# Patient Record
Sex: Female | Born: 1935 | Race: White | Hispanic: No | Smoking: Former smoker
Health system: Southern US, Community
[De-identification: ages and names within clinical notes are randomized; demographics above are authoritative.]

## PROBLEM LIST (undated history)

## (undated) DIAGNOSIS — C50919 Malignant neoplasm of unspecified site of unspecified female breast: Secondary | ICD-10-CM

## (undated) DIAGNOSIS — M1990 Unspecified osteoarthritis, unspecified site: Secondary | ICD-10-CM

## (undated) DIAGNOSIS — K219 Gastro-esophageal reflux disease without esophagitis: Secondary | ICD-10-CM

## (undated) DIAGNOSIS — K589 Irritable bowel syndrome without diarrhea: Secondary | ICD-10-CM

## (undated) DIAGNOSIS — Z9109 Other allergy status, other than to drugs and biological substances: Secondary | ICD-10-CM

## (undated) DIAGNOSIS — Z923 Personal history of irradiation: Secondary | ICD-10-CM

## (undated) DIAGNOSIS — C50912 Malignant neoplasm of unspecified site of left female breast: Secondary | ICD-10-CM

## (undated) DIAGNOSIS — M810 Age-related osteoporosis without current pathological fracture: Secondary | ICD-10-CM

## (undated) DIAGNOSIS — C50511 Malignant neoplasm of lower-outer quadrant of right female breast: Secondary | ICD-10-CM

## (undated) DIAGNOSIS — E039 Hypothyroidism, unspecified: Secondary | ICD-10-CM

## (undated) HISTORY — DX: Age-related osteoporosis without current pathological fracture: M81.0

## (undated) HISTORY — DX: Malignant neoplasm of unspecified site of left female breast: C50.912

## (undated) HISTORY — DX: Malignant neoplasm of lower-outer quadrant of right female breast: C50.511

## (undated) HISTORY — PX: CATARACT EXTRACTION: SUR2

## (undated) HISTORY — DX: Irritable bowel syndrome, unspecified: K58.9

## (undated) HISTORY — PX: JOINT REPLACEMENT: SHX530

## (undated) HISTORY — PX: ABDOMINAL HYSTERECTOMY: SHX81

## (undated) HISTORY — DX: Unspecified osteoarthritis, unspecified site: M19.90

---

## 1999-12-06 HISTORY — PX: OVARY SURGERY: SHX727

## 2010-10-21 ENCOUNTER — Ambulatory Visit: Payer: Self-pay | Admitting: Internal Medicine

## 2011-12-06 DIAGNOSIS — Z923 Personal history of irradiation: Secondary | ICD-10-CM

## 2011-12-06 DIAGNOSIS — C50919 Malignant neoplasm of unspecified site of unspecified female breast: Secondary | ICD-10-CM

## 2011-12-06 HISTORY — DX: Malignant neoplasm of unspecified site of unspecified female breast: C50.919

## 2011-12-06 HISTORY — PX: BREAST SURGERY: SHX581

## 2011-12-06 HISTORY — DX: Personal history of irradiation: Z92.3

## 2011-12-06 HISTORY — PX: BREAST LUMPECTOMY: SHX2

## 2012-01-05 ENCOUNTER — Ambulatory Visit: Payer: Self-pay | Admitting: Ophthalmology

## 2012-01-05 DIAGNOSIS — I499 Cardiac arrhythmia, unspecified: Secondary | ICD-10-CM

## 2012-01-16 ENCOUNTER — Ambulatory Visit: Payer: Self-pay | Admitting: Ophthalmology

## 2012-02-03 ENCOUNTER — Ambulatory Visit: Payer: Self-pay | Admitting: Internal Medicine

## 2012-02-13 ENCOUNTER — Ambulatory Visit: Payer: Self-pay | Admitting: Internal Medicine

## 2012-04-18 ENCOUNTER — Ambulatory Visit: Payer: Self-pay | Admitting: Radiation Oncology

## 2012-05-04 LAB — CBC CANCER CENTER
Basophil %: 0.7 %
HCT: 39.4 % (ref 35.0–47.0)
HGB: 12.8 g/dL (ref 12.0–16.0)
Lymphocyte #: 0.9 x10 3/mm — ABNORMAL LOW (ref 1.0–3.6)
Lymphocyte %: 19.9 %
MCH: 29.2 pg (ref 26.0–34.0)
MCHC: 32.5 g/dL (ref 32.0–36.0)
Monocyte %: 7.8 %
Neutrophil #: 3.2 x10 3/mm (ref 1.4–6.5)
Neutrophil %: 68.1 %
Platelet: 108 x10 3/mm — ABNORMAL LOW (ref 150–440)
RBC: 4.38 10*6/uL (ref 3.80–5.20)

## 2012-05-05 ENCOUNTER — Ambulatory Visit: Payer: Self-pay | Admitting: Radiation Oncology

## 2012-05-11 LAB — CBC CANCER CENTER
Basophil #: 0.1 x10 3/mm (ref 0.0–0.1)
Basophil %: 1.5 %
Eosinophil #: 0.2 x10 3/mm (ref 0.0–0.7)
Eosinophil %: 4.5 %
HCT: 40.8 % (ref 35.0–47.0)
HGB: 13.1 g/dL (ref 12.0–16.0)
Lymphocyte %: 20.9 %
MCHC: 32 g/dL (ref 32.0–36.0)
Monocyte %: 9.7 %
Neutrophil #: 3 x10 3/mm (ref 1.4–6.5)
Platelet: 110 x10 3/mm — ABNORMAL LOW (ref 150–440)

## 2012-05-18 LAB — CBC CANCER CENTER
Basophil #: 0.1 x10 3/mm (ref 0.0–0.1)
HCT: 39.3 % (ref 35.0–47.0)
HGB: 12.9 g/dL (ref 12.0–16.0)
Lymphocyte #: 0.6 x10 3/mm — ABNORMAL LOW (ref 1.0–3.6)
Lymphocyte %: 13.4 %
MCH: 29.5 pg (ref 26.0–34.0)
MCHC: 32.9 g/dL (ref 32.0–36.0)
Neutrophil #: 3.6 x10 3/mm (ref 1.4–6.5)
Neutrophil %: 74.7 %
Platelet: 109 x10 3/mm — ABNORMAL LOW (ref 150–440)
WBC: 4.8 x10 3/mm (ref 3.6–11.0)

## 2012-05-25 LAB — CBC CANCER CENTER
Basophil #: 0.1 x10 3/mm (ref 0.0–0.1)
Basophil %: 1.2 %
Eosinophil %: 2.3 %
Lymphocyte #: 0.9 x10 3/mm — ABNORMAL LOW (ref 1.0–3.6)
Lymphocyte %: 19.6 %
MCH: 28.9 pg (ref 26.0–34.0)
MCHC: 32 g/dL (ref 32.0–36.0)
MCV: 90 fL (ref 80–100)
Monocyte #: 0.4 x10 3/mm (ref 0.2–0.9)
Monocyte %: 9.4 %
Neutrophil #: 3 x10 3/mm (ref 1.4–6.5)
Neutrophil %: 67.5 %
RBC: 4.46 10*6/uL (ref 3.80–5.20)
RDW: 15 % — ABNORMAL HIGH (ref 11.5–14.5)

## 2012-06-01 LAB — CBC CANCER CENTER
Basophil #: 0 x10 3/mm (ref 0.0–0.1)
Eosinophil #: 0.1 x10 3/mm (ref 0.0–0.7)
Eosinophil %: 2.1 %
HCT: 39.7 % (ref 35.0–47.0)
HGB: 12.9 g/dL (ref 12.0–16.0)
Lymphocyte #: 0.6 x10 3/mm — ABNORMAL LOW (ref 1.0–3.6)
Lymphocyte %: 14.8 %
MCH: 29.1 pg (ref 26.0–34.0)
MCHC: 32.5 g/dL (ref 32.0–36.0)
MCV: 90 fL (ref 80–100)
Monocyte #: 0.4 x10 3/mm (ref 0.2–0.9)
Monocyte %: 9 %
Platelet: 101 x10 3/mm — ABNORMAL LOW (ref 150–440)
RBC: 4.43 10*6/uL (ref 3.80–5.20)
RDW: 14.8 % — ABNORMAL HIGH (ref 11.5–14.5)
WBC: 4.1 x10 3/mm (ref 3.6–11.0)

## 2012-06-04 ENCOUNTER — Ambulatory Visit: Payer: Self-pay | Admitting: Radiation Oncology

## 2012-06-08 LAB — CBC CANCER CENTER
Basophil #: 0 x10 3/mm (ref 0.0–0.1)
HCT: 40.2 % (ref 35.0–47.0)
HGB: 12.8 g/dL (ref 12.0–16.0)
Lymphocyte #: 0.6 x10 3/mm — ABNORMAL LOW (ref 1.0–3.6)
MCH: 28.9 pg (ref 26.0–34.0)
MCV: 91 fL (ref 80–100)
Monocyte #: 0.4 x10 3/mm (ref 0.2–0.9)
Neutrophil #: 3.4 x10 3/mm (ref 1.4–6.5)
Neutrophil %: 73.6 %
Platelet: 96 x10 3/mm — ABNORMAL LOW (ref 150–440)
RBC: 4.43 10*6/uL (ref 3.80–5.20)
RDW: 15.3 % — ABNORMAL HIGH (ref 11.5–14.5)
WBC: 4.6 x10 3/mm (ref 3.6–11.0)

## 2012-07-05 ENCOUNTER — Ambulatory Visit: Payer: Self-pay | Admitting: Radiation Oncology

## 2012-08-05 ENCOUNTER — Ambulatory Visit: Payer: Self-pay | Admitting: Oncology

## 2012-09-04 ENCOUNTER — Ambulatory Visit: Payer: Self-pay

## 2012-09-04 ENCOUNTER — Ambulatory Visit: Payer: Self-pay | Admitting: Radiation Oncology

## 2012-11-15 ENCOUNTER — Ambulatory Visit: Payer: Self-pay | Admitting: Oncology

## 2012-11-19 ENCOUNTER — Ambulatory Visit: Payer: Self-pay | Admitting: Oncology

## 2012-11-19 LAB — CBC CANCER CENTER
Basophil #: 0 x10 3/mm (ref 0.0–0.1)
HCT: 38 % (ref 35.0–47.0)
HGB: 12.7 g/dL (ref 12.0–16.0)
Lymphocyte #: 0.7 x10 3/mm — ABNORMAL LOW (ref 1.0–3.6)
Lymphocyte %: 17 %
MCH: 29.6 pg (ref 26.0–34.0)
MCHC: 33.4 g/dL (ref 32.0–36.0)
MCV: 89 fL (ref 80–100)
Monocyte #: 0.3 x10 3/mm (ref 0.2–0.9)
Monocyte %: 6.6 %
Neutrophil #: 3.2 x10 3/mm (ref 1.4–6.5)
Platelet: 150 x10 3/mm (ref 150–440)
RBC: 4.29 10*6/uL (ref 3.80–5.20)
RDW: 14.4 % (ref 11.5–14.5)

## 2012-11-19 LAB — COMPREHENSIVE METABOLIC PANEL
Albumin: 3.6 g/dL (ref 3.4–5.0)
Alkaline Phosphatase: 78 U/L (ref 50–136)
Anion Gap: 9 (ref 7–16)
BUN: 11 mg/dL (ref 7–18)
Bilirubin,Total: 0.3 mg/dL (ref 0.2–1.0)
Calcium, Total: 9.3 mg/dL (ref 8.5–10.1)
Co2: 30 mmol/L (ref 21–32)
Glucose: 113 mg/dL — ABNORMAL HIGH (ref 65–99)
Osmolality: 280 (ref 275–301)
Potassium: 4 mmol/L (ref 3.5–5.1)
SGOT(AST): 16 U/L (ref 15–37)
SGPT (ALT): 21 U/L (ref 12–78)
Total Protein: 7.5 g/dL (ref 6.4–8.2)

## 2012-12-05 ENCOUNTER — Ambulatory Visit: Payer: Self-pay | Admitting: Oncology

## 2013-02-07 ENCOUNTER — Ambulatory Visit: Payer: Self-pay | Admitting: Oncology

## 2013-05-05 ENCOUNTER — Ambulatory Visit: Payer: Self-pay | Admitting: Oncology

## 2013-05-20 LAB — CBC CANCER CENTER
Basophil #: 0 x10 3/mm (ref 0.0–0.1)
Basophil %: 1.2 %
Eosinophil #: 0.1 x10 3/mm (ref 0.0–0.7)
HGB: 13.8 g/dL (ref 12.0–16.0)
Lymphocyte #: 0.8 x10 3/mm — ABNORMAL LOW (ref 1.0–3.6)
MCHC: 33.7 g/dL (ref 32.0–36.0)
Monocyte %: 8.7 %
Neutrophil #: 2.4 x10 3/mm (ref 1.4–6.5)
WBC: 3.6 x10 3/mm (ref 3.6–11.0)

## 2013-05-20 LAB — BASIC METABOLIC PANEL
Anion Gap: 4 — ABNORMAL LOW (ref 7–16)
Calcium, Total: 9.4 mg/dL (ref 8.5–10.1)
Co2: 32 mmol/L (ref 21–32)
EGFR (African American): >60
EGFR (Non-African Amer.): 55 — ABNORMAL LOW
Osmolality: 273 (ref 275–301)
Potassium: 4.3 mmol/L (ref 3.5–5.1)

## 2013-06-04 ENCOUNTER — Ambulatory Visit: Payer: Self-pay | Admitting: Oncology

## 2013-11-12 ENCOUNTER — Ambulatory Visit: Payer: Self-pay | Admitting: Hematology and Oncology

## 2013-11-12 LAB — CBC CANCER CENTER
Basophil #: 0.1 x10 3/mm (ref 0.0–0.1)
Basophil %: 1.1 %
Eosinophil #: 0.2 x10 3/mm (ref 0.0–0.7)
Eosinophil %: 2.4 %
HGB: 13.7 g/dL (ref 12.0–16.0)
MCH: 30.3 pg (ref 26.0–34.0)
MCV: 92 fL (ref 80–100)
Monocyte #: 0.6 x10 3/mm (ref 0.2–0.9)
Monocyte %: 8.4 %
Neutrophil %: 75.6 %
Platelet: 161 x10 3/mm (ref 150–440)
RDW: 14.7 % — ABNORMAL HIGH (ref 11.5–14.5)

## 2013-11-12 LAB — COMPREHENSIVE METABOLIC PANEL
Anion Gap: 7 (ref 7–16)
Bilirubin,Total: 0.3 mg/dL (ref 0.2–1.0)
Calcium, Total: 9.7 mg/dL (ref 8.5–10.1)
EGFR (African American): >60
EGFR (Non-African Amer.): >60
Glucose: 87 mg/dL (ref 65–99)
Potassium: 4.2 mmol/L (ref 3.5–5.1)
SGOT(AST): 12 U/L — ABNORMAL LOW (ref 15–37)

## 2013-12-05 ENCOUNTER — Ambulatory Visit: Payer: Self-pay | Admitting: Hematology and Oncology

## 2014-02-13 ENCOUNTER — Ambulatory Visit: Payer: Self-pay | Admitting: Hematology and Oncology

## 2014-02-13 ENCOUNTER — Ambulatory Visit: Payer: Self-pay | Admitting: Oncology

## 2014-02-14 ENCOUNTER — Ambulatory Visit: Payer: Self-pay | Admitting: Hematology and Oncology

## 2014-02-15 LAB — CANCER ANTIGEN 27.29: CA 27.29: 17.9 U/mL (ref 0.0–38.6)

## 2014-03-05 ENCOUNTER — Ambulatory Visit: Payer: Self-pay | Admitting: Hematology and Oncology

## 2014-08-12 ENCOUNTER — Ambulatory Visit: Payer: Self-pay | Admitting: Ophthalmology

## 2014-08-18 ENCOUNTER — Ambulatory Visit: Payer: Self-pay | Admitting: Ophthalmology

## 2014-09-16 ENCOUNTER — Ambulatory Visit: Payer: Self-pay | Admitting: Oncology

## 2014-09-16 LAB — COMPREHENSIVE METABOLIC PANEL
ALBUMIN: 3.9 g/dL (ref 3.4–5.0)
Alkaline Phosphatase: 71 U/L
Anion Gap: 6 — ABNORMAL LOW (ref 7–16)
BILIRUBIN TOTAL: 0.5 mg/dL (ref 0.2–1.0)
BUN: 16 mg/dL (ref 7–18)
CO2: 31 mmol/L (ref 21–32)
CREATININE: 0.92 mg/dL (ref 0.60–1.30)
Calcium, Total: 10 mg/dL (ref 8.5–10.1)
Chloride: 103 mmol/L (ref 98–107)
EGFR (African American): >60
EGFR (Non-African Amer.): >60
GLUCOSE: 94 mg/dL (ref 65–99)
OSMOLALITY: 280 (ref 275–301)
Potassium: 4.7 mmol/L (ref 3.5–5.1)
SGOT(AST): 21 U/L (ref 15–37)
SGPT (ALT): 30 U/L
Sodium: 140 mmol/L (ref 136–145)
Total Protein: 7.3 g/dL (ref 6.4–8.2)

## 2014-09-16 LAB — CBC CANCER CENTER
BASOS ABS: 0.1 x10 3/mm (ref 0.0–0.1)
Basophil %: 2.4 %
EOS ABS: 0.1 x10 3/mm (ref 0.0–0.7)
Eosinophil %: 3 %
HCT: 41.7 % (ref 35.0–47.0)
HGB: 13.5 g/dL (ref 12.0–16.0)
LYMPHS ABS: 0.9 x10 3/mm — AB (ref 1.0–3.6)
LYMPHS PCT: 23.2 %
MCH: 29.8 pg (ref 26.0–34.0)
MCHC: 32.3 g/dL (ref 32.0–36.0)
MCV: 92 fL (ref 80–100)
MONO ABS: 0.3 x10 3/mm (ref 0.2–0.9)
MONOS PCT: 8.4 %
Neutrophil #: 2.5 x10 3/mm (ref 1.4–6.5)
Neutrophil %: 63 %
Platelet: 130 x10 3/mm — ABNORMAL LOW (ref 150–440)
RBC: 4.52 10*6/uL (ref 3.80–5.20)
RDW: 14.4 % (ref 11.5–14.5)
WBC: 3.9 x10 3/mm (ref 3.6–11.0)

## 2014-09-17 LAB — CANCER ANTIGEN 27.29: CA 27.29: 10.3 U/mL (ref 0.0–38.6)

## 2014-10-05 ENCOUNTER — Ambulatory Visit: Payer: Self-pay | Admitting: Oncology

## 2014-12-05 HISTORY — PX: BREAST LUMPECTOMY: SHX2

## 2015-02-16 ENCOUNTER — Ambulatory Visit: Payer: Self-pay | Admitting: Oncology

## 2015-02-17 ENCOUNTER — Ambulatory Visit: Payer: Self-pay | Admitting: Oncology

## 2015-02-17 HISTORY — PX: BREAST BIOPSY: SHX20

## 2015-02-23 ENCOUNTER — Ambulatory Visit
Admit: 2015-02-23 | Disposition: A | Discharge status: Home / Self Care | Payer: Self-pay | Attending: Oncology | Admitting: Oncology

## 2015-02-24 ENCOUNTER — Encounter: Payer: Self-pay | Admitting: *Deleted

## 2015-02-26 ENCOUNTER — Other Ambulatory Visit: Payer: Medicare Other

## 2015-02-26 ENCOUNTER — Encounter: Payer: Self-pay | Admitting: General Surgery

## 2015-02-26 ENCOUNTER — Ambulatory Visit (INDEPENDENT_AMBULATORY_CARE_PROVIDER_SITE_OTHER): Payer: Medicare Other | Admitting: General Surgery

## 2015-02-26 ENCOUNTER — Other Ambulatory Visit: Payer: Self-pay | Admitting: General Surgery

## 2015-02-26 VITALS — BP 124/70 | HR 70 | Resp 12 | Ht 66.0 in | Wt 142.0 lb

## 2015-02-26 DIAGNOSIS — D0511 Intraductal carcinoma in situ of right breast: Secondary | ICD-10-CM

## 2015-02-26 DIAGNOSIS — N6310 Unspecified lump in the right breast, unspecified quadrant: Secondary | ICD-10-CM

## 2015-02-26 DIAGNOSIS — D0510 Intraductal carcinoma in situ of unspecified breast: Secondary | ICD-10-CM | POA: Insufficient documentation

## 2015-02-26 DIAGNOSIS — C50912 Malignant neoplasm of unspecified site of left female breast: Secondary | ICD-10-CM | POA: Insufficient documentation

## 2015-02-26 NOTE — Progress Notes (Signed)
Patient ID: Aimee Gutierrez, female   DOB: 06-20-1933, 81 y.o.   MRN: [id number]  Chief Complaint  Patient presents with  . Other    breast cancer    HPI Aimee Gutierrez is a 81 y.o. female who presents for a breast evaluation for a newly diagnosis of breast cancer. The most recent mammogram was done on 02-16-15  and a right breast biopsy was done on 02-17-15.  Patient does perform regular self breast checks and gets regular mammograms done.  She has a history of left breast cancer in 2103 with lumpectomy and radiation. She did not feel anything different in the breast prior to her mammogram.  She is here today with her husband of 47 years.   HPI  Past Medical History  Diagnosis Date  . Arthritis   . IBS (irritable bowel syndrome)   . Cancer 2013    with radiation, stage 1    Past Surgical History  Procedure Laterality Date  . Breast surgery Left 2013    [place] extraction  2013, 2015  . Ovary surgery  2001    benign    No family history on file.  Social History History  Substance Use Topics  . Smoking status: Former Smoker    Quit date: 12/05/2009  . Smokeless tobacco: Not on file  . Alcohol Use: No    Allergies  Allergen Reactions  . Sulfa Antibiotics Rash    Current Outpatient Prescriptions  Medication Sig Dispense Refill  . aspirin 81 MG tablet Take 81 mg by mouth daily.    . calcium carbonate (TUMS - DOSED IN MG ELEMENTAL CALCIUM) 500 MG chewable tablet Chew 1 tablet by mouth daily.    . Cholecalciferol (VITAMIN D3) 1000 UNITS CAPS Take by mouth daily.     Marland Kitchen ibuprofen (ADVIL,MOTRIN) 200 MG tablet Take by mouth.    . Probiotic Product (HEALTHY COLON PO) Take by mouth daily.    Marland Kitchen SYNTHROID 75 MCG tablet      No current facility-administered medications for this visit.    Review of Systems Review of Systems  Constitutional: Negative.   Respiratory: Negative.   Cardiovascular: Negative.     Blood pressure 124/70, pulse 70, resp. rate  12, height $RemoveBe'5\' 6"'XuBjwhnRq$  (1.676 m), weight 142 lb (64.411 kg).  Physical Exam Physical Exam  Constitutional: She is oriented to person, place, and time. She appears well-developed and well-nourished.  Neck: Neck supple.  Cardiovascular: Normal rate, regular rhythm and normal heart sounds.   Pulmonary/Chest: Effort normal and breath sounds normal. Right breast exhibits no inverted nipple, no nipple discharge, no skin change and no tenderness. Left breast exhibits no inverted nipple, no mass, no nipple discharge, no skin change and no tenderness.    Lymphadenopathy:    She has no cervical adenopathy.    She has no axillary adenopathy.  Neurological: She is alert and oriented to person, place, and time.  Skin: Skin is warm and dry.    Data Reviewed [place] [facility] dated 02/23/2012 on a left breast needle biopsy showed invasive mammary carcinoma as well as DCIS. Subsequent wide excision completed 03/09/2012 showed a 1 mm invasive carcinoma, ER positive, PR positive. HER-2/neu deferred due to size. Sentinel nodes 2 negative for malignancy.  Recently completed a right breast ultrasound-guided biopsy showed low-grade DCIS, ER 90%, PR 90%. Diagnostic mammograms dated 02/16/2015 suggested a density in the lower quadrant of the right breast. Otherwise negative exam. Ultrasound of the area showed a 2  x 3 x 5 mm oval hypoechoic area that was slightly irregular.  Subsequent ultrasound-guided biopsy dated 02/17/2015 was completed showing DCIS.  Laboratory studies dated 02/23/2015 showed a hemoglobin of 13.5 with an MCV of 89, white blood cell count 4300. Modestly depressed platelet count of 119,000. CA 27-29 normal at 11.4. Comprehensive metabolic panel unremarkable. Normal electrolytes with the exception of a modestly depressed sodium at 134 and chloride at 99. Creatinine 0.8.  Ultrasound examination of the right breast to determine visibility of the biopsy site was completed today. There is a vaguely defined area at  the 4:00 position, 5 cm from the nipple measuring less than 0.5 cm in diameter with posterior acoustic enhancement corresponding to the prebiopsy ultrasound.  Assessment    Low-grade DCIS involving the right breast, ER/PR positive.    Plan    The difference between DCIS and invasive cancer was reviewed, in particular that there is no need to examine lymph glands as was done for her left breast.  She reported that the radiation therapy really drained her, and she would avoid radiation therapy if possible. There is new data that for women in their late 70s with very favorable DCIS, as is the case at this time, that the advantage of radiation may be in the low single digits if the patient does make use of antiestrogen therapy. The pros and cons will be discussed in more detail when the wide excision is completed and confirmation that there is no invasive component is confirmed.  The patient reports that she developed a hematoma after her 2013 exam. A compressive wrap was not utilized. This will be planned at the time of her wide excision. Patient's surgery has been scheduled for 03-09-15 at [facility]. It is okay for patient to continue 81 mg aspirin once daily.     PCP:  Adin Hector Ref: Dr. Luna Glasgow, Kelvin Cellar 02/26/2015, 12:53 PM

## 2015-02-26 NOTE — Patient Instructions (Signed)
Patient's surgery has been scheduled for 03-09-15 at [facility]. It is okay for patient to continue 81 mg aspirin once daily.

## 2015-03-02 ENCOUNTER — Ambulatory Visit: Payer: Self-pay | Admitting: General Surgery

## 2015-03-04 ENCOUNTER — Ambulatory Visit: Payer: Self-pay | Admitting: General Surgery

## 2015-03-06 ENCOUNTER — Ambulatory Visit
Admit: 2015-03-06 | Disposition: A | Discharge status: Home / Self Care | Payer: Self-pay | Attending: Oncology | Admitting: Oncology

## 2015-03-09 ENCOUNTER — Ambulatory Visit
Admit: 2015-03-09 | Disposition: A | Discharge status: Home / Self Care | Payer: Self-pay | Attending: General Surgery | Admitting: General Surgery

## 2015-03-09 ENCOUNTER — Ambulatory Visit: Payer: Medicare Other | Admitting: General Surgery

## 2015-03-09 DIAGNOSIS — C50511 Malignant neoplasm of lower-outer quadrant of right female breast: Secondary | ICD-10-CM

## 2015-03-09 DIAGNOSIS — C50912 Malignant neoplasm of unspecified site of left female breast: Secondary | ICD-10-CM

## 2015-03-09 HISTORY — PX: BREAST SURGERY: SHX581

## 2015-03-09 HISTORY — DX: Malignant neoplasm of lower-outer quadrant of right female breast: C50.511

## 2015-03-11 ENCOUNTER — Telehealth: Payer: Self-pay | Admitting: General Surgery

## 2015-03-11 ENCOUNTER — Encounter: Payer: Self-pay | Admitting: General Surgery

## 2015-03-11 NOTE — Telephone Encounter (Signed)
Notified of path: 3. 5 mm invasive mammary cancer noted. Will review at office visit.

## 2015-03-12 ENCOUNTER — Encounter: Payer: Self-pay | Admitting: General Surgery

## 2015-03-12 ENCOUNTER — Ambulatory Visit (INDEPENDENT_AMBULATORY_CARE_PROVIDER_SITE_OTHER): Payer: Medicare Other | Admitting: General Surgery

## 2015-03-12 VITALS — BP 116/68 | HR 68 | Resp 12 | Ht 66.0 in | Wt 130.0 lb

## 2015-03-12 DIAGNOSIS — C50912 Malignant neoplasm of unspecified site of left female breast: Secondary | ICD-10-CM

## 2015-03-12 DIAGNOSIS — C50911 Malignant neoplasm of unspecified site of right female breast: Secondary | ICD-10-CM

## 2015-03-12 DIAGNOSIS — D0511 Intraductal carcinoma in situ of right breast: Secondary | ICD-10-CM

## 2015-03-12 NOTE — Progress Notes (Signed)
Patient ID: Aimee Gutierrez, female   DOB: 02/20/1933, 81 y.o.   MRN: [id number]  Chief Complaint  Patient presents with  . Routine Post Op    right breast wide excision     HPI Aimee Gutierrez is a 81 y.o. female here today for her post op right breast wide excision done on 03/09/15. Patient states she is doing well at this time . The patient had been contacted by phone with results of her biopsy showing a 3.5 mm foci of invasive ductal carcinoma. She was accompanied today for the post exam interview by her husband, son and daughter. HPI  Past Medical History  Diagnosis Date  . Arthritis   . IBS (irritable bowel syndrome)   . Cancer 2013    with radiation, stage 1    Past Surgical History  Procedure Laterality Date  . Cataract extraction  2013, 2015  . Ovary surgery  2001    benign  . Breast surgery Left 2013    [facility]  . Breast surgery Right 03/09/15    wide excision    No family history on file.  Social History History  Substance Use Topics  . Smoking status: Former Smoker    Quit date: 12/05/2009  . Smokeless tobacco: Not on file  . Alcohol Use: No    Allergies  Allergen Reactions  . Sulfa Antibiotics Rash    Current Outpatient Prescriptions  Medication Sig Dispense Refill  . aspirin 81 MG tablet Take 81 mg by mouth daily.    . calcium carbonate (TUMS - DOSED IN MG ELEMENTAL CALCIUM) 500 MG chewable tablet Chew 1 tablet by mouth daily.    . Cholecalciferol (VITAMIN D3) 1000 UNITS CAPS Take by mouth daily.     Marland Kitchen ibuprofen (ADVIL,MOTRIN) 200 MG tablet Take by mouth.    . Probiotic Product (HEALTHY COLON PO) Take by mouth daily.    Marland Kitchen SYNTHROID 75 MCG tablet      No current facility-administered medications for this visit.    Review of Systems Review of Systems  Constitutional: Negative.   Respiratory: Negative.   Cardiovascular: Negative.     Blood pressure 116/68, pulse 68, resp. rate 12, height 5\' 6"  (1.676 m), weight 130 lb (58.968 kg).  Physical  Exam Physical Exam  Constitutional: She is oriented to person, place, and time. She appears well-developed and well-nourished.  Eyes: Conjunctivae are normal. No scleral icterus.  Neck: Neck supple.  Cardiovascular: Normal rate, regular rhythm and normal heart sounds.   Pulmonary/Chest:    Right breast incision is clean and healing well.   Lymphadenopathy:    She has no cervical adenopathy.  Neurological: She is alert and oriented to person, place, and time.  Skin: Skin is warm and dry.    Data Reviewed Pathology showed all margins clear. 3.5 mm invasive mammary carcinoma as incidental finding.  Assessment    Stage I, T1a carcinoma of the right breast    Plan    This patient is scheduled to see Dr. Mike Gip and Dr. Baruch Gouty at the [facility] on Wednesday, 03-18-15 at 9 am.  The patient had been reluctant to consider radiation therapy, attributing multiple arthralgias and myalgias to this 3 years ago when she was treated for right breast. We clarified that she did very well with the actual breast radiation and its highly unlikely that she was experiencing lower extremity arthralgias from left chest wall radiation.  She is ambivalent about radiation therapy, and considering her age I  can understand this. She does seem to have a fairly good 8-10 year life expectancy. She declined antiestrogen therapy 3 years ago, and I strongly encouraged her to reassess this decision now. She is afraid about myalgias, and this is certainly a common side effect with aromatase inhibitors. If she failed this she could be considered for tamoxifen therapy recognizing that there is a small risk of DVT not present with aromatase inhibitors.  The patient has been encouraged to make use of a bra day and night or support. We'll plan for reassessment and review of the radiation and medical oncology physicians consultation at her follow-up visit.     PCP:  Arther Dames, Trecia Rogers 03/13/2015, 9:21 PM

## 2015-03-12 NOTE — Patient Instructions (Addendum)
Patient to return in one week.   This patient is scheduled to see Dr. Mike Gip and Dr. Baruch Gouty at the [facility] on Wednesday, 03-18-15 at 9 am.

## 2015-03-13 DIAGNOSIS — C50911 Malignant neoplasm of unspecified site of right female breast: Secondary | ICD-10-CM | POA: Insufficient documentation

## 2015-03-18 ENCOUNTER — Encounter: Payer: Self-pay | Admitting: General Surgery

## 2015-03-18 ENCOUNTER — Ambulatory Visit (INDEPENDENT_AMBULATORY_CARE_PROVIDER_SITE_OTHER): Payer: Medicare Other | Admitting: General Surgery

## 2015-03-18 VITALS — BP 120/66 | HR 90 | Resp 16 | Ht 66.0 in | Wt 139.0 lb

## 2015-03-18 DIAGNOSIS — C50911 Malignant neoplasm of unspecified site of right female breast: Secondary | ICD-10-CM

## 2015-03-18 DIAGNOSIS — D0511 Intraductal carcinoma in situ of right breast: Secondary | ICD-10-CM

## 2015-03-18 NOTE — Patient Instructions (Addendum)
The patient is aware to call back for any questions or concerns. Continue self breast exams. Call office for any new breast issues or concerns. Follow up 2 months. Gradually increase physical activity.

## 2015-03-18 NOTE — Progress Notes (Signed)
Patient ID: Aimee Gutierrez, female   DOB: 07/19/1933, 81 y.o.   MRN: [id number]  Chief Complaint  Patient presents with  . Routine Post Op    right breast wide excision    HPI Aimee Gutierrez is a 81 y.o. female.  here today for her post op right breast wide excision done on 03/09/15. Patient states she is doing well at this time. Since her last visit she did have a chance to meet with radiation oncology, but has yet to touch base with the medical oncologist. An appointment with Melissa Corcoran, M.D. is scheduled for next week.  She reports that her last bone density showed a value of -3.  She has an appointment for radiation simulation on 03-30-15.   HPI  Past Medical History  Diagnosis Date  . Arthritis   . IBS (irritable bowel syndrome)   . Cancer 2013    with radiation, stage 1  . Breast cancer of lower-inner quadrant of right female breast 03/09/2015    3.5 mm invasive carcinoma, low-grade DCIS. ER 90%, PR greater than 50%, HER-2/neu not amplified.    Past Surgical History  Procedure Laterality Date  . Cataract extraction  2013, 2015  . Ovary surgery  2001    benign  . Breast surgery Left 2013    [facility]  . Breast surgery Right 03/09/15    wide excision    No family history on file.  Social History History  Substance Use Topics  . Smoking status: Former Smoker    Quit date: 12/05/2009  . Smokeless tobacco: Not on file  . Alcohol Use: No    Allergies  Allergen Reactions  . Sulfa Antibiotics Rash    Current Outpatient Prescriptions  Medication Sig Dispense Refill  . aspirin 81 MG tablet Take 81 mg by mouth daily.    . calcium carbonate (TUMS - DOSED IN MG ELEMENTAL CALCIUM) 500 MG chewable tablet Chew 1 tablet by mouth daily.    . Cholecalciferol (VITAMIN D3) 1000 UNITS CAPS Take by mouth daily.     . ibuprofen (ADVIL,MOTRIN) 200 MG tablet Take by mouth.    . Probiotic Product (HEALTHY COLON PO) Take by mouth daily.    . SYNTHROID 75 MCG tablet      No  current facility-administered medications for this visit.    Review of Systems Review of Systems  Blood pressure 120/66, pulse 90, resp. rate 16, height 5' 6" (1.676 m), weight 139 lb (63.05 kg).  Physical Exam Physical Exam  Constitutional: She is oriented to person, place, and time. She appears well-developed and well-nourished.  Pulmonary/Chest:    Well healed incision right breast with minimal bruising.  Neurological: She is alert and oriented to person, place, and time.  Skin: Skin is warm and dry.    Data Reviewed T1a, Nx carcinoma of the right breast.  Assessment    Doing well status post wide excision.     Plan    The only question as well as the patient would benefit from a sentinel node biopsy. The likelihood of axillary metastatic disease with a 3.5 mm primary is approximately 5%. The patient is going to be receiving whole breast radiation, and I find it unlikely that adjuvant chemotherapy would be recommended if a positive sentinel node was identified.  Will await formal consultation from Melissa Corcoran, M.D. in this regard.  The patient has artery been identified with osteoporosis, but has been resistant to making use of pharmacologic measures to improve her   density. She is using calcium supplements. Ideally, she would be treated with an aromatase inhibitor after radiation therapy. This may accelerate her bone loss. She had been offered the use of Fosamax in the past by her primary physician, but had declined. She has been asked to reconsider this decision. Certainly, she could be treated with tamoxifen which does support her bone density without the need for an additional medication. The slightly lower efficacy of tamoxifen as opposed to Femara or one of the other AI agents was discussed.  If node biopsy is not required, we'll arrange for follow-up examination in 2 months which should be just about the time she completes her radiation therapy.    Follow up 2  months. Gradually increase physical activity.      PCP:  Klein III, Bert  Byrnett, Jeffrey W 03/21/2015, 11:11 AM    

## 2015-03-21 ENCOUNTER — Encounter: Payer: Self-pay | Admitting: General Surgery

## 2015-03-24 ENCOUNTER — Ambulatory Visit: Payer: Medicare Other | Admitting: General Surgery

## 2015-03-28 NOTE — Op Note (Signed)
PATIENT NAME:  Aimee Gutierrez, Aimee Gutierrez MR#:  [id number] DATE OF BIRTH:  May 13, 1933  DATE OF PROCEDURE:  08/18/2014  PREOPERATIVE DIAGNOSIS: Cataract right eye.   POSTOPERATIVE DIAGNOSIS: Cataract right eye.   PROCEDURE PERFORMED: Extracapsular cataract extraction using phacoemulsification with placement Alcon SN6CWS 23.5 diopter posterior chamber lens, serial number [id number].   ANESTHESIA: 4% lidocaine and 0.75% Marcaine, a 50-50 mixture with 10 units/mL of Hylenex added given as a peribulbar.   ANESTHESIOLOGIST: Dr. Carolin Sicks.   COMPLICATIONS: None.   ESTIMATED BLOOD LOSS: Less than 1 mL.   DESCRIPTION OF PROCEDURE:  The patient was brought to the operating room and given a peribulbar block.  The patient was then prepped and draped in the usual fashion.  The vertical rectus muscles were imbricated using 5-0 silk sutures.  These sutures were then clamped to the sterile drapes as bridle sutures.  A limbal peritomy was performed extending two clock hours and hemostasis was obtained with cautery.  A partial thickness scleral groove was made at the surgical limbus and dissected anteriorly in a lamellar dissection using an Alcon crescent knife.  The anterior chamber was entered superonasally with a Superblade and through the lamellar dissection with a 2.6 mm keratome.  DisCoVisc was used to replace the aqueous and a continuous tear capsulorrhexis was carried out.  Hydrodissection and hydrodelineation were carried out with balanced salt and a 27 gauge canula.  The nucleus was rotated to confirm the effectiveness of the hydrodissection.  Phacoemulsification was carried out using a divide-and-conquer technique.  Total ultrasound time was 1 minute, 41 seconds with an average power of 26.7 percent. CD of 42.07.  Irrigation/aspiration was used to remove the residual cortex.  DisCoVisc was used to inflate the capsule and the internal incision was enlarged to 3 mm with the crescent knife.  The intraocular lens  was folded and inserted into the capsular bag using the AcrySert delivery system.  Irrigation/aspiration was used to remove the residual DisCoVisc.  Miostat was injected into the anterior chamber through the paracentesis track to inflate the anterior chamber and induce miosis.  0.1 mL cefuroxime was injected via the paracentesis tract containing 1 mg of drug. The wound was checked for leaks and none were found. The conjunctiva was closed with cautery and the bridle sutures were removed.  Two drops of 0.3% Vigamox were placed on the eye.   An eye shield was placed on the eye.   ____________________________ Loura Back. Kenidi Elenbaas, MD sad:bu D: 08/18/2014 14:15:16 ET T: 08/18/2014 14:38:51 ET JOB#: [id number]  cc: Remo Lipps A. Dwanna Goshert, MD, <Dictator> Martie Lee MD ELECTRONICALLY SIGNED 08/25/2014 13:59

## 2015-03-29 NOTE — Op Note (Signed)
PATIENT NAME:  Aimee Gutierrez, Aimee Gutierrez MR#:  [id number] DATE OF BIRTH:  [birth year]  DATE OF PROCEDURE:  01/16/2012  PREOPERATIVE DIAGNOSIS:  Cataract, left eye.    POSTOPERATIVE DIAGNOSIS:  Cataract, left eye.  PROCEDURE PERFORMED:  Extracapsular cataract extraction using phacoemulsification with placement of an Alcon SN6CWS 23.5-diopter posterior chamber lens, serial # [id number].  SURGEON:  Loura Back. Charnay Nazario, MD  ASSISTANT:  None.  ANESTHESIA:  4% lidocaine and 0.75% Marcaine in a 50/50 mixture with 10 units/mL of Hylenex added, given as a peribulbar.  ANESTHESIOLOGIST:  Dr. Benjamine Mola.   COMPLICATIONS:  None.  ESTIMATED BLOOD LOSS:  Less than 1 mL.  DESCRIPTION OF PROCEDURE:  The patient was brought to the operating room and given a peribulbar block.  The patient was then prepped and draped in the usual fashion.  The vertical rectus muscles were imbricated using 5-0 silk sutures.  These sutures were then clamped to the sterile drapes as bridle sutures.  A limbal peritomy was performed extending two clock hours and hemostasis was obtained with cautery.  A partial thickness scleral groove was made at the surgical limbus and dissected anteriorly in a lamellar dissection using an Alcon crescent knife.  The anterior chamber was entered supero-temporally with a Superblade and through the lamellar dissection with a 2.6 mm keratome.  DisCoVisc was used to replace the aqueous and a continuous tear capsulorrhexis was carried out.  Hydrodissection and hydrodelineation were carried out with balanced salt and a 27 gauge canula.  The nucleus was rotated to confirm the effectiveness of the hydrodissection.  Phacoemulsification was carried out using a divide-and-conquer technique.  Total ultrasound time was 1 minute and 39 seconds with an average power of 22.8 percent.  Irrigation/aspiration was used to remove the residual cortex.  DisCoVisc was used to inflate the capsule and the internal incision was enlarged  to 3 mm with the crescent knife.  The intraocular lens was folded and inserted into the capsular bag using the AcrySert delivery system.  Irrigation/aspiration was used to remove the residual DisCoVisc.  Miostat was injected into the anterior chamber through the paracentesis track to inflate the anterior chamber and induce miosis.  The wound was checked for leaks and none were found. The conjunctiva was closed with cautery and the bridle sutures were removed.  Two drops of 0.3% Vigamox were placed on the eye.   An eye shield was placed on the eye.  The patient was discharged to the recovery room in good condition.  ____________________________ Loura Back Add Dinapoli, MD sad:cms D: 01/16/2012 13:05:18 ET T: 01/16/2012 13:43:12 ET JOB#: [id number]  cc: Remo Lipps A. Carlyle Mcelrath, MD, <Dictator> Martie Lee MD ELECTRONICALLY SIGNED 01/23/2012 13:31

## 2015-03-29 NOTE — Consult Note (Signed)
Reason for Visit: This 81 year old Female patient presents to the clinic for initial evaluation of  Breast cancer .   Referred by Dr. Collier Bullock, [facility].  Diagnosis:   Chief Complaint/Diagnosis   81 year old female with wide local excision and sentinel node biopsy for a pathologic stage I (T1 A. N0 M0) invasive mammary carcinoma ER/PR positive HER-2/neu not performed   Pathology Report Pathology report reviewed    Imaging Report Mammograms reviewed    Planned Treatment Regimen Adjuvant radiation therapy plus aromatase inhibitor    HPI   patient is a 81 year old female who presents with an abnormal mammogram showing a 7 mm solid lesion in the left breast. Core biopsy was performed showing ER/PR positive invasive mammary carcinoma. She underwent wide local excision with less than 1 mm focus of residual invasive mammary carcinoma. There was ductal carcinoma in situ present. Tumor was overall grade 1. 2 sentinel lymph nodes were examined and negative for metastatic disease. Tumor again was ER/PR positive HER-2/neu not tested based on the small tissue sample. She been seen by medical oncology and recommendation for postop radiation therapy was made. There also considering aromatase inhibitor after completion of radiation. She is presently doing well. She is healed well from her incision. She is having no breast tenderness cough or bone pain at this time.d  Past Hx:    Hypothyroidism: Nov 2011   Cataracts: Feb 2013   Arthritis: 2011   Lumpectomy: 09-Mar-2012   Hysterectomy - Partial: 1969   Hysterectomy - Total: 2001   Cataract Extraction: Feb 2013  Past, Family and Social History:   Past Medical History positive    Gastrointestinal Diverticulosis    Endocrine hypothyroidism    Past Surgical History Hysterectomy, cataract excision    Family History positive    Family History Comments Father with cardiovascular heart disease , mother with history of  heart disease sister with a history of dementia, brother with a history of heart disease    Social History noncontributory    Additional Past Medical and Surgical History Seen accompanied by nurse navigator today   Allergies:   Sulfa drugs: Unknown  Home Meds:  Home Medications: Medication Instructions Status  Synthroid 50 mcg (0.05 mg) oral tablet 1 tab(s) orally once a day (in the morning) Active  Vitamin D3 1000 intl units oral capsule 1 cap(s) orally once a day (in the morning) Active  aspirin 81 mg oral tablet 1 tab(s) orally once a day 2-3 days a week Active  Pepcid Complete oral tablet, chewable 1 tab(s) orally every 12 hours - takes 1-2 times a week Active   Review of Systems:   General negative    Performance Status (ECOG) 0    Skin negative    Breast see HPI    Ophthalmologic negative    ENMT negative    Respiratory and Thorax negative    Cardiovascular negative    Gastrointestinal negative    Genitourinary negative    Musculoskeletal negative    Neurological negative    Psychiatric negative    Hematology/Lymphatics negative    Endocrine negative    Allergic/Immunologic negative   Nursing Notes:  Nursing Vital Signs and Chemo Nursing Nursing Notes: *CC Vital Signs Flowsheet:   15-May-13 11:14   Temp Temperature 96.2   Pulse Pulse 71   Respirations Respirations 18   DBP DBP 82   Pain Scale (0-10)  0   Current Weight (kg) (kg) 63.8   Physical Exam:  General/Skin/HEENT:  General normal    Skin normal    Eyes normal    ENMT normal    Head and Neck normal    Additional PE Well-developed well-nourished female in NAD. Lungs are clear to A&P cardiac examination shows regular rate and rhythm. Left breast as well and healed wide local excision site. No dominant mass or nodularity is noted in either breast into position examined. No axillary or supraclavicular adenopathy is identified.   Breasts/Resp/CV/GI/GU:   Respiratory and Thorax  normal    Cardiovascular normal    Gastrointestinal normal    Genitourinary normal   MS/Neuro/Psych/Lymph:   Musculoskeletal normal    Neurological normal    Lymphatics normal   Other Results:  Radiology Results: [place]:    11-Mar-13 11:43, US Breast Left   US Breast Left    REASON FOR EXAM:    AV LT NODULAR DENSITY  COMMENTS:       PROCEDURE: [place]  - US BREAST LEFT  - Feb 13 2012 11:43AM     RESULT:  7 mm irregular solid nodule is noted in the inferior aspect of   the left breast.  Vascular flow is noted. This may be a small malignancy   and surgical consultation is suggested.    IMPRESSION:     BIRADS:   Category 4-Suspicious Abnormality       A negative mammogram report does not preclude biopsy or other evaluation     of a clinically palpable or otherwise suspiciousmass or lesion.  Breast   cancer may not be detected by mammography in up to 10% of cases.     Thank you for the opportunity to contribute to the care of your patient.         Verified By: Osa Craver, M.D., MD  [facility]:    11-Mar-13 10:30, Digital Additional Views Lt Breast [facility])   Digital Additional Views Lt Breast (SCR)    REASON FOR EXAM:    AV LT NODULAR DENSITY  COMMENTS:       PROCEDURE: MAM - MAM DGTL ADD VW LT  SCR  - Feb 13 2012 10:30AM       RESULT: Additional views of the left breast reveal a nodular density in   the inferior aspect of the left breast measuring 7 mm. Ultrasound was   performed and reveals a 7 mm slightly irregular nodular density. This   appears solid. Malignancy should be considered and surgical evaluation is   suggested.     IMPRESSION:    7 mm solid lesion for which surgical consultation suggested.  BI-RADS: Category 4 - Suspicious Abnormality.      A NEGATIVE MAMMOGRAM REPORT DOES NOT PRECLUDE BIOPSY OR OTHER EVALUATION   OF A CLINICALLY PALPABLE OR OTHERWISE SUSPICIOUS MASS OR LESION. BREAST   CANCER MAY NOT BE DETECTED BY MAMMOGRAPHY IN UP TO  10% OF CASES.      Thank you for the opportunity to contribute to the care of your patient.           Verified By: Osa Craver, M.D., MD   Assessment and Plan:  Impression:   pathologic stage I invasive mammary carcinoma the left breast is post wide local excision and sentinel node biopsy ER/PR positive HER-2/neu not tested.  Plan:   this time I had a long discussion with the patient and her daughter via conference call. My recommendation would be standard of care to treat her whole breast up to 5000 cGy over 5  weeks. Would also boost or scar site another 1400 cGy based on close margin. Risks and benefits of treatment including redness of the skin, some fatigue, and slight involvement of superficial lung were all explained in detail to the patient. Patient's daughter has question recent studies showing knee for radiation therapy in elderly women and I assured her this is not standard of care and only discussed survival not recurrence of disease. Certainly a 81 year old female would not be discussing survival advantage to radiation and concentrate more on local regional recurrence. Would also recommend aromatase inhibitor after completion of radiation therapy and we will leave that to her medical oncologist Dr. Elizabeth Palau for that. I have set her up for CT simulation tomorrow.  I would like to take this opportunity to thank you for allowing me to continue to participate in this patient's care.  CC Referral:   cc: Dr. Collier Bullock [facility], Dr. Ramonita Lab   Electronic Signatures: Baruch Gouty, Roda Shutters (MD)  (Signed 15-May-13 12:49)  Authored: HPI, Diagnosis, Past Hx, PFSH, Allergies, Home Meds, ROS, Nursing Notes, Physical Exam, Other Results, Encounter Assessment and Plan, CC Referring Physician   Last Updated: 15-May-13 12:49 by Armstead Peaks (MD)

## 2015-03-30 LAB — SURGICAL PATHOLOGY

## 2015-04-03 ENCOUNTER — Other Ambulatory Visit: Payer: Self-pay | Admitting: *Deleted

## 2015-04-03 DIAGNOSIS — D0591 Unspecified type of carcinoma in situ of right breast: Secondary | ICD-10-CM

## 2015-04-05 NOTE — Consult Note (Signed)
Reason for Visit: This 81 year old Female patient presents to the clinic for initial evaluation of  breast cancer .   Referred by Dr. Bary Castilla.  Diagnosis:  Chief Complaint/Diagnosis   81 year old female status post wide local excision and radiation therapy to her left breast in 2013 now with a stage IA (T1 1 N0M0) ER/PR positive invasive mammary carcinoma status post wide local excision.  Pathology Report pathology report reviewed   Imaging Report mammograms reviewed   Referral Report clinical notes reviewed   Planned Treatment Regimen whole breast radiation therapyplus aromatase inhibitor therapy   HPI   patient is a 81 year old female well known to our department haven't completed radiation therapy to her left breast for 7 mm invasive mammary carcinoma ER/PR positive back in 2013. She recently presented with an abnormal mammogram of the right breast on 02/16/2015 showing a 5 mm hypoechoic mass at the 4:00 position of the right breast 5 cm from the nipple with core biopsy showing low-grade ductal carcinoma in situ. Tumor was ER/PR positive. She went on to have a wide local excision for 3.5 mm invasive mammary carcinoma overall grade 2 with margins close but clear at 4 mm. No sentinel lymph node was identified. Patient also had mammoplasty for correction of the lumpectomy defect. She's tolerated her surgery well. She is now referred to radiation oncology for consideration of treatment. She is or to been seen by medical oncology. She is doing well she remembers radiation therapy to her left breast causing significant fatigue no other major side effects.She is seen today for radiation oncology opinion.of note patient was offered aromatase inhibitor therapy after her last bout of left-sided breast cancer and she refused.  Past Hx:    Thrombocytopenia (mild):    Left Breast Cancer:    Hypothyroidism: Nov 2011   Cataracts: Feb 2013   Arthritis: 2011   Right breast biopsy: 17-Feb-2015    Lumpectomy: 09-Mar-2012   Hysterectomy - Partial: 1969   Hysterectomy - Total: 2001   Cataract Extraction: Feb 2013  Past, Family and Social History:  Past Medical History positive   Endocrine hypothyroidism   Past Surgical History cataract extraction, total hysterectomy   Past Medical History Comments mild thrombocytopenia, arthritis   Family History noncontributory   Social History positive   Social History Comments 30-pack-year smoking history stop smoking to half years prior   Additional Past Medical and Surgical History seen accompanied by her husband today   Allergies:   Sulfa drugs: Unknown  Home Meds:  Home Medications: Medication Instructions Status  Vitamin D3 2000 intl units oral capsule 2 cap(s) orally once a day Active  Tums E-X 750 mg oral tablet, chewable 2 tab(s) orally once a day Active  Aspir 81 81 mg oral tablet 81 milligram(s) orally once a day, As Needed - for Headache Active  Synthroid 75 mcg (0.075 mg) oral tablet 1 tab(s) orally once a day Active   Review of Systems:  General negative   Performance Status (ECOG) 0   Skin negative   Breast see HPI   Ophthalmologic negative   ENMT negative   Respiratory and Thorax negative   Cardiovascular negative   Gastrointestinal negative   Genitourinary negative   Musculoskeletal negative   Neurological negative   Psychiatric negative   Hematology/Lymphatics negative   Endocrine negative   Allergic/Immunologic negative   Review of Systems   denies any weight loss, fatigue, weakness, fever, chills or night sweats. Patient denies any loss of vision, blurred vision. Patient  denies any ringing  of the ears or hearing loss. No irregular heartbeat. Patient denies heart murmur or history of fainting. Patient denies any chest pain or pain radiating to her upper extremities. Patient denies any shortness of breath, difficulty breathing at night, cough or hemoptysis. Patient denies any swelling  in the lower legs. Patient denies any nausea vomiting, vomiting of blood, or coffee ground material in the vomitus. Patient denies any stomach pain. Patient states has had normal bowel movements no significant constipation or diarrhea. Patient denies any dysuria, hematuria or significant nocturia. Patient denies any problems walking, swelling in the joints or loss of balance. Patient denies any skin changes, loss of hair or loss of weight. Patient denies any excessive worrying or anxiety or significant depression. Patient denies any problems with insomnia. Patient denies excessive thirst, polyuria, polydipsia. Patient denies any swollen glands, patient denies easy bruising or easy bleeding. Patient denies any recent infections, allergies or URI. Patient "s visual fields have not changed significantly in recent time.   Nursing Notes:  Nursing Vital Signs and Chemo Nursing Nursing Notes: *CC Vital Signs Flowsheet:   13-Apr-16 09:10  Temp Temperature 96.5  Pulse Pulse 83  Respirations Respirations 18  SBP SBP 144  DBP DBP 90  Current Weight (kg) (kg) 62.6   Physical Exam:  General/Skin/HEENT:  General normal   Skin normal   Eyes normal   ENMT normal   Head and Neck normal   Additional PE patient is status post lumpectomy of left breast as well as recent wide local excision and mastoplasty of the right breast. Incision is well-healed. No dominant mass or nodularity is noted in either breast in 2 positions examined. No axillary or supraclavicular adenopathy is appreciated. Lungs are clear to A&P cardiac examination shows regular rate and rhythm.abdomen is benign   Breasts/Resp/CV/GI/GU:  Respiratory and Thorax normal   Cardiovascular normal   Gastrointestinal normal   Genitourinary normal   MS/Neuro/Psych/Lymph:  Musculoskeletal normal   Neurological normal   Lymphatics normal   Relevent Results:   Relevant Scans and Labs mammograms have been requested for my review    Assessment and Plan: Impression:   stage IA (T1 N0 M0) invasive mammary carcinoma of the right breast in patient with prior history of left breast cancer status post wide local excision and adjuvant radiation therapy. Tumor is ER/PR positive. Plan:   at this time have recommended adjuvant radiation therapy to her right breast. Based on the fact she had mammoplasty would not be a candidate for MammoSite balloon catheter placement. She does have rather small breasts and would use a hypofractionated course of radiation over 4 weeks boosting her scar another 1400 cGy based on 4 mm margin. Risks and benefits of treatment including skin reaction and fatigue alteration of blood counts and inclusion of some superficial lung all were explained in detail to the patient and her husband. Both seem to comprehend my treatment plan well. I have set up and ordered CT simulation in about a week's time to allow further healing and patient and husband have a trip planned. Patient will also see medical oncology for consideration of tamoxifen therapy after completion of radiation.  I would like to take this opportunity for allowing me to participate in the care of your patient..  Fax to Physician:  Physicians To Recieve Fax: Tama High, MD - [phone].  Electronic Signatures: Montavius Subramaniam, Roda Shutters (MD)  (Signed 13-Apr-16 10:44)  Authored: HPI, Diagnosis, Past Hx, PFSH, Allergies, Home Meds, ROS, Nursing  Notes, Physical Exam, Relevent Results, Encounter Assessment and Plan, Fax to Physician   Last Updated: 13-Apr-16 10:44 by Armstead Peaks (MD)

## 2015-04-05 NOTE — Op Note (Signed)
PATIENT NAME:  Aimee Gutierrez, Aimee Gutierrez MR#:  [id number] DATE OF BIRTH:  May 14, 1933  DATE OF PROCEDURE:  03/09/2015  PREOPERATIVE DIAGNOSIS: Ductal carcinoma in situ of the right breast.   POSTOPERATIVE DIAGNOSIS: Ductal carcinoma in situ of the right breast.   OPERATIVE PROCEDURE:  Ultrasound-guided wide excision of the right breast, mastoplasty.   OPERATING SURGEON: Hervey Ard, MD   ANESTHESIA: General by LMA, Marcaine 0.5% with 1:200,000 units of epinephrine, 30 mL local infiltration.   ESTIMATED BLOOD LOSS: Minimal.   CLINICAL NOTE: This 81 year old woman was recently noted with an abnormal mammogram and core biopsy showed evidence of DCIS. She was considered a candidate for wide excision.   OPERATIVE NOTE: The patient was brought to the Operating Room and underwent general anesthesia. The breast was prepped with ChloraPrep and draped. Ultrasound was used to confirm the location of the previous biopsy cavity. A radial incision was made at the 4 o'clock position, extending from the edge of the areola to the periphery of the breast. The skin flaps were then elevated circumferentially. A 4 x 5 x 2 cm block of tissue was excised, orientated, and specimen radiograph confirmed the previously placed clip was present. The breast was then elevated off the underlying pectoralis muscle and this was completed approximately for a 6 cm distance. The breast was then mobilized to fill the tissue void and approximated with multiple layers of 2-0 Vicryl figure-of-eight sutures. The skin flaps were approximated with interrupted 3-0 Vicryl, and then the skin approximated with a running 4-0 Vicryl subcuticular suture. Benzoin and Steri-Strips followed by Telfa, fluff gauze, Kerlix and an Ace wrap were applied.   The patient tolerated the procedure well and was taken to the recovery room in stable condition.    ____________________________ Robert Bellow, MD jwb:AT D: 03/10/2015 21:21:00 ET T: 03/11/2015  08:22:38 ET JOB#: [id number]  cc: Adin Hector, MD Robert Bellow, MD, <Dictator>    Anthea Udovich Amedeo Kinsman MD ELECTRONICALLY SIGNED 03/11/2015 22:15

## 2015-04-06 ENCOUNTER — Ambulatory Visit: Payer: Medicare Other | Admitting: Radiation Oncology

## 2015-04-08 ENCOUNTER — Ambulatory Visit
Admission: RE | Admit: 2015-04-08 | Discharge: 2015-04-08 | Disposition: A | Discharge status: Home / Self Care | Payer: Medicare Other | Source: Ambulatory Visit | Attending: Radiation Oncology | Admitting: Radiation Oncology

## 2015-04-08 DIAGNOSIS — D0591 Unspecified type of carcinoma in situ of right breast: Secondary | ICD-10-CM | POA: Insufficient documentation

## 2015-04-08 DIAGNOSIS — Z170 Estrogen receptor positive status [ER+]: Secondary | ICD-10-CM | POA: Diagnosis not present

## 2015-04-08 DIAGNOSIS — Z510 Encounter for antineoplastic radiation therapy: Secondary | ICD-10-CM | POA: Insufficient documentation

## 2015-04-08 DIAGNOSIS — Z86000 Personal history of in-situ neoplasm of breast: Secondary | ICD-10-CM | POA: Diagnosis not present

## 2015-04-09 ENCOUNTER — Ambulatory Visit
Admission: RE | Admit: 2015-04-09 | Discharge: 2015-04-09 | Disposition: A | Discharge status: Home / Self Care | Payer: Medicare Other | Source: Ambulatory Visit | Attending: Radiation Oncology | Admitting: Radiation Oncology

## 2015-04-09 ENCOUNTER — Other Ambulatory Visit: Payer: Self-pay

## 2015-04-09 DIAGNOSIS — C50912 Malignant neoplasm of unspecified site of left female breast: Secondary | ICD-10-CM

## 2015-04-09 DIAGNOSIS — Z510 Encounter for antineoplastic radiation therapy: Secondary | ICD-10-CM | POA: Diagnosis not present

## 2015-04-10 ENCOUNTER — Ambulatory Visit
Admission: RE | Admit: 2015-04-10 | Discharge: 2015-04-10 | Disposition: A | Discharge status: Home / Self Care | Payer: Medicare Other | Source: Ambulatory Visit | Attending: Radiation Oncology | Admitting: Radiation Oncology

## 2015-04-10 DIAGNOSIS — Z510 Encounter for antineoplastic radiation therapy: Secondary | ICD-10-CM | POA: Diagnosis not present

## 2015-04-13 ENCOUNTER — Ambulatory Visit
Admission: RE | Admit: 2015-04-13 | Discharge: 2015-04-13 | Disposition: A | Discharge status: Home / Self Care | Payer: Medicare Other | Source: Ambulatory Visit | Attending: Radiation Oncology | Admitting: Radiation Oncology

## 2015-04-13 DIAGNOSIS — Z510 Encounter for antineoplastic radiation therapy: Secondary | ICD-10-CM | POA: Diagnosis not present

## 2015-04-14 ENCOUNTER — Ambulatory Visit
Admission: RE | Admit: 2015-04-14 | Discharge: 2015-04-14 | Disposition: A | Discharge status: Home / Self Care | Payer: Medicare Other | Source: Ambulatory Visit | Attending: Radiation Oncology | Admitting: Radiation Oncology

## 2015-04-14 DIAGNOSIS — Z510 Encounter for antineoplastic radiation therapy: Secondary | ICD-10-CM | POA: Diagnosis not present

## 2015-04-15 ENCOUNTER — Ambulatory Visit
Admission: RE | Admit: 2015-04-15 | Discharge: 2015-04-15 | Disposition: A | Discharge status: Home / Self Care | Payer: Medicare Other | Source: Ambulatory Visit | Attending: Radiation Oncology | Admitting: Radiation Oncology

## 2015-04-15 DIAGNOSIS — Z510 Encounter for antineoplastic radiation therapy: Secondary | ICD-10-CM | POA: Diagnosis not present

## 2015-04-16 ENCOUNTER — Ambulatory Visit
Admission: RE | Admit: 2015-04-16 | Discharge: 2015-04-16 | Disposition: A | Discharge status: Home / Self Care | Payer: Medicare Other | Source: Ambulatory Visit | Attending: Radiation Oncology | Admitting: Radiation Oncology

## 2015-04-16 ENCOUNTER — Inpatient Hospital Stay: Payer: Medicare Other | Attending: Oncology

## 2015-04-16 ENCOUNTER — Other Ambulatory Visit: Payer: Self-pay

## 2015-04-16 DIAGNOSIS — C50912 Malignant neoplasm of unspecified site of left female breast: Secondary | ICD-10-CM

## 2015-04-16 DIAGNOSIS — C50311 Malignant neoplasm of lower-inner quadrant of right female breast: Secondary | ICD-10-CM | POA: Diagnosis present

## 2015-04-16 DIAGNOSIS — Z86000 Personal history of in-situ neoplasm of breast: Secondary | ICD-10-CM | POA: Diagnosis not present

## 2015-04-16 DIAGNOSIS — Z923 Personal history of irradiation: Secondary | ICD-10-CM | POA: Insufficient documentation

## 2015-04-16 DIAGNOSIS — Z170 Estrogen receptor positive status [ER+]: Secondary | ICD-10-CM | POA: Insufficient documentation

## 2015-04-16 DIAGNOSIS — Z510 Encounter for antineoplastic radiation therapy: Secondary | ICD-10-CM | POA: Diagnosis not present

## 2015-04-16 LAB — CBC WITH DIFFERENTIAL/PLATELET
Basophils Absolute: 0.1 10*3/uL (ref 0–0.1)
Basophils Relative: 1 %
Eosinophils Absolute: 0.1 10*3/uL (ref 0–0.7)
Eosinophils Relative: 2 %
HCT: 41.1 % (ref 35.0–47.0)
Hemoglobin: 13.3 g/dL (ref 12.0–16.0)
Lymphocytes Relative: 15 %
Lymphs Abs: 0.7 10*3/uL — ABNORMAL LOW (ref 1.0–3.6)
MCH: 28.9 pg (ref 26.0–34.0)
MCHC: 32.4 g/dL (ref 32.0–36.0)
MCV: 89.2 fL (ref 80.0–100.0)
Monocytes Absolute: 0.3 10*3/uL (ref 0.2–0.9)
Monocytes Relative: 6 %
Neutro Abs: 3.4 10*3/uL (ref 1.4–6.5)
Neutrophils Relative %: 76 %
Platelets: 115 10*3/uL — ABNORMAL LOW (ref 150–440)
RBC: 4.6 MIL/uL (ref 3.80–5.20)
RDW: 15.4 % — ABNORMAL HIGH (ref 11.5–14.5)
WBC: 4.5 10*3/uL (ref 3.6–11.0)

## 2015-04-16 LAB — COMPREHENSIVE METABOLIC PANEL
ALT: 15 U/L (ref 14–54)
AST: 19 U/L (ref 15–41)
Albumin: 4.3 g/dL (ref 3.5–5.0)
Alkaline Phosphatase: 69 U/L (ref 38–126)
Anion gap: 4 — ABNORMAL LOW (ref 5–15)
BUN: 16 mg/dL (ref 6–20)
CO2: 30 mmol/L (ref 22–32)
Calcium: 9.2 mg/dL (ref 8.9–10.3)
Chloride: 102 mmol/L (ref 101–111)
Creatinine, Ser: 0.72 mg/dL (ref 0.44–1.00)
GFR calc Af Amer: >60 mL/min (ref >60)
GFR calc non Af Amer: >60 mL/min (ref >60)
Glucose, Bld: 106 mg/dL — ABNORMAL HIGH (ref 65–99)
Potassium: 3.9 mmol/L (ref 3.5–5.1)
Sodium: 136 mmol/L (ref 135–145)
Total Bilirubin: 0.6 mg/dL (ref 0.3–1.2)
Total Protein: 7.2 g/dL (ref 6.5–8.1)

## 2015-04-17 ENCOUNTER — Ambulatory Visit
Admission: RE | Admit: 2015-04-17 | Discharge: 2015-04-17 | Disposition: A | Discharge status: Home / Self Care | Payer: Medicare Other | Source: Ambulatory Visit | Attending: Radiation Oncology | Admitting: Radiation Oncology

## 2015-04-17 DIAGNOSIS — Z510 Encounter for antineoplastic radiation therapy: Secondary | ICD-10-CM | POA: Diagnosis not present

## 2015-04-17 LAB — CANCER ANTIGEN 27.29: CA 27.29: 24 U/mL (ref 0.0–38.6)

## 2015-04-20 ENCOUNTER — Ambulatory Visit
Admission: RE | Admit: 2015-04-20 | Discharge: 2015-04-20 | Disposition: A | Discharge status: Home / Self Care | Payer: Medicare Other | Source: Ambulatory Visit | Attending: Radiation Oncology | Admitting: Radiation Oncology

## 2015-04-20 DIAGNOSIS — Z510 Encounter for antineoplastic radiation therapy: Secondary | ICD-10-CM | POA: Diagnosis not present

## 2015-04-21 ENCOUNTER — Ambulatory Visit
Admission: RE | Admit: 2015-04-21 | Discharge: 2015-04-21 | Disposition: A | Discharge status: Home / Self Care | Payer: Medicare Other | Source: Ambulatory Visit | Attending: Radiation Oncology | Admitting: Radiation Oncology

## 2015-04-21 DIAGNOSIS — Z510 Encounter for antineoplastic radiation therapy: Secondary | ICD-10-CM | POA: Diagnosis not present

## 2015-04-22 ENCOUNTER — Ambulatory Visit
Admission: RE | Admit: 2015-04-22 | Discharge: 2015-04-22 | Disposition: A | Discharge status: Home / Self Care | Payer: Medicare Other | Source: Ambulatory Visit | Attending: Radiation Oncology | Admitting: Radiation Oncology

## 2015-04-22 DIAGNOSIS — Z510 Encounter for antineoplastic radiation therapy: Secondary | ICD-10-CM | POA: Diagnosis not present

## 2015-04-23 ENCOUNTER — Ambulatory Visit
Admission: RE | Admit: 2015-04-23 | Discharge: 2015-04-23 | Disposition: A | Discharge status: Home / Self Care | Payer: Medicare Other | Source: Ambulatory Visit | Attending: Radiation Oncology | Admitting: Radiation Oncology

## 2015-04-23 DIAGNOSIS — Z510 Encounter for antineoplastic radiation therapy: Secondary | ICD-10-CM | POA: Diagnosis not present

## 2015-04-24 ENCOUNTER — Ambulatory Visit
Admission: RE | Admit: 2015-04-24 | Discharge: 2015-04-24 | Disposition: A | Discharge status: Home / Self Care | Payer: Medicare Other | Source: Ambulatory Visit | Attending: Radiation Oncology | Admitting: Radiation Oncology

## 2015-04-24 DIAGNOSIS — Z510 Encounter for antineoplastic radiation therapy: Secondary | ICD-10-CM | POA: Diagnosis not present

## 2015-04-27 ENCOUNTER — Ambulatory Visit
Admission: RE | Admit: 2015-04-27 | Discharge: 2015-04-27 | Disposition: A | Discharge status: Home / Self Care | Payer: Medicare Other | Source: Ambulatory Visit | Attending: Radiation Oncology | Admitting: Radiation Oncology

## 2015-04-27 DIAGNOSIS — Z510 Encounter for antineoplastic radiation therapy: Secondary | ICD-10-CM | POA: Diagnosis not present

## 2015-04-28 ENCOUNTER — Ambulatory Visit
Admission: RE | Admit: 2015-04-28 | Discharge: 2015-04-28 | Disposition: A | Discharge status: Home / Self Care | Payer: Medicare Other | Source: Ambulatory Visit | Attending: Radiation Oncology | Admitting: Radiation Oncology

## 2015-04-28 DIAGNOSIS — Z510 Encounter for antineoplastic radiation therapy: Secondary | ICD-10-CM | POA: Diagnosis not present

## 2015-04-29 ENCOUNTER — Ambulatory Visit
Admission: RE | Admit: 2015-04-29 | Discharge: 2015-04-29 | Disposition: A | Discharge status: Home / Self Care | Payer: Medicare Other | Source: Ambulatory Visit | Attending: Radiation Oncology | Admitting: Radiation Oncology

## 2015-04-29 DIAGNOSIS — Z510 Encounter for antineoplastic radiation therapy: Secondary | ICD-10-CM | POA: Diagnosis not present

## 2015-04-30 ENCOUNTER — Encounter (INDEPENDENT_AMBULATORY_CARE_PROVIDER_SITE_OTHER): Payer: Self-pay

## 2015-04-30 ENCOUNTER — Ambulatory Visit
Admission: RE | Admit: 2015-04-30 | Discharge: 2015-04-30 | Disposition: A | Discharge status: Home / Self Care | Payer: Medicare Other | Source: Ambulatory Visit | Attending: Radiation Oncology | Admitting: Radiation Oncology

## 2015-04-30 ENCOUNTER — Inpatient Hospital Stay: Payer: Medicare Other

## 2015-04-30 DIAGNOSIS — C50311 Malignant neoplasm of lower-inner quadrant of right female breast: Secondary | ICD-10-CM | POA: Diagnosis not present

## 2015-04-30 DIAGNOSIS — D0591 Unspecified type of carcinoma in situ of right breast: Secondary | ICD-10-CM

## 2015-04-30 DIAGNOSIS — Z510 Encounter for antineoplastic radiation therapy: Secondary | ICD-10-CM | POA: Diagnosis not present

## 2015-04-30 LAB — CBC
HEMATOCRIT: 41.4 % (ref 35.0–47.0)
Hemoglobin: 13.5 g/dL (ref 12.0–16.0)
MCH: 29.1 pg (ref 26.0–34.0)
MCHC: 32.6 g/dL (ref 32.0–36.0)
MCV: 89.2 fL (ref 80.0–100.0)
PLATELETS: 125 10*3/uL — AB (ref 150–440)
RBC: 4.64 MIL/uL (ref 3.80–5.20)
RDW: 15.5 % — ABNORMAL HIGH (ref 11.5–14.5)
WBC: 4.4 10*3/uL (ref 3.6–11.0)

## 2015-05-01 DIAGNOSIS — Z510 Encounter for antineoplastic radiation therapy: Secondary | ICD-10-CM | POA: Diagnosis not present

## 2015-05-05 DIAGNOSIS — Z510 Encounter for antineoplastic radiation therapy: Secondary | ICD-10-CM | POA: Diagnosis not present

## 2015-05-06 DIAGNOSIS — Z510 Encounter for antineoplastic radiation therapy: Secondary | ICD-10-CM | POA: Diagnosis not present

## 2015-05-07 DIAGNOSIS — Z510 Encounter for antineoplastic radiation therapy: Secondary | ICD-10-CM | POA: Diagnosis not present

## 2015-05-08 ENCOUNTER — Inpatient Hospital Stay
Admission: RE | Admit: 2015-05-08 | Discharge: 2015-05-08 | Disposition: A | Discharge status: Home / Self Care | Payer: Self-pay | Source: Ambulatory Visit | Attending: Radiation Oncology | Admitting: Radiation Oncology

## 2015-05-08 DIAGNOSIS — Z510 Encounter for antineoplastic radiation therapy: Secondary | ICD-10-CM | POA: Diagnosis not present

## 2015-05-11 ENCOUNTER — Inpatient Hospital Stay
Admission: RE | Admit: 2015-05-11 | Discharge: 2015-05-11 | Disposition: A | Discharge status: Home / Self Care | Payer: Self-pay | Source: Ambulatory Visit | Attending: Radiation Oncology | Admitting: Radiation Oncology

## 2015-05-11 DIAGNOSIS — Z510 Encounter for antineoplastic radiation therapy: Secondary | ICD-10-CM | POA: Diagnosis not present

## 2015-05-12 ENCOUNTER — Encounter (INDEPENDENT_AMBULATORY_CARE_PROVIDER_SITE_OTHER): Payer: Self-pay

## 2015-05-12 ENCOUNTER — Ambulatory Visit
Admission: RE | Admit: 2015-05-12 | Discharge: 2015-05-12 | Disposition: A | Discharge status: Home / Self Care | Payer: Medicare Other | Source: Ambulatory Visit | Attending: Radiation Oncology | Admitting: Radiation Oncology

## 2015-05-12 ENCOUNTER — Other Ambulatory Visit: Payer: Self-pay | Admitting: *Deleted

## 2015-05-12 DIAGNOSIS — Z510 Encounter for antineoplastic radiation therapy: Secondary | ICD-10-CM | POA: Diagnosis not present

## 2015-05-12 MED ORDER — CIPROFLOXACIN HCL 500 MG PO TABS: 500.0000 mg | ORAL_TABLET | Freq: Every day | ORAL | Status: DC

## 2015-05-13 ENCOUNTER — Ambulatory Visit: Payer: Medicare Other

## 2015-05-13 ENCOUNTER — Ambulatory Visit
Admission: RE | Admit: 2015-05-13 | Discharge: 2015-05-13 | Disposition: A | Discharge status: Home / Self Care | Payer: Medicare Other | Source: Ambulatory Visit | Attending: Radiation Oncology | Admitting: Radiation Oncology

## 2015-05-13 DIAGNOSIS — Z510 Encounter for antineoplastic radiation therapy: Secondary | ICD-10-CM | POA: Diagnosis not present

## 2015-05-19 ENCOUNTER — Ambulatory Visit (INDEPENDENT_AMBULATORY_CARE_PROVIDER_SITE_OTHER): Payer: Medicare Other | Admitting: General Surgery

## 2015-05-19 ENCOUNTER — Encounter: Payer: Self-pay | Admitting: General Surgery

## 2015-05-19 VITALS — BP 124/64 | HR 80 | Resp 12 | Ht 66.0 in | Wt 141.0 lb

## 2015-05-19 DIAGNOSIS — C50911 Malignant neoplasm of unspecified site of right female breast: Secondary | ICD-10-CM

## 2015-05-19 DIAGNOSIS — M810 Age-related osteoporosis without current pathological fracture: Secondary | ICD-10-CM

## 2015-05-19 NOTE — Patient Instructions (Addendum)
Continue self breast exams. Call office for any new breast issues or concerns.  Appointment with Medical Oncology Dr. Mike Gip   Patient is scheduled for a Bone Density test at [facility] on 05/25/15 at 10:20 am. She will see Dr Mike Gip on 06/04/15 at 11:30 am at the [facility]. Patient is aware of dates, times, and instructions.

## 2015-05-19 NOTE — Progress Notes (Signed)
Patient ID: [name], female   DOB: Jul 25, 1933, 81 y.o.   MRN: [id number]  Chief Complaint  Patient presents with  . Routine Post Op    HPI Aimee Gutierrez is a 81 y.o. female.  Here today for postoperative visit, right breast wide excision on 03-09-15. She states she is using Aquaphor for her right nipple, her last radiation was treatment 05/13/15. She has another appointment to see Dr. Baruch Gouty in July.  At the time of her April 2016 exam she had been resistant to medications for osteoporosis, and for that reason strong consideration would be given to the tamoxifen therapy for antiestrogen treatment. At time of today's visit she is more interested in the possibility of a twice E are shot as discussed with Lorenda Cahill, M.D. from medical oncology.  She reports feeling well. Minimal breast discomfort. She had a root canal yesterday. Bone density was at least 3 years ago.  HPI  Past Medical History  Diagnosis Date  . Arthritis   . IBS (irritable bowel syndrome)   . Cancer 2013    with radiation, stage 1, left breast  . Breast cancer of lower-outer quadrant of right female breast 03/09/2015    3.5 mm invasive carcinoma, low-grade DCIS. ER 90%, PR greater than 50%, HER-2/neu not amplified.    Past Surgical History  Procedure Laterality Date  . Cataract extraction  2013, 2015  . Ovary surgery  2001    benign  . Breast surgery Left 2013    [facility]  . Breast surgery Right 03/09/15    wide excision    No family history on file.  Social History History  Substance Use Topics  . Smoking status: Former Smoker    Quit date: 12/05/2009  . Smokeless tobacco: Not on file  . Alcohol Use: No    Allergies  Allergen Reactions  . Sulfa Antibiotics Rash    Current Outpatient Prescriptions  Medication Sig Dispense Refill  . aspirin 81 MG tablet Take 81 mg by mouth as needed.     . calcium carbonate (TUMS - DOSED IN MG ELEMENTAL CALCIUM) 500 MG chewable tablet Chew 2 tablets  by mouth daily.     . Cholecalciferol (VITAMIN D3) 1000 UNITS CAPS Take 4,000 Units by mouth daily.     Marland Kitchen ibuprofen (ADVIL,MOTRIN) 200 MG tablet Take by mouth.    . Misc Natural Products (CYSTEX) LIQD Take by mouth.    . Probiotic Product (HEALTHY COLON PO) Take by mouth daily.    Marland Kitchen SYNTHROID 75 MCG tablet      No current facility-administered medications for this visit.    Review of Systems Review of Systems  Constitutional: Negative.   Respiratory: Negative.   Cardiovascular: Negative.     Blood pressure 124/64, pulse 80, resp. rate 12, height $RemoveBe'5\' 6"'bxBUoyiAe$  (1.676 m), weight 141 lb (63.957 kg).  Physical Exam Physical Exam  Constitutional: She is oriented to person, place, and time. She appears well-developed and well-nourished.  Neck: Neck supple.  Pulmonary/Chest:  Redness right nipple and areolar. Well healed incision 4 o'clock right breast  Lymphadenopathy:    She has no cervical adenopathy.  Neurological: She is alert and oriented to person, place, and time.  Skin: Skin is warm and dry.    Data Reviewed Pathology showed a 3.5 mm area of invasive cancer. The risk of axillary metastatic disease is small and the possibility that adjuvant chemotherapy was recommended is almost 0 for that reason axillary node biopsy has not been  recommended.  Assessment    Doing well status post wide excision and whole breast radiation.    Plan    Arrangements will be made for a bone density test as well as reassessment with medical oncology. The patient is scheduled to meet with her PCP later this month, and hopefully by that time a total treatment plan we'll been outlined.     Discussed in detail risk and benefits of aromatase inhibitors. Appointment with Medical Oncology Dr. Mike Gip  Appointment for bone density  Patient is scheduled for a Bone Density test at [facility] on 05/25/15 at 10:20 am. She will see Dr Mike Gip on 06/04/15 at 11:30 am at the [facility].  Patient is aware of dates, times, and instructions.  Follow up in October 2016.   PCP:  Aimee Gutierrez 05/20/2015, 8:47 PM

## 2015-05-21 ENCOUNTER — Other Ambulatory Visit: Payer: Self-pay | Admitting: Hematology and Oncology

## 2015-05-21 DIAGNOSIS — C50912 Malignant neoplasm of unspecified site of left female breast: Secondary | ICD-10-CM

## 2015-05-25 ENCOUNTER — Ambulatory Visit
Admission: RE | Admit: 2015-05-25 | Discharge: 2015-05-25 | Disposition: A | Discharge status: Home / Self Care | Payer: Medicare Other | Source: Ambulatory Visit | Attending: General Surgery | Admitting: General Surgery

## 2015-05-25 DIAGNOSIS — M810 Age-related osteoporosis without current pathological fracture: Secondary | ICD-10-CM | POA: Diagnosis present

## 2015-05-26 ENCOUNTER — Telehealth: Payer: Self-pay

## 2015-05-26 NOTE — Telephone Encounter (Signed)
-----   Message from Robert Bellow, MD sent at 05/26/2015 10:07 AM EDT ----- Please notify the patient that the bone density test confirms osteoporosis. She can discuss options for management when she follows up with Dr. Mike Gip.   Copy to PCP ----- Message -----    From: Rad Results In Interface    Sent: 05/25/2015   4:01 PM      To: Robert Bellow, MD

## 2015-05-26 NOTE — Telephone Encounter (Signed)
Notified patient as instructed, patient pleased. Discussed follow-up appointments, patient agrees. She will discuss these findings with Dr Mike Gip on 06/04/15.

## 2015-06-04 ENCOUNTER — Inpatient Hospital Stay: Payer: Medicare Other | Attending: Hematology and Oncology | Admitting: Hematology and Oncology

## 2015-06-04 ENCOUNTER — Encounter: Payer: Self-pay | Admitting: Hematology and Oncology

## 2015-06-04 VITALS — BP 116/77 | HR 76 | Temp 98.2°F | Resp 20 | Wt 138.9 lb

## 2015-06-04 DIAGNOSIS — M810 Age-related osteoporosis without current pathological fracture: Secondary | ICD-10-CM | POA: Insufficient documentation

## 2015-06-04 DIAGNOSIS — Z87440 Personal history of urinary (tract) infections: Secondary | ICD-10-CM

## 2015-06-04 DIAGNOSIS — Z87891 Personal history of nicotine dependence: Secondary | ICD-10-CM | POA: Insufficient documentation

## 2015-06-04 DIAGNOSIS — Z7982 Long term (current) use of aspirin: Secondary | ICD-10-CM | POA: Diagnosis not present

## 2015-06-04 DIAGNOSIS — Z853 Personal history of malignant neoplasm of breast: Secondary | ICD-10-CM

## 2015-06-04 DIAGNOSIS — Z923 Personal history of irradiation: Secondary | ICD-10-CM | POA: Diagnosis not present

## 2015-06-04 DIAGNOSIS — C50511 Malignant neoplasm of lower-outer quadrant of right female breast: Secondary | ICD-10-CM | POA: Diagnosis not present

## 2015-06-04 DIAGNOSIS — K088 Other specified disorders of teeth and supporting structures: Secondary | ICD-10-CM | POA: Diagnosis not present

## 2015-06-04 DIAGNOSIS — M1990 Unspecified osteoarthritis, unspecified site: Secondary | ICD-10-CM | POA: Insufficient documentation

## 2015-06-04 DIAGNOSIS — R5383 Other fatigue: Secondary | ICD-10-CM | POA: Diagnosis not present

## 2015-06-04 DIAGNOSIS — K589 Irritable bowel syndrome without diarrhea: Secondary | ICD-10-CM | POA: Diagnosis not present

## 2015-06-04 DIAGNOSIS — Z79899 Other long term (current) drug therapy: Secondary | ICD-10-CM | POA: Insufficient documentation

## 2015-06-04 DIAGNOSIS — C50911 Malignant neoplasm of unspecified site of right female breast: Secondary | ICD-10-CM

## 2015-06-04 NOTE — Progress Notes (Signed)
[facility] day:  06/04/2015  Chief Complaint: Aimee Gutierrez is a 81 y.o. female with a history of stage I left breast cancer who is seen for assessment after completion of radiation.  HPI: The patient was last seen in the medical oncology clinic on 03/30/2015.  At that time, she was seen for reassessment after wide excision of the right breast.  Symptomatically, she was doing well.  She had healed from surgery.  She had been simulated for radiation.  During the interim, she completed radiation on 05/13/2015.  She states that "it knocked me for a loop" and that she is "still trying to recover".  She describes a tooth problem for which she got a root canal on 05/18/2015.  She needs to follow-up with her dentist next week for a permanent filling.  She got a UTI x 2 and a "double dose of ciprofloxacin".  Her UTI was not sensitive to cipro, and thus she received nitrofurantoin.  This caused nausea, vomiting, and feeling awful.  She was in bed all day Sunday.  She saw Dr. Caryl Comes yesterday and was told not to continue antibiotics.  She read the information on Prolia and doesn't like the side effects.  She is considering 'the pill" (Fosamax).  She has "no intention of initiating any medicine" (hormone therapy).  Past Medical History  Diagnosis Date  . Arthritis   . IBS (irritable bowel syndrome)   . Cancer 2013    with radiation, stage 1, left breast  . Breast cancer of lower-outer quadrant of right female breast 03/09/2015    3.5 mm invasive carcinoma, low-grade DCIS. ER 90%, PR greater than 50%, HER-2/neu not amplified.    Past Surgical History  Procedure Laterality Date  . Cataract extraction  2013, 2015  . Ovary surgery  2001    benign  . Breast surgery Left 2013    [facility]  . Breast surgery Right 03/09/15    wide excision    Family History  Problem Relation Age of Onset  . Arthritis Mother   . Heart disease Father   . Alzheimer's  disease Sister     Social History:  reports that she quit smoking about 5 years ago. She does not have any smokeless tobacco history on file. She reports that she does not drink alcohol or use illicit drugs.  The patient is accompanied by her husband today.  Allergies:  Allergies  Allergen Reactions  . Nitrofurantoin Nausea Only  . Sulfa Antibiotics Rash    Current Medications: Current Outpatient Prescriptions  Medication Sig Dispense Refill  . aspirin 81 MG tablet Take 81 mg by mouth as needed.     . calcium carbonate (TUMS - DOSED IN MG ELEMENTAL CALCIUM) 500 MG chewable tablet Chew 2 tablets by mouth daily.     . Cholecalciferol (VITAMIN D3) 1000 UNITS CAPS Take 4,000 Units by mouth daily.     Marland Kitchen ibuprofen (ADVIL,MOTRIN) 200 MG tablet Take by mouth.    . Misc Natural Products (CYSTEX) LIQD Take by mouth.    . SYNTHROID 75 MCG tablet     . Probiotic Product (HEALTHY COLON PO) Take by mouth daily.     No current facility-administered medications for this visit.    Review of Systems:  GENERAL:  Fatigue.  "In the morning I am great".  Rests after lunch.  "Half a day is my day". No fevers, sweats or weight loss. PERFORMANCE STATUS (ECOG):  1 HEENT:  No visual changes, runny nose, sore throat, mouth sores or tenderness. Lungs: No shortness of breath or cough.  No hemoptysis. Cardiac:  No chest pain, palpitations, orthopnea, or PND. GI:  No nausea, vomiting, diarrhea, constipation, melena or hematochezia. GU:  Recent UTI x 2.  No urgency, frequency, dysuria, or hematuria. Musculoskeletal:  Needs a cane to walk.  No back pain.  No joint pain.  No muscle tenderness. Extremities:  No pain or swelling. Skin:  No rashes or skin changes. Neuro:  No headache, numbness or weakness, balance or coordination issues. Endocrine:  No diabetes, thyroid issues, hot flashes or night sweats. Psych:  No mood changes, depression or anxiety. Pain:  No focal pain. Review of systems:  All other systems  reviewed and found to be negative.   Physical Exam: Blood pressure 116/77, pulse 76, temperature 98.2 F (36.8 C), temperature source Tympanic, resp. rate 20, weight 138 lb 14.2 oz (63 kg), SpO2 97 %. GENERAL:  Well developed, well nourished, sitting comfortably in the exam room in no acute distress. MENTAL STATUS:  Alert and oriented to person, place and time. HEAD:  Pearline Cables hair.  Normocephalic, atraumatic, face symmetric, no Cushingoid features. EYES:  Hazel eyes.  Pupils equal round and reactive to light and accomodation.  No conjunctivitis or scleral icterus. ENT:  Oropharynx clear without lesion.  Tongue normal. Mucous membranes moist.  RESPIRATORY:  Clear to auscultation without rales, wheezes or rhonchi. CARDIOVASCULAR:  Regular rate and rhythm without murmur, rub or gallop. ABDOMEN:  Soft, non-tender, with active bowel sounds, and no hepatosplenomegaly.  No masses. SKIN:  No rashes, ulcers or lesions. EXTREMITIES: Arthritic changes.  No edema, no skin discoloration or tenderness.  No palpable cords. LYMPH NODES: No palpable cervical, supraclavicular, axillary or inguinal adenopathy  NEUROLOGICAL: Unremarkable. PSYCH:  Appropriate.   No visits with results within 3 Day(s) from this visit. Latest known visit with results is:  Appointment on 04/30/2015  Component Date Value Ref Range Status  . WBC 04/30/2015 4.4  3.6 - 11.0 K/uL Final  . RBC 04/30/2015 4.64  3.80 - 5.20 MIL/uL Final  . Hemoglobin 04/30/2015 13.5  12.0 - 16.0 g/dL Final  . HCT 04/30/2015 41.4  35.0 - 47.0 % Final  . MCV 04/30/2015 89.2  80.0 - 100.0 fL Final  . MCH 04/30/2015 29.1  26.0 - 34.0 pg Final  . MCHC 04/30/2015 32.6  32.0 - 36.0 g/dL Final  . RDW 04/30/2015 15.5* 11.5 - 14.5 % Final  . Platelets 04/30/2015 125* 150 - 440 K/uL Final    Assessment:  Aimee Gutierrez is a 81 y.o. female with a history of bilateral breast cancer.  She initially presented with stage I left breast cancer s/p lumpectomy and  sentinel lymph node biopsy on 03/09/2012.  Initial biopsy revealed a 7 mm lesion in the left breast.  Pathology revealed a 1 mm focus of residual invasive mammry carcinoma with DCIS.  Stage T1aN0M0.  Tumor was ER/PR+.  Her2/neu was not assessed.  She received radiation from 04/26/2012 to 06/20/2012.  She declined hormonal therapy.    Mammogram on 02/13/2014 revealed no evidence of maligancy.  Mammogram and ultrasound on 02/16/2015 revealed a 5 x 3 x 2 mm hypoechoic mass at the 4 o'clock position of the right breast.  Core biopsy on 02/17/2015 revealed low grade DCIS, cribiform type.  Estrogen receptor was positive (> 90%) and progesterone receptor positive (> 90%).    She underwent wide excision of the right breast on 03/09/2015.  Pathology revealed a 3.5 mm focus of grade II invasive carcinoma with ductal carcinoma in situ.  Margins were negative.  No lymph nodes were removed.  Tumor was ER positive (> 90%), PR positive (51-90%) and Her2/neu negative.  Pathologic stage was T1aNxMx.  She completed radiation on 05/13/2015.  Bone density study on 05/25/2015 revealed osteoporosis (T score of -3.3 in the femur neck and -3.5 in the forearm).  She is not interested in Prolia, but is considering Fosamax.  Symptomatically, she has had problems with a UTI.  She has had recent dental work.  Plan: 1. Obtain copy of bone density study- done. 2. Patient declines Prolia, considering Fosamax. 3. Follow-up with [facility] regarding clearance for Fosamax. 4. Discuss hormonal therapy- declines. 5. Discuss calcium and vitamin D. 6. Nutrition consult per patient request. 7. RTC in 1 month for MD assess, labs (CBC with diff, CMP, CA27.29).   Lequita Asal, MD  06/04/2015, 11:44 AM

## 2015-06-17 ENCOUNTER — Ambulatory Visit: Payer: Medicare Other | Admitting: Radiation Oncology

## 2015-06-18 ENCOUNTER — Ambulatory Visit: Payer: Medicare Other | Admitting: Radiation Oncology

## 2015-07-02 ENCOUNTER — Inpatient Hospital Stay: Payer: Medicare Other | Attending: Hematology and Oncology

## 2015-07-02 ENCOUNTER — Other Ambulatory Visit: Payer: Self-pay | Admitting: *Deleted

## 2015-07-02 ENCOUNTER — Ambulatory Visit
Admission: RE | Admit: 2015-07-02 | Discharge: 2015-07-02 | Disposition: A | Discharge status: Home / Self Care | Payer: Medicare Other | Source: Ambulatory Visit | Attending: Radiation Oncology | Admitting: Radiation Oncology

## 2015-07-02 ENCOUNTER — Other Ambulatory Visit: Payer: Self-pay | Admitting: Radiation Oncology

## 2015-07-02 ENCOUNTER — Inpatient Hospital Stay: Payer: Medicare Other

## 2015-07-02 ENCOUNTER — Inpatient Hospital Stay (HOSPITAL_BASED_OUTPATIENT_CLINIC_OR_DEPARTMENT_OTHER): Payer: Medicare Other | Admitting: Hematology and Oncology

## 2015-07-02 VITALS — BP 147/87 | HR 72 | Temp 96.2°F | Resp 20 | Wt 138.4 lb

## 2015-07-02 DIAGNOSIS — Z853 Personal history of malignant neoplasm of breast: Secondary | ICD-10-CM

## 2015-07-02 DIAGNOSIS — R399 Unspecified symptoms and signs involving the genitourinary system: Secondary | ICD-10-CM | POA: Insufficient documentation

## 2015-07-02 DIAGNOSIS — Z170 Estrogen receptor positive status [ER+]: Secondary | ICD-10-CM | POA: Diagnosis not present

## 2015-07-02 DIAGNOSIS — M810 Age-related osteoporosis without current pathological fracture: Secondary | ICD-10-CM | POA: Insufficient documentation

## 2015-07-02 DIAGNOSIS — Z79899 Other long term (current) drug therapy: Secondary | ICD-10-CM | POA: Insufficient documentation

## 2015-07-02 DIAGNOSIS — Z7982 Long term (current) use of aspirin: Secondary | ICD-10-CM | POA: Diagnosis not present

## 2015-07-02 DIAGNOSIS — K30 Functional dyspepsia: Secondary | ICD-10-CM | POA: Insufficient documentation

## 2015-07-02 DIAGNOSIS — R1013 Epigastric pain: Secondary | ICD-10-CM | POA: Insufficient documentation

## 2015-07-02 DIAGNOSIS — C50911 Malignant neoplasm of unspecified site of right female breast: Secondary | ICD-10-CM

## 2015-07-02 DIAGNOSIS — M1990 Unspecified osteoarthritis, unspecified site: Secondary | ICD-10-CM | POA: Insufficient documentation

## 2015-07-02 DIAGNOSIS — R300 Dysuria: Secondary | ICD-10-CM

## 2015-07-02 DIAGNOSIS — C50311 Malignant neoplasm of lower-inner quadrant of right female breast: Secondary | ICD-10-CM | POA: Diagnosis not present

## 2015-07-02 DIAGNOSIS — Z87440 Personal history of urinary (tract) infections: Secondary | ICD-10-CM | POA: Diagnosis not present

## 2015-07-02 DIAGNOSIS — E039 Hypothyroidism, unspecified: Secondary | ICD-10-CM | POA: Insufficient documentation

## 2015-07-02 DIAGNOSIS — IMO0002 Reserved for concepts with insufficient information to code with codable children: Secondary | ICD-10-CM | POA: Insufficient documentation

## 2015-07-02 DIAGNOSIS — D0591 Unspecified type of carcinoma in situ of right breast: Secondary | ICD-10-CM

## 2015-07-02 LAB — COMPREHENSIVE METABOLIC PANEL
ALT: 15 U/L (ref 14–54)
AST: 18 U/L (ref 15–41)
Albumin: 4.5 g/dL (ref 3.5–5.0)
Alkaline Phosphatase: 65 U/L (ref 38–126)
Anion gap: 6 (ref 5–15)
BUN: 10 mg/dL (ref 6–20)
CO2: 29 mmol/L (ref 22–32)
Calcium: 9.2 mg/dL (ref 8.9–10.3)
Chloride: 101 mmol/L (ref 101–111)
Creatinine, Ser: 0.85 mg/dL (ref 0.44–1.00)
GFR calc Af Amer: >60 mL/min (ref >60)
GFR calc non Af Amer: >60 mL/min (ref >60)
Glucose, Bld: 98 mg/dL (ref 65–99)
Potassium: 4.2 mmol/L (ref 3.5–5.1)
Sodium: 136 mmol/L (ref 135–145)
Total Bilirubin: 0.8 mg/dL (ref 0.3–1.2)
Total Protein: 7.4 g/dL (ref 6.5–8.1)

## 2015-07-02 LAB — URINALYSIS COMPLETE WITH MICROSCOPIC (ARMC ONLY)
BACTERIA UA: NONE SEEN
Bilirubin Urine: NEGATIVE
Glucose, UA: NEGATIVE mg/dL
Hgb urine dipstick: NEGATIVE
KETONES UR: NEGATIVE mg/dL
Leukocytes, UA: NEGATIVE
NITRITE: POSITIVE — AB
Protein, ur: NEGATIVE mg/dL
RBC / HPF: NONE SEEN RBC/hpf (ref 0–5)
Specific Gravity, Urine: 1.013 (ref 1.005–1.030)
pH: 7 (ref 5.0–8.0)

## 2015-07-02 LAB — CBC
HEMATOCRIT: 39.7 % (ref 35.0–47.0)
HEMOGLOBIN: 12.8 g/dL (ref 12.0–16.0)
MCH: 29.2 pg (ref 26.0–34.0)
MCHC: 32.3 g/dL (ref 32.0–36.0)
MCV: 90.3 fL (ref 80.0–100.0)
PLATELETS: 113 10*3/uL — AB (ref 150–440)
RBC: 4.39 MIL/uL (ref 3.80–5.20)
RDW: 15.8 % — AB (ref 11.5–14.5)
WBC: 3.7 10*3/uL (ref 3.6–11.0)

## 2015-07-02 MED ORDER — TAMOXIFEN CITRATE 20 MG PO TABS: 20.0000 mg | ORAL_TABLET | Freq: Every day | ORAL | Status: DC

## 2015-07-02 NOTE — Progress Notes (Addendum)
Patient presents in routine followup 6 weeks post completion of adjuvant radiation to the right breast for early stage right breast cancer, T1a, cN0M0, Stage I. She has a history of a very early left breast cancer in 2013 status post local excision and left breast radiation as well. Both lesions have been ER and PR positive and both were mainly DCIS, found on mammography. She has never taken hormonal therapy but is considering tamoxifen at this time. She sees Dr. Mike Gip re this later today. She denies current problems with the breast but states her recent radiation fatigued her much more than the previous one. She noted occasional dry 'drainage' around the nipple on the right side a few weeks ago. No blood or liquid. Other recent problems include a root canal for dental issues in June and 2 urinary tract infections. She has had several courses of antibiotics but feels she is having the UTI come back again this week with severe dysuria at times. She notes no hematuria. She has been taking pyridium prn for this.   Physical Exam: Well developed, well nourished woman in no acute distress. No adenopathy neck, supraclavicular, axillary regions. No upper extremity lymphedema and full ROM bilateral arms. Lungs - clear bilaterally. Heart - regular rate and rhythm. Breasts - soft, glandular, no dominant mass or suspicious changes. Lumpectomy incisions well healed. Musculoskeletal - changes consistent with arthritis hands, knees. No lower extremity edema.   Impression: 1. Early stage ER/PR positive right breast cancer in patient with history of early stage ER/PR positive left breast cancer, both treated with breast conservation approach and clinically doing well. Considering Tamoxifen for hormonal therapy through Dr. Mike Gip. 2. Ongoing complaint of dysuria post recent treatments x 2 through her primary physician.  3. Osteoporosis by bone density recently.   Plan: I will obtain urine for urinalysis, culture and  sensitivity today. Patient is instructed to followup with her primary physician, Dr. Caryl Comes, regarding results and any further management of her dysuria. She will followup here regarding her breast cancer in 6 months or sooner should any need arise.

## 2015-07-02 NOTE — Progress Notes (Addendum)
[facility] day:  07/02/2015  Chief Complaint: Aimee Gutierrez is a 81 y.o. female with a history of bilateral breast cancer who is seen for 1 month reassessment.  HPI: The patient was last seen in the medical oncology clinic on 06/04/2015.  At that time, she was seen for assessment after completion of radiation.  She had struggled with 2 UTIs and side effects associated with nitrofurantoin.  We discussed her osteoporosis and the use of Prolia or an oral bisphosphonate (Fosamax).  She did not want Prolia, but wanted to talk to a nutritionist.  We discussed calcium and vitamin D.  She was not interested in hormonal therapy.  I spoke with her endodontist, Dr. Geraldo Docker.  He felt Fosmax was "ok", but requested I talk to her dentist, Dr Joanna Hews, to ensure no further dental work was needed.  During the interim, she has decided against  Fosamax.  She doesn't like the side effects.  She wants to try tamoxifen.  She has read the information.  She saw radiation oncology today (Dr Alroy Dust covering for Dr. Baruch Gouty).  She is doing well.  A urinalysis and culture were collected for recurrent UTI symptoms.  Past Medical History  Diagnosis Date  . Arthritis   . IBS (irritable bowel syndrome)   . Cancer 2013    with radiation, stage 1, left breast  . Breast cancer of lower-outer quadrant of right female breast 03/09/2015    3.5 mm invasive carcinoma, low-grade DCIS. ER 90%, PR greater than 50%, HER-2/neu not amplified.    Past Surgical History  Procedure Laterality Date  . Cataract extraction  2013, 2015  . Ovary surgery  2001    benign  . Breast surgery Left 2013    [facility]  . Breast surgery Right 03/09/15    wide excision    Family History  Problem Relation Age of Onset  . Arthritis Mother   . Heart disease Father   . Alzheimer's disease Sister     Social History:  reports that she quit smoking about 5 years ago. She does not  have any smokeless tobacco history on file. She reports that she does not drink alcohol or use illicit drugs.  The patient is accompanied by [name] (who is in a wheelchair) today.  Allergies:  Allergies  Allergen Reactions  . Nitrofurantoin Nausea Only  . Sulfa Antibiotics Rash    Current Medications: Current Outpatient Prescriptions  Medication Sig Dispense Refill  . aspirin 81 MG tablet Take 81 mg by mouth as needed.     . calcium carbonate (TUMS - DOSED IN MG ELEMENTAL CALCIUM) 500 MG chewable tablet Chew 2 tablets by mouth daily.     . Cholecalciferol (VITAMIN D3) 1000 UNITS CAPS Take 4,000 Units by mouth daily.     Marland Kitchen ibuprofen (ADVIL,MOTRIN) 200 MG tablet Take by mouth.    . Misc Natural Products (CYSTEX) LIQD Take by mouth.    . phenazopyridine (PYRIDIUM) 100 MG tablet Take 100 mg by mouth 3 (three) times daily as needed for pain.    . Probiotic Product (HEALTHY COLON PO) Take by mouth daily.    Marland Kitchen SYNTHROID 75 MCG tablet     . tamoxifen (NOLVADEX) 20 MG tablet Take 1 tablet (20 mg total) by mouth daily. (Patient not taking: Reported on 07/06/2015) 30 tablet 3   No current facility-administered medications for this visit.    Review of Systems:  GENERAL:  Feels "  ok". No fevers, sweats or weight loss. PERFORMANCE STATUS (ECOG):  1 HEENT:  No visual changes, runny nose, sore throat, mouth sores or tenderness. Lungs: No shortness of breath or cough.  No hemoptysis. Cardiac:  No chest pain, palpitations, orthopnea, or PND. GI:  No nausea, vomiting, diarrhea, constipation, melena or hematochezia. GU: Possible recurrent UTI.Marland Kitchen Musculoskeletal:  Needs a cane to walk.  No back pain.  No joint pain.  No muscle tenderness. Extremities:  No pain or swelling. Skin:  No rashes or skin changes. Neuro:  No headache, numbness or weakness, balance or coordination issues. Endocrine:  No diabetes, thyroid issues, hot flashes or night sweats. Psych:  No mood changes, depression or  anxiety. Pain:  No focal pain. Review of systems:  All other systems reviewed and found to be negative.   Physical Exam: There were no vitals taken for this visit. GENERAL:  Well developed, well nourished, sitting comfortably in the exam room in no acute distress. MENTAL STATUS:  Alert and oriented to person, place and time. HEAD:  Pearline Cables hair.  Normocephalic, atraumatic, face symmetric, no Cushingoid features. EYES:  Hazel eyes.  Pupils equal round and reactive to light and accomodation.  No conjunctivitis or scleral icterus. ENT:  Oropharynx clear without lesion.  Tongue normal. Mucous membranes moist.  RESPIRATORY:  Clear to auscultation without rales, wheezes or rhonchi. CARDIOVASCULAR:  Regular rate and rhythm without murmur, rub or gallop. ABDOMEN:  Soft, non-tender, with active bowel sounds, and no hepatosplenomegaly.  No masses. SKIN:  No rashes, ulcers or lesions. EXTREMITIES: No edema, no skin discoloration or tenderness.  No palpable cords. LYMPH NODES: No palpable cervical, supraclavicular, axillary or inguinal adenopathy  NEUROLOGICAL: Unremarkable. PSYCH:  Appropriate.  Office Visit on 07/02/2015  Component Date Value Ref Range Status  . Color, Urine 07/02/2015 AMBER* YELLOW Final  . APPearance 07/02/2015 CLEAR* CLEAR Final  . Glucose, UA 07/02/2015 NEGATIVE  NEGATIVE mg/dL Final  . Bilirubin Urine 07/02/2015 NEGATIVE  NEGATIVE Final  . Ketones, ur 07/02/2015 NEGATIVE  NEGATIVE mg/dL Final  . Specific Gravity, Urine 07/02/2015 1.013  1.005 - 1.030 Final  . Hgb urine dipstick 07/02/2015 NEGATIVE  NEGATIVE Final  . pH 07/02/2015 7.0  5.0 - 8.0 Final  . Protein, ur 07/02/2015 NEGATIVE  NEGATIVE mg/dL Final  . Nitrite 07/02/2015 POSITIVE* NEGATIVE Final  . Leukocytes, UA 07/02/2015 NEGATIVE  NEGATIVE Final  . RBC / HPF 07/02/2015 NONE SEEN  0 - 5 RBC/hpf Final  . WBC, UA 07/02/2015 0-5  0 - 5 WBC/hpf Final  . Bacteria, UA 07/02/2015 NONE SEEN  NONE SEEN Final  . Squamous  Epithelial / LPF 07/02/2015 0-5* NONE SEEN Final  . Mucous 07/02/2015 PRESENT   Final  Appointment on 07/02/2015  Component Date Value Ref Range Status  . WBC 07/02/2015 3.7  3.6 - 11.0 K/uL Final  . RBC 07/02/2015 4.39  3.80 - 5.20 MIL/uL Final  . Hemoglobin 07/02/2015 12.8  12.0 - 16.0 g/dL Final  . HCT 07/02/2015 39.7  35.0 - 47.0 % Final  . MCV 07/02/2015 90.3  80.0 - 100.0 fL Final  . MCH 07/02/2015 29.2  26.0 - 34.0 pg Final  . MCHC 07/02/2015 32.3  32.0 - 36.0 g/dL Final  . RDW 07/02/2015 15.8* 11.5 - 14.5 % Final  . Platelets 07/02/2015 113* 150 - 440 K/uL Final  . Sodium 07/02/2015 136  135 - 145 mmol/L Final  . Potassium 07/02/2015 4.2  3.5 - 5.1 mmol/L Final  . Chloride 07/02/2015 101  101 - 111 mmol/L Final  . CO2 07/02/2015 29  22 - 32 mmol/L Final  . Glucose, Bld 07/02/2015 98  65 - 99 mg/dL Final  . BUN 07/02/2015 10  6 - 20 mg/dL Final  . Creatinine, Ser 07/02/2015 0.85  0.44 - 1.00 mg/dL Final  . Calcium 07/02/2015 9.2  8.9 - 10.3 mg/dL Final  . Total Protein 07/02/2015 7.4  6.5 - 8.1 g/dL Final  . Albumin 07/02/2015 4.5  3.5 - 5.0 g/dL Final  . AST 07/02/2015 18  15 - 41 U/L Final  . ALT 07/02/2015 15  14 - 54 U/L Final  . Alkaline Phosphatase 07/02/2015 65  38 - 126 U/L Final  . Total Bilirubin 07/02/2015 0.8  0.3 - 1.2 mg/dL Final  . GFR calc non Af Amer 07/02/2015 >60  >60 mL/min Final  . GFR calc Af Amer 07/02/2015 >60  >60 mL/min Final   Comment: (NOTE) The eGFR has been calculated using the CKD EPI equation. This calculation has not been validated in all clinical situations. eGFR's persistently <60 mL/min signify possible Chronic Kidney Disease.   Georgiann Hahn gap 07/02/2015 6  5 - 15 Final    Assessment:  BRIANDA BEITLER is a 81 y.o. female with a history of bilateral breast cancer.  She initially presented with stage I left breast cancer s/p lumpectomy and sentinel lymph node biopsy on 03/09/2012.  Initial biopsy revealed a 7 mm lesion in the left breast.   Pathology revealed a 1 mm focus of residual invasive mammry carcinoma with DCIS.  Stage T1aN0M0.  Tumor was ER/PR+.  Her2/neu was not assessed.  She received radiation from 04/26/2012 to 06/20/2012.  She declined hormonal therapy.    Mammogram on 02/13/2014 revealed no evidence of maligancy.  Mammogram and ultrasound on 02/16/2015 revealed a 5 x 3 x 2 mm hypoechoic mass at the 4 o'clock position of the right breast.  Core biopsy on 02/17/2015 revealed low grade DCIS, cribiform type.  Estrogen receptor was positive (> 90%) and progesterone receptor positive (> 90%).    She underwent wide excision of the right breast on 03/09/2015.  Pathology revealed a 3.5 mm focus of grade II invasive carcinoma with ductal carcinoma in situ.  Margins were negative.  No lymph nodes were removed.  Tumor was ER positive (> 90%), PR positive (51-90%) and Her2/neu negative.  Pathologic stage was T1aNxMx.  She completed radiation on 05/13/2015.  Bone density study on 05/25/2015 revealed osteoporosis (T score of -3.3 in the femur neck and -3.5 in the forearm).  She is not interested in Prolia or Fosamax.  Symptomatically, she has recurrent UTI symptoms.   Urine culture was obtained today in radiation therapy.  She is considering tamoxifen.    Plan: 1. Discuss hormonal therapy.  Patient considering tamoxifen. 2. Discuss conversation with dentist and endodontist.  Patient declines Fosamax and Prolia. 3. Labs today pre tamoxifen:  CBC with diff, CMP. 4. Rx:  Tamoxifen 20 mg a day; dis: #30 with 3 refills. 5. RTC in 1 month for MD assess and labs (LFTs)   Lequita Asal, MD  07/02/2015

## 2015-07-06 ENCOUNTER — Encounter: Payer: Medicare Other | Attending: Hematology and Oncology | Admitting: Dietician

## 2015-07-06 VITALS — Ht 65.5 in | Wt 138.7 lb

## 2015-07-06 DIAGNOSIS — M810 Age-related osteoporosis without current pathological fracture: Secondary | ICD-10-CM

## 2015-07-06 NOTE — Progress Notes (Signed)
Medical Nutrition Therapy: Visit start time: 1030  end time: 1100  Assessment:  Diagnosis: osteoporosis Past medical history: breast cancer twice, diverticulitis, arthritis per patient Psychosocial issues/ stress concerns: none Preferred learning method:  Nicki [place] . Hands-on  Current weight: 138.7lbs  Height: 5'5.5" Medications, supplements: reviewed list in chart with patient Progress and evaluation: patient reports struggling to increase bone density. She has information on acid -- alkaline diet which she is interested in trying.           She reports some current bladder issues and pain, so unable to stay for more than 30 minutes. She is seeing MD today. Physical activity: unknown  Dietary Intake:  Usual eating pattern includes 3 meals and 2 snacks per day. Dining out frequency: 0 meals per week.  Breakfast: corn flakes with fruit, 2% milk, small amount of sugar, coffee, orange juice. Snack: 1/2 apple with peanut butter Lunch: 1/2 sandwich with honey wheat bread and tomato, egg salad, pimento cheese, or ham and cheese. Also has fruit or vegetable. Drinks water or Ensure (1/2 bottle) Snack: usually none Supper: chicken often, mac and cheese, carrots, green beans, cauliflower, broccoli, slaw, baked apples, peaches, strawberries. 8/1 white beans, chowchow, corn bread.  Snack: sometimes ice cream or pound cake for dessert after supper, occasionally peppermint patty. Beverages: water, coffee in am  Nutrition Care Education: Topics covered: osteoporosis  calcium and vitamin D needs and sources; pros and cons of acid-reducing diet.  Nutritional Diagnosis:  NI-5.3 Inadequate protein-energy intake As related to small food portions.  As evidenced by patient report.  Intervention: Discusssion as noted above.   Will contact patient at a later date to review discussion and present individualized guidelines for preventing further bone loss.  Education Materials given:  Marland Kitchen Osteoporosis, Bone  Health [phone])  Learner/ who was taught:  . Patient   Level of understanding: . Partial understanding; needs review/ practice  Demonstrated degree of understanding via:   Teach back Learning barriers: . None  Willingness to learn/ readiness for change: . Eager, change in progress  Monitoring and Evaluation:  Dietary intake, exercise, osteoporosis diet, and body weight      follow up: prn

## 2015-07-28 ENCOUNTER — Telehealth: Payer: Self-pay | Admitting: Dietician

## 2015-07-28 NOTE — Telephone Encounter (Signed)
Patient returned call, stated she is doing well.  Advised her to use caution with alkaline diet, as it is not standard practice.  Encouraged her to include adequate protein sources, include calcium-fortified foods and beverages, and not to avoid meats.  She states she will access web MD regarding alkalline diet and follow nutrition advice.

## 2015-07-31 ENCOUNTER — Other Ambulatory Visit: Payer: Self-pay

## 2015-07-31 DIAGNOSIS — C50912 Malignant neoplasm of unspecified site of left female breast: Secondary | ICD-10-CM

## 2015-08-03 ENCOUNTER — Other Ambulatory Visit: Payer: Self-pay | Admitting: *Deleted

## 2015-08-03 ENCOUNTER — Telehealth: Payer: Self-pay | Admitting: *Deleted

## 2015-08-03 ENCOUNTER — Inpatient Hospital Stay: Payer: Medicare Other

## 2015-08-03 ENCOUNTER — Inpatient Hospital Stay: Payer: Medicare Other | Attending: Hematology and Oncology | Admitting: Hematology and Oncology

## 2015-08-03 VITALS — BP 144/72 | HR 71 | Temp 97.6°F | Resp 16 | Wt 139.4 lb

## 2015-08-03 DIAGNOSIS — Z87440 Personal history of urinary (tract) infections: Secondary | ICD-10-CM | POA: Diagnosis not present

## 2015-08-03 DIAGNOSIS — M810 Age-related osteoporosis without current pathological fracture: Secondary | ICD-10-CM | POA: Insufficient documentation

## 2015-08-03 DIAGNOSIS — Z923 Personal history of irradiation: Secondary | ICD-10-CM | POA: Insufficient documentation

## 2015-08-03 DIAGNOSIS — M1990 Unspecified osteoarthritis, unspecified site: Secondary | ICD-10-CM | POA: Diagnosis not present

## 2015-08-03 DIAGNOSIS — C50912 Malignant neoplasm of unspecified site of left female breast: Secondary | ICD-10-CM

## 2015-08-03 DIAGNOSIS — C50911 Malignant neoplasm of unspecified site of right female breast: Secondary | ICD-10-CM

## 2015-08-03 DIAGNOSIS — R5383 Other fatigue: Secondary | ICD-10-CM | POA: Insufficient documentation

## 2015-08-03 DIAGNOSIS — Z87891 Personal history of nicotine dependence: Secondary | ICD-10-CM | POA: Insufficient documentation

## 2015-08-03 DIAGNOSIS — Z7982 Long term (current) use of aspirin: Secondary | ICD-10-CM | POA: Diagnosis not present

## 2015-08-03 DIAGNOSIS — Z79899 Other long term (current) drug therapy: Secondary | ICD-10-CM | POA: Diagnosis not present

## 2015-08-03 DIAGNOSIS — Z170 Estrogen receptor positive status [ER+]: Secondary | ICD-10-CM | POA: Insufficient documentation

## 2015-08-03 DIAGNOSIS — D0510 Intraductal carcinoma in situ of unspecified breast: Secondary | ICD-10-CM | POA: Insufficient documentation

## 2015-08-03 DIAGNOSIS — D0511 Intraductal carcinoma in situ of right breast: Secondary | ICD-10-CM

## 2015-08-03 DIAGNOSIS — K589 Irritable bowel syndrome without diarrhea: Secondary | ICD-10-CM | POA: Insufficient documentation

## 2015-08-03 DIAGNOSIS — Z853 Personal history of malignant neoplasm of breast: Secondary | ICD-10-CM | POA: Diagnosis not present

## 2015-08-03 DIAGNOSIS — C50511 Malignant neoplasm of lower-outer quadrant of right female breast: Secondary | ICD-10-CM | POA: Insufficient documentation

## 2015-08-03 LAB — COMPREHENSIVE METABOLIC PANEL
ALT: 17 U/L (ref 14–54)
AST: 20 U/L (ref 15–41)
Albumin: 4.6 g/dL (ref 3.5–5.0)
Alkaline Phosphatase: 71 U/L (ref 38–126)
Anion gap: 4 — ABNORMAL LOW (ref 5–15)
BUN: 16 mg/dL (ref 6–20)
CO2: 31 mmol/L (ref 22–32)
Calcium: 9.4 mg/dL (ref 8.9–10.3)
Chloride: 99 mmol/L — ABNORMAL LOW (ref 101–111)
Creatinine, Ser: 0.84 mg/dL (ref 0.44–1.00)
GFR calc Af Amer: >60 mL/min (ref >60)
GFR calc non Af Amer: >60 mL/min (ref >60)
Glucose, Bld: 93 mg/dL (ref 65–99)
Potassium: 3.8 mmol/L (ref 3.5–5.1)
Sodium: 134 mmol/L — ABNORMAL LOW (ref 135–145)
Total Bilirubin: 0.6 mg/dL (ref 0.3–1.2)
Total Protein: 7.9 g/dL (ref 6.5–8.1)

## 2015-08-03 LAB — CBC WITH DIFFERENTIAL/PLATELET
Basophils Absolute: 0.1 10*3/uL (ref 0–0.1)
Basophils Relative: 1 %
Eosinophils Absolute: 0.1 10*3/uL (ref 0–0.7)
Eosinophils Relative: 2 %
HCT: 40.4 % (ref 35.0–47.0)
Hemoglobin: 13.5 g/dL (ref 12.0–16.0)
Lymphocytes Relative: 18 %
Lymphs Abs: 0.9 10*3/uL — ABNORMAL LOW (ref 1.0–3.6)
MCH: 29.9 pg (ref 26.0–34.0)
MCHC: 33.5 g/dL (ref 32.0–36.0)
MCV: 89.1 fL (ref 80.0–100.0)
Monocytes Absolute: 0.4 10*3/uL (ref 0.2–0.9)
Monocytes Relative: 8 %
Neutro Abs: 3.6 10*3/uL (ref 1.4–6.5)
Neutrophils Relative %: 71 %
Platelets: 128 10*3/uL — ABNORMAL LOW (ref 150–440)
RBC: 4.53 MIL/uL (ref 3.80–5.20)
RDW: 15.6 % — ABNORMAL HIGH (ref 11.5–14.5)
WBC: 5 10*3/uL (ref 3.6–11.0)

## 2015-08-03 NOTE — Progress Notes (Signed)
[facility] day:  08/03/2015  Chief Complaint: Aimee Gutierrez is a 81 y.o. female with a history of bilateral breast cancer who is seen for 1 month assessment on tamoxifen.  HPI: The patient was last seen in the medical oncology clinic on 07/02/2015.  At that time, she was seen for 1 month assessment.  Radiation to the right breast completed on 05/13/2015.  She was struggling with a recurrent UTI.  She had osteoporosis and had declined a bisphosphonate.  She was considering tamoxifen.  A prescription was provided.  The patient states that she bought the tamoxifen. She did not take it as she is "not ready to take it". She states that she needs to be "squared away".   Regarding her osteoporosis, she is taking calcium and vitamin D as well as a probiotic. She states that she went to the nutritionist and has been on the wrong diet for years. She is eating better. She denies any pain. She notes that the redness post radiation has resolved.  She still has some nipple irritation.  Past Medical History  Diagnosis Date  . Arthritis   . IBS (irritable bowel syndrome)   . Cancer 2013    with radiation, stage 1, left breast  . Breast cancer of lower-outer quadrant of right female breast 03/09/2015    3.5 mm invasive carcinoma, low-grade DCIS. ER 90%, PR greater than 50%, HER-2/neu not amplified.    Past Surgical History  Procedure Laterality Date  . Cataract extraction  2013, 2015  . Ovary surgery  2001    benign  . Breast surgery Left 2013    [facility]  . Breast surgery Right 03/09/15    wide excision    Family History  Problem Relation Age of Onset  . Arthritis Mother   . Heart disease Father   . Alzheimer's disease Sister     Social History:  reports that she quit smoking about 5 years ago. She does not have any smokeless tobacco history on file. She reports that she does not drink alcohol or use illicit drugs.  The patient is accompanied  by by her hsband today.  Allergies:  Allergies  Allergen Reactions  . Nitrofurantoin Nausea Only  . Sulfa Antibiotics Rash    Current Medications: Current Outpatient Prescriptions  Medication Sig Dispense Refill  . aspirin 81 MG tablet Take 81 mg by mouth as needed.     . calcium carbonate (TUMS - DOSED IN MG ELEMENTAL CALCIUM) 500 MG chewable tablet Chew 2 tablets by mouth daily.     . Cholecalciferol (VITAMIN D3) 1000 UNITS CAPS Take 4,000 Units by mouth daily.     Marland Kitchen ibuprofen (ADVIL,MOTRIN) 200 MG tablet Take by mouth.    . Misc Natural Products (CYSTEX) LIQD Take by mouth.    . phenazopyridine (PYRIDIUM) 100 MG tablet Take 100 mg by mouth 3 (three) times daily as needed for pain.    . Probiotic Product (HEALTHY COLON PO) Take by mouth daily.    Marland Kitchen SYNTHROID 75 MCG tablet     . tamoxifen (NOLVADEX) 20 MG tablet Take 1 tablet (20 mg total) by mouth daily. (Patient not taking: Reported on 07/06/2015) 30 tablet 3   No current facility-administered medications for this visit.    Review of Systems:  GENERAL:  Feels extremely tired in the afternoon.  Notes being a "1/2 day person". No fevers, sweats or weight loss. PERFORMANCE STATUS (ECOG):  1 HEENT:  No  visual changes, runny nose, sore throat, mouth sores or tenderness. Lungs: No shortness of breath or cough.  No hemoptysis. Cardiac:  No chest pain, palpitations, orthopnea, or PND. GI:  No nausea, vomiting, diarrhea, constipation, melena or hematochezia. GU:  No urgency, frequency, dysuria, or hematuria. Musculoskeletal:  No back pain.  No joint pain.  No muscle tenderness. Extremities:  No pain or swelling. Skin:  No rashes or skin changes. Neuro:  No headache, numbness or weakness, balance or coordination issues. Endocrine:  No diabetes, thyroid issues, hot flashes or night sweats. Psych:  No mood changes, depression or anxiety. Pain:  No focal pain. Review of systems:  All other systems reviewed and found to be  negative.  Physical Exam: Blood pressure 144/72, pulse 71, temperature 97.6 F (36.4 C), temperature source Tympanic, resp. rate 16, weight 139 lb 7.1 oz (63.251 kg). GENERAL:  Well developed, well nourished, sitting comfortably in the exam room in no acute distress.  She has a cane at her side. MENTAL STATUS:  Alert and oriented to person, place and time. HEAD:  Short gray hair.  Normocephalic, atraumatic, face symmetric, no Cushingoid features. EYES:  Hazel eyes.  Pupils equal round and reactive to light and accomodation.  No conjunctivitis or scleral icterus. ENT:  Oropharynx clear without lesion.  Tongue normal. Mucous membranes moist.  RESPIRATORY:  Clear to auscultation without rales, wheezes or rhonchi. CARDIOVASCULAR:  Regular rate and rhythm without murmur, rub or gallop. ABDOMEN:  Soft, non-tender, with active bowel sounds, and no hepatosplenomegaly.  No masses. SKIN:  No rashes, ulcers or lesions. EXTREMITIES: Arthritis changes in hands.  No edema, no skin discoloration or tenderness.  No palpable cords. LYMPH NODES: No palpable cervical, supraclavicular, axillary or inguinal adenopathy  NEUROLOGICAL: Unremarkable. PSYCH:  Appropriate.  No visits with results within 3 Day(s) from this visit. Latest known visit with results is:  Office Visit on 07/02/2015  Component Date Value Ref Range Status  . Color, Urine 07/02/2015 AMBER* YELLOW Final  . APPearance 07/02/2015 CLEAR* CLEAR Final  . Glucose, UA 07/02/2015 NEGATIVE  NEGATIVE mg/dL Final  . Bilirubin Urine 07/02/2015 NEGATIVE  NEGATIVE Final  . Ketones, ur 07/02/2015 NEGATIVE  NEGATIVE mg/dL Final  . Specific Gravity, Urine 07/02/2015 1.013  1.005 - 1.030 Final  . Hgb urine dipstick 07/02/2015 NEGATIVE  NEGATIVE Final  . pH 07/02/2015 7.0  5.0 - 8.0 Final  . Protein, ur 07/02/2015 NEGATIVE  NEGATIVE mg/dL Final  . Nitrite 07/02/2015 POSITIVE* NEGATIVE Final  . Leukocytes, UA 07/02/2015 NEGATIVE  NEGATIVE Final  . RBC /  HPF 07/02/2015 NONE SEEN  0 - 5 RBC/hpf Final  . WBC, UA 07/02/2015 0-5  0 - 5 WBC/hpf Final  . Bacteria, UA 07/02/2015 NONE SEEN  NONE SEEN Final  . Squamous Epithelial / LPF 07/02/2015 0-5* NONE SEEN Final  . Mucous 07/02/2015 PRESENT   Final    Assessment:  Aimee Gutierrez is a 81 y.o. female with a history of bilateral breast cancer. She initially presented with stage I left breast cancer s/p lumpectomy and sentinel lymph node biopsy on 03/09/2012. Initial biopsy revealed a 7 mm lesion in the left breast. Pathology revealed a 1 mm focus of residual invasive mammry carcinoma with DCIS. Stage T1aN0M0. Tumor was ER/PR+. Her2/neu was not assessed.  She received radiation from 04/26/2012 to 06/20/2012. She declined hormonal therapy.   Mammogram on 02/13/2014 revealed no evidence of maligancy. Mammogram and ultrasound on 02/16/2015 revealed a 5 x 3 x 2 mm hypoechoic mass  at the 4 o'clock position of the right breast. Core biopsy on 02/17/2015 revealed low grade DCIS, cribiform type. Estrogen receptor was positive (> 90%) and progesterone receptor positive (> 90%).   She underwent wide excision of the right breast on 03/09/2015. Pathology revealed a 3.5 mm focus of grade II invasive carcinoma with ductal carcinoma in situ. Margins were negative. No lymph nodes were removed. Tumor was ER positive (> 90%), PR positive (51-90%) and Her2/neu negative. Pathologic stage was T1aNxMx. She completed radiation on 05/13/2015.    Bone density study on 05/25/2015 revealed osteoporosis (T score of -3.3 in the femur neck and -3.5 in the forearm). She is taking calcium and vitamin D.  She is not interested in Prolia or Fosamax.  Symptomatically, she is fatigued in the afternoon.She has decided against tamoxifen.   Plan: 1. Labs today:  CBC with diff, CMP, CA27.29. 2. Discuss patient's decision to not take tamoxifen. 3. Discuss calcium and vitamin D for osteoporosis.   4. RTC in 3 months for  MD assessment, labs (CBC with diff, CMP, CA27.29)   Lequita Asal, MD  08/03/2015, 11:38 AM

## 2015-08-03 NOTE — Telephone Encounter (Signed)
Received call from hematology lab. Specimen sent to lab. Unable to process due to "cellular interference."  Must redraw patient. Notified patient regarding the need for a redraw. She will come today at 315 pm or 330 pm for redraw.  Dr. Zenia Resides made aware.

## 2015-08-04 LAB — CANCER ANTIGEN 27.29: CA 27.29: 19.2 U/mL (ref 0.0–38.6)

## 2015-08-05 ENCOUNTER — Telehealth: Payer: Self-pay

## 2015-08-05 NOTE — Telephone Encounter (Signed)
Mailing pt's labs per pt's request.

## 2015-08-10 ENCOUNTER — Encounter: Payer: Self-pay | Admitting: Hematology and Oncology

## 2015-09-16 ENCOUNTER — Ambulatory Visit (INDEPENDENT_AMBULATORY_CARE_PROVIDER_SITE_OTHER): Payer: Medicare Other | Admitting: General Surgery

## 2015-09-16 ENCOUNTER — Encounter: Payer: Self-pay | Admitting: General Surgery

## 2015-09-16 VITALS — BP 120/70 | HR 72 | Resp 12 | Ht 60.0 in | Wt 132.0 lb

## 2015-09-16 DIAGNOSIS — C50911 Malignant neoplasm of unspecified site of right female breast: Secondary | ICD-10-CM

## 2015-09-16 NOTE — Patient Instructions (Signed)
Patient to return as needed. 

## 2015-09-16 NOTE — Progress Notes (Signed)
Patient ID: Aimee Gutierrez, female   DOB: 1933/06/13, 81 y.o.   MRN: [id number]  Chief Complaint  Patient presents with  . Follow-up    breast cancer    HPI Aimee Gutierrez is a 81 y.o. female here today for her follow up breast cancer check. Patient states she never started taking her Tamoxifen.   She states her son has colon cancer. He was seen here in April 2016 with rectal bleeding/hemorrhoids. He never presented for further follow-up (exam under anesthesia) sees. HPI  Past Medical History  Diagnosis Date  . Arthritis   . IBS (irritable bowel syndrome)   . Cancer [facility]) 2013    with radiation, stage 1, left breast  . Breast cancer of lower-outer quadrant of right female breast ([place]) 03/09/2015    3.5 mm invasive carcinoma, low-grade DCIS. ER 90%, PR greater than 50%, HER-2/neu not amplified. Axillary sentinel node and radiation deferred based on age..    Past Surgical History  Procedure Laterality Date  . Cataract extraction  2013, 2015  . Ovary surgery  2001    benign  . Breast surgery Left 2013    [facility]  . Breast surgery Right 03/09/15    wide excision    Family History  Problem Relation Age of Onset  . Arthritis Mother   . Heart disease Father   . Alzheimer's disease Sister     Social History Social History  Substance Use Topics  . Smoking status: Former Smoker    Quit date: 12/05/2009  . Smokeless tobacco: None  . Alcohol Use: No    Allergies  Allergen Reactions  . Nitrofurantoin Nausea Only  . Sulfa Antibiotics Rash    Current Outpatient Prescriptions  Medication Sig Dispense Refill  . aspirin 81 MG tablet Take 81 mg by mouth as needed.     . calcium carbonate (TUMS - DOSED IN MG ELEMENTAL CALCIUM) 500 MG chewable tablet Chew 2 tablets by mouth daily.     . Cholecalciferol (VITAMIN D3) 1000 UNITS CAPS Take 4,000 Units by mouth daily.     Marland Kitchen ibuprofen (ADVIL,MOTRIN) 200 MG tablet Take by mouth.    . Misc Natural Products (CYSTEX) LIQD Take by  mouth.    . phenazopyridine (PYRIDIUM) 100 MG tablet Take 100 mg by mouth 3 (three) times daily as needed for pain.    . Probiotic Product (HEALTHY COLON PO) Take by mouth daily.    Marland Kitchen SYNTHROID 75 MCG tablet      No current facility-administered medications for this visit.    Review of Systems Review of Systems  Constitutional: Negative.   Respiratory: Negative.   Cardiovascular: Negative.     Blood pressure 120/70, pulse 72, resp. rate 12, height 5' (1.524 m), weight 132 lb (59.875 kg).  Physical Exam Physical Exam  Constitutional: She is oriented to person, place, and time. She appears well-developed and well-nourished.  Eyes: Conjunctivae are normal. No scleral icterus.  Neck: Neck supple.  Cardiovascular: Normal rate, regular rhythm and normal heart sounds.   Pulmonary/Chest: Effort normal and breath sounds normal. Right breast exhibits no inverted nipple, no mass, no nipple discharge, no skin change and no tenderness. Left breast exhibits no inverted nipple, no mass, no nipple discharge, no skin change and no tenderness.    Right breast incision is well healed.   Lymphadenopathy:    She has no cervical adenopathy.  Neurological: She is alert and oriented to person, place, and time.  Skin: Skin is warm and dry.  Data Reviewed Medical oncology records.  Assessment    Doing well status post wide excision.    Plan    The patient reports that Dr. Mike Gip has arranged for follow-up mammograms and she sees no indication to return here for further examinations.  Follow-up ear will be on an as-needed basis. Hopefully, she will follow-up with Dr. Mike Gip.     PCP:  [name] 09/17/2015, 7:34 AM

## 2015-09-17 ENCOUNTER — Encounter: Payer: Self-pay | Admitting: General Surgery

## 2015-11-06 ENCOUNTER — Other Ambulatory Visit: Payer: Self-pay

## 2015-11-06 DIAGNOSIS — C50912 Malignant neoplasm of unspecified site of left female breast: Secondary | ICD-10-CM

## 2015-11-10 ENCOUNTER — Encounter: Payer: Self-pay | Admitting: Hematology and Oncology

## 2015-11-10 ENCOUNTER — Inpatient Hospital Stay (HOSPITAL_BASED_OUTPATIENT_CLINIC_OR_DEPARTMENT_OTHER): Payer: Medicare Other | Admitting: Hematology and Oncology

## 2015-11-10 ENCOUNTER — Inpatient Hospital Stay: Payer: Medicare Other | Attending: Hematology and Oncology

## 2015-11-10 VITALS — BP 134/78 | HR 68 | Temp 96.0°F | Resp 18 | Ht 60.0 in | Wt 131.4 lb

## 2015-11-10 DIAGNOSIS — Z853 Personal history of malignant neoplasm of breast: Secondary | ICD-10-CM

## 2015-11-10 DIAGNOSIS — R5383 Other fatigue: Secondary | ICD-10-CM

## 2015-11-10 DIAGNOSIS — Z7982 Long term (current) use of aspirin: Secondary | ICD-10-CM | POA: Insufficient documentation

## 2015-11-10 DIAGNOSIS — Z923 Personal history of irradiation: Secondary | ICD-10-CM | POA: Diagnosis not present

## 2015-11-10 DIAGNOSIS — M810 Age-related osteoporosis without current pathological fracture: Secondary | ICD-10-CM | POA: Insufficient documentation

## 2015-11-10 DIAGNOSIS — C50911 Malignant neoplasm of unspecified site of right female breast: Secondary | ICD-10-CM

## 2015-11-10 DIAGNOSIS — Z79899 Other long term (current) drug therapy: Secondary | ICD-10-CM | POA: Insufficient documentation

## 2015-11-10 DIAGNOSIS — M170 Bilateral primary osteoarthritis of knee: Secondary | ICD-10-CM | POA: Diagnosis not present

## 2015-11-10 DIAGNOSIS — K589 Irritable bowel syndrome without diarrhea: Secondary | ICD-10-CM

## 2015-11-10 DIAGNOSIS — Z170 Estrogen receptor positive status [ER+]: Secondary | ICD-10-CM

## 2015-11-10 DIAGNOSIS — Z87891 Personal history of nicotine dependence: Secondary | ICD-10-CM

## 2015-11-10 DIAGNOSIS — C50912 Malignant neoplasm of unspecified site of left female breast: Secondary | ICD-10-CM

## 2015-11-10 DIAGNOSIS — C50511 Malignant neoplasm of lower-outer quadrant of right female breast: Secondary | ICD-10-CM | POA: Insufficient documentation

## 2015-11-10 LAB — COMPREHENSIVE METABOLIC PANEL
ALT: 17 U/L (ref 14–54)
AST: 18 U/L (ref 15–41)
Albumin: 4.2 g/dL (ref 3.5–5.0)
Alkaline Phosphatase: 71 U/L (ref 38–126)
Anion gap: 6 (ref 5–15)
BUN: 18 mg/dL (ref 6–20)
CO2: 29 mmol/L (ref 22–32)
Calcium: 9.3 mg/dL (ref 8.9–10.3)
Chloride: 98 mmol/L — ABNORMAL LOW (ref 101–111)
Creatinine, Ser: 0.71 mg/dL (ref 0.44–1.00)
GFR calc Af Amer: >60 mL/min (ref >60)
GFR calc non Af Amer: >60 mL/min (ref >60)
Glucose, Bld: 89 mg/dL (ref 65–99)
Potassium: 4 mmol/L (ref 3.5–5.1)
Sodium: 133 mmol/L — ABNORMAL LOW (ref 135–145)
Total Bilirubin: 0.6 mg/dL (ref 0.3–1.2)
Total Protein: 7.3 g/dL (ref 6.5–8.1)

## 2015-11-10 LAB — CBC WITH DIFFERENTIAL/PLATELET
Basophils Absolute: 0.1 10*3/uL (ref 0–0.1)
Basophils Relative: 2 %
Eosinophils Absolute: 0.1 10*3/uL (ref 0–0.7)
Eosinophils Relative: 3 %
HCT: 40.9 % (ref 35.0–47.0)
Hemoglobin: 13.3 g/dL (ref 12.0–16.0)
Lymphocytes Relative: 15 %
Lymphs Abs: 0.7 10*3/uL — ABNORMAL LOW (ref 1.0–3.6)
MCH: 29.3 pg (ref 26.0–34.0)
MCHC: 32.6 g/dL (ref 32.0–36.0)
MCV: 89.8 fL (ref 80.0–100.0)
Monocytes Absolute: 0.3 10*3/uL (ref 0.2–0.9)
Monocytes Relative: 7 %
Neutro Abs: 3.3 10*3/uL (ref 1.4–6.5)
Neutrophils Relative %: 73 %
Platelets: 136 10*3/uL — ABNORMAL LOW (ref 150–440)
RBC: 4.56 MIL/uL (ref 3.80–5.20)
RDW: 15.2 % — ABNORMAL HIGH (ref 11.5–14.5)
WBC: 4.5 10*3/uL (ref 3.6–11.0)

## 2015-11-10 NOTE — Progress Notes (Addendum)
[facility] day:  11/10/2015  Chief Complaint: Aimee Gutierrez is a 81 y.o. female with a history of bilateral breast cancer who is seen for 3 month assessment.  HPI: The patient was last seen in the medical oncology clinic on 08/03/2015.  At that time, she was seen for 1 month assessment after initiation of tamoxifen.  However, she was not on tamoxifen as she had decided after the prior appointment not to take this medication.  Symptomatically, she was fatigued.  Regarding her osteoporosis, she was taking calcium and vitamin D as well as a probiotic. She was not interested in a bisphosphonate or Prolia.  During the interim, she has done well.  She only notes issues with her knees (arthritis) and some bruising around her left knee.  She denies any trauma.  She takes aspirin and ibuprofen periodically.  She denies any other bruising or bleeding.  She denies any breast concerns.  Past Medical History  Diagnosis Date  . Arthritis   . IBS (irritable bowel syndrome)   . Cancer [facility]) 2013    with radiation, stage 1, left breast  . Breast cancer of lower-outer quadrant of right female breast ([place]) 03/09/2015    3.5 mm invasive carcinoma, low-grade DCIS. ER 90%, PR greater than 50%, HER-2/neu not amplified. Axillary sentinel node and radiation deferred based on age..    Past Surgical History  Procedure Laterality Date  . Cataract extraction  2013, 2015  . Ovary surgery  2001    benign  . Breast surgery Left 2013    [facility]  . Breast surgery Right 03/09/15    wide excision    Family History  Problem Relation Age of Onset  . Arthritis Mother   . Heart disease Father   . Alzheimer's disease Sister     Social History:  reports that she quit smoking about 5 years ago. She does not have any smokeless tobacco history on file. She reports that she does not drink alcohol or use illicit drugs.  The patient is accompanied by by her hsband  today.  Allergies:  Allergies  Allergen Reactions  . Nitrofurantoin Nausea Only  . Sulfa Antibiotics Rash    Current Medications: Current Outpatient Prescriptions  Medication Sig Dispense Refill  . aspirin 81 MG tablet Take 81 mg by mouth as needed.     . calcium carbonate (TUMS - DOSED IN MG ELEMENTAL CALCIUM) 500 MG chewable tablet Chew 2 tablets by mouth daily.     . Cholecalciferol (VITAMIN D3) 1000 UNITS CAPS Take 4,000 Units by mouth daily.     Marland Kitchen ibuprofen (ADVIL,MOTRIN) 200 MG tablet Take by mouth.    . Misc Natural Products (CYSTEX) LIQD Take by mouth.    . phenazopyridine (PYRIDIUM) 100 MG tablet Take 100 mg by mouth 3 (three) times daily as needed for pain.    . Probiotic Product (HEALTHY COLON PO) Take by mouth daily.    Marland Kitchen SYNTHROID 75 MCG tablet      No current facility-administered medications for this visit.    Review of Systems:  GENERAL:  Feels fine. No fevers, sweats or weight loss. PERFORMANCE STATUS (ECOG):  1 HEENT:  No visual changes, runny nose, sore throat, mouth sores or tenderness. Lungs: No shortness of breath or cough.  No hemoptysis. Cardiac:  No chest pain, palpitations, orthopnea, or PND. GI:  No nausea, vomiting, diarrhea, constipation, melena or hematochezia. GU:  No urgency, frequency, dysuria, or hematuria. Musculoskeletal:  Knee pain.  No back pain.  No muscle tenderness. Extremities:  No arthritis pain in hands.  No swelling. Skin:  Bruising around left knee.  No rashes or skin changes. Neuro:  No headache, numbness or weakness, balance or coordination issues. Endocrine:  No diabetes, thyroid issues, hot flashes or night sweats. Psych:  No mood changes, depression or anxiety. Pain:  No focal pain. Review of systems:  All other systems reviewed and found to be negative.  Physical Exam: Blood pressure 134/78, pulse 68, temperature 96 F (35.6 C), temperature source Tympanic, resp. rate 18, height 5' (1.524 m), weight 131 lb 6.3 oz (59.6  kg). GENERAL:  Well developed, well nourished, sitting comfortably in the exam room in no acute distress.  MENTAL STATUS:  Alert and oriented to person, place and time. HEAD:  Short gray hair.  Normocephalic, atraumatic, face symmetric, no Cushingoid features. EYES:  Hazel eyes.  Pupils equal round and reactive to light and accomodation.  No conjunctivitis or scleral icterus. ENT:  Oropharynx clear without lesion.  Tongue normal. Mucous membranes moist.  RESPIRATORY:  Clear to auscultation without rales, wheezes or rhonchi. CARDIOVASCULAR:  Regular rate and rhythm without murmur, rub or gallop. BREAST:  Right breast without masses, skin changes or nipple discharge.  Left breast without masses, skin changes or nipple discharge.  Inverted nipples (chronic). ABDOMEN:  Soft, non-tender, with active bowel sounds, and no hepatosplenomegaly.  No masses. SKIN:  Areas of hyperpigmentation around left knee at prior areas of ecchymosis.  No rashes, ulcers or lesions. EXTREMITIES: Arthritis changes in hands.  No edema, no skin discoloration or tenderness.  No palpable cords. LYMPH NODES: No palpable cervical, supraclavicular, axillary or inguinal adenopathy  NEUROLOGICAL: Unremarkable. PSYCH:  Appropriate.  Appointment on 11/10/2015  Component Date Value Ref Range Status  . WBC 11/10/2015 4.5  3.6 - 11.0 K/uL Final  . RBC 11/10/2015 4.56  3.80 - 5.20 MIL/uL Final  . Hemoglobin 11/10/2015 13.3  12.0 - 16.0 g/dL Final  . HCT 11/10/2015 40.9  35.0 - 47.0 % Final  . MCV 11/10/2015 89.8  80.0 - 100.0 fL Final  . MCH 11/10/2015 29.3  26.0 - 34.0 pg Final  . MCHC 11/10/2015 32.6  32.0 - 36.0 g/dL Final  . RDW 11/10/2015 15.2* 11.5 - 14.5 % Final  . Platelets 11/10/2015 136* 150 - 440 K/uL Final  . Neutrophils Relative % 11/10/2015 73   Final  . Neutro Abs 11/10/2015 3.3  1.4 - 6.5 K/uL Final  . Lymphocytes Relative 11/10/2015 15   Final  . Lymphs Abs 11/10/2015 0.7* 1.0 - 3.6 K/uL Final  . Monocytes  Relative 11/10/2015 7   Final  . Monocytes Absolute 11/10/2015 0.3  0.2 - 0.9 K/uL Final  . Eosinophils Relative 11/10/2015 3   Final  . Eosinophils Absolute 11/10/2015 0.1  0 - 0.7 K/uL Final  . Basophils Relative 11/10/2015 2   Final  . Basophils Absolute 11/10/2015 0.1  0 - 0.1 K/uL Final  . Sodium 11/10/2015 133* 135 - 145 mmol/L Final  . Potassium 11/10/2015 4.0  3.5 - 5.1 mmol/L Final  . Chloride 11/10/2015 98* 101 - 111 mmol/L Final  . CO2 11/10/2015 29  22 - 32 mmol/L Final  . Glucose, Bld 11/10/2015 89  65 - 99 mg/dL Final  . BUN 11/10/2015 18  6 - 20 mg/dL Final  . Creatinine, Ser 11/10/2015 0.71  0.44 - 1.00 mg/dL Final  . Calcium 11/10/2015 9.3  8.9 - 10.3 mg/dL Final  .  Total Protein 11/10/2015 7.3  6.5 - 8.1 g/dL Final  . Albumin 11/10/2015 4.2  3.5 - 5.0 g/dL Final  . AST 11/10/2015 18  15 - 41 U/L Final  . ALT 11/10/2015 17  14 - 54 U/L Final  . Alkaline Phosphatase 11/10/2015 71  38 - 126 U/L Final  . Total Bilirubin 11/10/2015 0.6  0.3 - 1.2 mg/dL Final  . GFR calc non Af Amer 11/10/2015 >60  >60 mL/min Final  . GFR calc Af Amer 11/10/2015 >60  >60 mL/min Final   Comment: (NOTE) The eGFR has been calculated using the CKD EPI equation. This calculation has not been validated in all clinical situations. eGFR's persistently <60 mL/min signify possible Chronic Kidney Disease.   Georgiann Hahn gap 11/10/2015 6  5 - 15 Final    Assessment:  Aimee Gutierrez is a 81 y.o. female with a history of bilateral breast cancer. She initially presented with stage I left breast cancer s/p lumpectomy and sentinel lymph node biopsy on 03/09/2012. Initial biopsy revealed a 7 mm lesion in the left breast. Pathology revealed a 1 mm focus of residual invasive mammry carcinoma with DCIS. Stage T1aN0M0. Tumor was ER/PR+. Her2/neu was not assessed.  She received radiation from 04/26/2012 to 06/20/2012. She declined hormonal therapy.   Mammogram on 02/13/2014 revealed no evidence of  maligancy. Mammogram and ultrasound on 02/16/2015 revealed a 5 x 3 x 2 mm hypoechoic mass at the 4 o'clock position of the right breast. Core biopsy on 02/17/2015 revealed low grade DCIS, cribiform type. Estrogen receptor was positive (> 90%) and progesterone receptor positive (> 90%).   She underwent wide excision of the right breast on 03/09/2015. Pathology revealed a 3.5 mm focus of grade II invasive carcinoma with ductal carcinoma in situ. Margins were negative. No lymph nodes were removed. Tumor was ER positive (> 90%), PR positive (51-90%) and Her2/neu negative. Pathologic stage was T1aNxMx. She completed radiation on 05/13/2015.  She decided against tamoxifen.  Bone density study on 05/25/2015 revealed osteoporosis (T score of -3.3 in the femur neck and -3.5 in the forearm). She is taking calcium and vitamin D.  She is not interested in Prolia or Fosamax.  Symptomatically, she is is doing well.  She has arthritis in her knees.  Exam is stable.  Plan: 1. Labs today:  CBC with diff, CMP, CA27.29. 2. Continue calcium and vitamin D for osteoporosis.   3. Bilateral mammogram 02/18/2016. 4. RTC in 4 months for MD assessment, labs (CBC with diff, CMP, CA27.29)   Lequita Asal, MD  11/10/2015, 11:20 AM

## 2015-11-10 NOTE — Progress Notes (Signed)
Patient is here for follow-up of breast cancer. She states that overall she has been doing ok.

## 2015-11-11 LAB — CANCER ANTIGEN 27.29: CA 27.29: 11.8 U/mL (ref 0.0–38.6)

## 2015-11-12 ENCOUNTER — Other Ambulatory Visit: Payer: Self-pay

## 2015-11-12 DIAGNOSIS — C50912 Malignant neoplasm of unspecified site of left female breast: Secondary | ICD-10-CM

## 2015-12-03 DIAGNOSIS — E7800 Pure hypercholesterolemia, unspecified: Secondary | ICD-10-CM | POA: Insufficient documentation

## 2016-01-06 ENCOUNTER — Encounter: Payer: Self-pay | Admitting: Radiation Oncology

## 2016-01-06 ENCOUNTER — Ambulatory Visit
Admission: RE | Admit: 2016-01-06 | Discharge: 2016-01-06 | Disposition: A | Discharge status: Home / Self Care | Payer: Medicare Other | Source: Ambulatory Visit | Attending: Radiation Oncology | Admitting: Radiation Oncology

## 2016-01-06 VITALS — BP 120/71 | HR 88 | Temp 96.6°F | Resp 18 | Wt 131.2 lb

## 2016-01-06 DIAGNOSIS — C50911 Malignant neoplasm of unspecified site of right female breast: Secondary | ICD-10-CM

## 2016-01-06 NOTE — Progress Notes (Signed)
Radiation Oncology Follow up Note  Name: Aimee Gutierrez   Date:   01/06/2016 MRN:  [id number] DOB: [birth year]    This 82 y.o. female presents to the clinic today for follow-up breast cancer stage IA (T1 N0 M0) ER/PR positive status post will wide local excision and whole breast radiation to her right breast now out 7 months. Patient completed radiation to her left breast in 2013 for a T1 1 lesion.Marland Kitchen  REFERRING PROVIDER: Adin Hector, MD  HPI: Patient is a 82 year old female now out 7 months having completed radiation therapy to her right breast for a T1 N0 M0 ER/PR positive invasive mammary carcinoma status post wide local excision. She's also status post radiation therapy to her left breast back in 2013. She is seen today in routine follow-up and is doing well. She specifically denies breast tenderness cough or bone pain. Had been offered aromatase inhibitor therapy has declined to take it. She has a mammogram scheduled in March..  COMPLICATIONS OF TREATMENT: none  FOLLOW UP COMPLIANCE: keeps appointments   PHYSICAL EXAM:  BP 120/71 mmHg  Pulse 88  Temp(Src) 96.6 F (35.9 C)  Resp 18  Wt 131 lb 2.8 oz (59.5 kg) Lungs are clear to A&P cardiac examination essentially unremarkable with regular rate and rhythm. No dominant mass or nodularity is noted in either breast in 2 positions examined. Incision is well-healed. No axillary or supraclavicular adenopathy is appreciated. Cosmetic result is excellent. Well-developed well-nourished patient in NAD. HEENT reveals PERLA, EOMI, discs not visualized.  Oral cavity is clear. No oral mucosal lesions are identified. Neck is clear without evidence of cervical or supraclavicular adenopathy. Lungs are clear to A&P. Cardiac examination is essentially unremarkable with regular rate and rhythm without murmur rub or thrill. Abdomen is benign with no organomegaly or masses noted. Motor sensory and DTR levels are equal and symmetric in the upper and lower  extremities. Cranial nerves II through XII are grossly intact. Proprioception is intact. No peripheral adenopathy or edema is identified. No motor or sensory levels are noted. Crude visual fields are within normal range.  RADIOLOGY RESULTS: Mammograms scheduled for March  PLAN: Present time she continues to do well with no evidence of disease. We had discussion today about aromatase inhibitor therapy and my recommendations. Patient does have a prescription home am sure of the drug she's been prescribed although I have told her she can always stop taking it 6 she developed side effects. Otherwise I'm please were overall progress I've asked to see her back in 1 year for follow-up. Patient is to call with any concerns.  I would like to take this opportunity for allowing me to participate in the care of your patient.Armstead Peaks., MD

## 2016-02-18 ENCOUNTER — Ambulatory Visit
Admission: RE | Admit: 2016-02-18 | Discharge: 2016-02-18 | Disposition: A | Discharge status: Home / Self Care | Payer: Medicare Other | Source: Ambulatory Visit | Attending: Hematology and Oncology | Admitting: Hematology and Oncology

## 2016-02-18 ENCOUNTER — Other Ambulatory Visit: Payer: Self-pay | Admitting: Hematology and Oncology

## 2016-02-18 DIAGNOSIS — C50912 Malignant neoplasm of unspecified site of left female breast: Secondary | ICD-10-CM

## 2016-02-18 DIAGNOSIS — Z853 Personal history of malignant neoplasm of breast: Secondary | ICD-10-CM | POA: Insufficient documentation

## 2016-02-18 HISTORY — DX: Malignant neoplasm of unspecified site of unspecified female breast: C50.919

## 2016-04-12 ENCOUNTER — Inpatient Hospital Stay: Payer: Medicare Other | Admitting: Hematology and Oncology

## 2016-04-12 ENCOUNTER — Inpatient Hospital Stay: Payer: Medicare Other

## 2016-04-29 ENCOUNTER — Inpatient Hospital Stay: Payer: Medicare Other | Attending: Hematology and Oncology

## 2016-04-29 ENCOUNTER — Inpatient Hospital Stay (HOSPITAL_BASED_OUTPATIENT_CLINIC_OR_DEPARTMENT_OTHER): Payer: Medicare Other | Admitting: Hematology and Oncology

## 2016-04-29 VITALS — BP 128/77 | HR 69 | Temp 95.1°F | Resp 18 | Ht 60.0 in | Wt 128.0 lb

## 2016-04-29 DIAGNOSIS — C50912 Malignant neoplasm of unspecified site of left female breast: Secondary | ICD-10-CM

## 2016-04-29 DIAGNOSIS — Z79899 Other long term (current) drug therapy: Secondary | ICD-10-CM | POA: Insufficient documentation

## 2016-04-29 DIAGNOSIS — Z853 Personal history of malignant neoplasm of breast: Secondary | ICD-10-CM

## 2016-04-29 DIAGNOSIS — H532 Diplopia: Secondary | ICD-10-CM

## 2016-04-29 DIAGNOSIS — Z170 Estrogen receptor positive status [ER+]: Secondary | ICD-10-CM | POA: Diagnosis not present

## 2016-04-29 DIAGNOSIS — Z87891 Personal history of nicotine dependence: Secondary | ICD-10-CM | POA: Insufficient documentation

## 2016-04-29 DIAGNOSIS — Z7982 Long term (current) use of aspirin: Secondary | ICD-10-CM | POA: Insufficient documentation

## 2016-04-29 DIAGNOSIS — K589 Irritable bowel syndrome without diarrhea: Secondary | ICD-10-CM | POA: Diagnosis not present

## 2016-04-29 DIAGNOSIS — M170 Bilateral primary osteoarthritis of knee: Secondary | ICD-10-CM | POA: Diagnosis not present

## 2016-04-29 DIAGNOSIS — R42 Dizziness and giddiness: Secondary | ICD-10-CM | POA: Insufficient documentation

## 2016-04-29 DIAGNOSIS — C50511 Malignant neoplasm of lower-outer quadrant of right female breast: Secondary | ICD-10-CM | POA: Insufficient documentation

## 2016-04-29 DIAGNOSIS — Z923 Personal history of irradiation: Secondary | ICD-10-CM

## 2016-04-29 DIAGNOSIS — Z803 Family history of malignant neoplasm of breast: Secondary | ICD-10-CM | POA: Diagnosis not present

## 2016-04-29 LAB — CBC WITH DIFFERENTIAL/PLATELET
Basophils Absolute: 0 10*3/uL (ref 0–0.1)
Basophils Relative: 1 %
Eosinophils Absolute: 0.1 10*3/uL (ref 0–0.7)
Eosinophils Relative: 2 %
HCT: 39.1 % (ref 35.0–47.0)
Hemoglobin: 13.1 g/dL (ref 12.0–16.0)
Lymphocytes Relative: 16 %
Lymphs Abs: 0.8 10*3/uL — ABNORMAL LOW (ref 1.0–3.6)
MCH: 29.7 pg (ref 26.0–34.0)
MCHC: 33.7 g/dL (ref 32.0–36.0)
MCV: 88.1 fL (ref 80.0–100.0)
Monocytes Absolute: 0.4 10*3/uL (ref 0.2–0.9)
Monocytes Relative: 7 %
Neutro Abs: 3.7 10*3/uL (ref 1.4–6.5)
Neutrophils Relative %: 74 %
Platelets: 115 10*3/uL — ABNORMAL LOW (ref 150–440)
RBC: 4.43 MIL/uL (ref 3.80–5.20)
RDW: 15.4 % — ABNORMAL HIGH (ref 11.5–14.5)
WBC: 5 10*3/uL (ref 3.6–11.0)

## 2016-04-29 LAB — COMPREHENSIVE METABOLIC PANEL
ALT: 15 U/L (ref 14–54)
AST: 17 U/L (ref 15–41)
Albumin: 4.3 g/dL (ref 3.5–5.0)
Alkaline Phosphatase: 70 U/L (ref 38–126)
Anion gap: 5 (ref 5–15)
BUN: 18 mg/dL (ref 6–20)
CO2: 30 mmol/L (ref 22–32)
Calcium: 9.4 mg/dL (ref 8.9–10.3)
Chloride: 102 mmol/L (ref 101–111)
Creatinine, Ser: 0.84 mg/dL (ref 0.44–1.00)
GFR calc Af Amer: >60 mL/min (ref >60)
GFR calc non Af Amer: >60 mL/min (ref >60)
Glucose, Bld: 96 mg/dL (ref 65–99)
Potassium: 4.2 mmol/L (ref 3.5–5.1)
Sodium: 137 mmol/L (ref 135–145)
Total Bilirubin: 0.6 mg/dL (ref 0.3–1.2)
Total Protein: 7.3 g/dL (ref 6.5–8.1)

## 2016-04-29 NOTE — Progress Notes (Signed)
[facility] day:  04/29/2016  Chief Complaint: Aimee Gutierrez is a 82 y.o. female with a history of bilateral breast cancer who is seen for 5 month assessment.  HPI: The patient was last seen in the medical oncology clinic on 11/10/2015.  At that time, she was doing well.  She had arthritis in her knees.  Exam was stable.  Labs included a normal CBC with diff (except for a platelet count of 136,00), CMP, and CA27.29 (11.8).  During the interim, she lost her son on 2016/03/23 and her husband (TD) on 2016-04-06.  She denies any symptoms except for an episode of dizziness 2 days after his death.  She described brief double vision.  She describes drinking water and her symptoms resolving.  She may have been dehydrated.  Events happened while riding in a car.  She describes her head being "foggy" sometimes.  Bilateral mammogram on 02/18/2016 revealed no evidence of malignancy.  She denies any breast symptoms or other concerns.   Past Medical History  Diagnosis Date  . Arthritis   . IBS (irritable bowel syndrome)   . Cancer [facility]) 2013    with radiation, stage 1, left breast  . Breast cancer of lower-outer quadrant of right female breast ([place]) 03/09/2015    3.5 mm invasive carcinoma, low-grade DCIS. ER 90%, PR greater than 50%, HER-2/neu not amplified. Axillary sentinel node and radiation deferred based on age..  . Breast cancer ([place]) 03/09/15    right breast, radiation  . Breast cancer [facility]) 2013    left breast, radiation    Past Surgical History  Procedure Laterality Date  . Cataract extraction  2013, 2015  . Ovary surgery  2001    benign  . Breast surgery Left 2013    [facility]  . Breast surgery Right 03/09/15    wide excision  . Breast biopsy Right 02/17/15    positive    Family History  Problem Relation Age of Onset  . Arthritis Mother   . Heart disease Father   . Alzheimer's disease Sister   . Breast cancer Paternal Grandmother      Social History:  reports that she quit smoking about 6 years ago. She does not have any smokeless tobacco history on file. She reports that she does not drink alcohol or use illicit drugs.  He son died on 23-Mar-2016 and her husband died on 04/06/16.  The patient is alone today.  Allergies:  Allergies  Allergen Reactions  . Nitrofurantoin Nausea Only  . Sulfa Antibiotics Rash    Current Medications: Current Outpatient Prescriptions  Medication Sig Dispense Refill  . aspirin 81 MG tablet Take 81 mg by mouth as needed.     . calcium carbonate (TUMS - DOSED IN MG ELEMENTAL CALCIUM) 500 MG chewable tablet Chew 2 tablets by mouth daily.     . Cholecalciferol (VITAMIN D3) 1000 UNITS CAPS Take 4,000 Units by mouth daily.     Marland Kitchen ibuprofen (ADVIL,MOTRIN) 200 MG tablet Take by mouth. Reported on 04/29/2016    . Probiotic Product (HEALTHY COLON PO) Take by mouth daily.    Marland Kitchen SYNTHROID 75 MCG tablet     . Misc Natural Products (CYSTEX) LIQD Take by mouth. Reported on 04/29/2016    . phenazopyridine (PYRIDIUM) 100 MG tablet Take 100 mg by mouth 3 (three) times daily as needed for pain. Reported on 04/29/2016     No current facility-administered medications for this visit.  Review of Systems:  GENERAL:  Feels "ok". No fevers or sweats. Weight up 4 pounds. PERFORMANCE STATUS (ECOG):  1 HEENT:  Brief diplopia (see HPI).  No visual changes, runny nose, sore throat, mouth sores or tenderness. Lungs: No shortness of breath or cough.  No hemoptysis. Cardiac:  No chest pain, palpitations, orthopnea, or PND. GI:  No nausea, vomiting, diarrhea, constipation, melena or hematochezia. GU:  No urgency, frequency, dysuria, or hematuria. Musculoskeletal:  Knee pain.  No back pain.  No muscle tenderness. Extremities:  No arthritis pain in hands.  No swelling. Skin:  No rashes or skin changes. Neuro:  Dizzy spell (see HPI).  No headache, numbness or weakness, balance or coordination issues. Endocrine:  No  diabetes, thyroid issues, hot flashes or night sweats. Psych:  No mood changes, depression or anxiety. Pain:  No focal pain. Review of systems:  All other systems reviewed and found to be negative.  Physical Exam: Blood pressure 128/77, pulse 69, temperature 95.1 F (35.1 C), temperature source Tympanic, resp. rate 18, height 5' (1.524 m), weight 127 lb 15.6 oz (58.05 kg). GENERAL:  Well developed, well nourished, sitting comfortably in the exam room in no acute distress.   She is tearful at times. MENTAL STATUS:  Alert and oriented to person, place and time. HEAD:  Short gray hair.  Normocephalic, atraumatic, face symmetric, no Cushingoid features. EYES:  Hazel eyes.  Pupils equal round and reactive to light and accomodation.  No conjunctivitis or scleral icterus. ENT:  Oropharynx clear without lesion.  Tongue normal. Mucous membranes moist.  NECK:  No murmur appreciated. RESPIRATORY:  Clear to auscultation without rales, wheezes or rhonchi. CARDIOVASCULAR:  Regular rate and rhythm without murmur, rub or gallop. BREAST:  Right breast without masses, skin changes or nipple discharge.  Left breast without masses, skin changes or nipple discharge.  Inverted nipples (chronic). ABDOMEN:  Soft, non-tender, with active bowel sounds, and no hepatosplenomegaly.  No masses. SKIN:  Areas of hyperpigmentation around left knee at prior areas of ecchymosis.  No rashes, ulcers or lesions. EXTREMITIES: Arthritis changes in hands.  No edema, no skin discoloration or tenderness.  No palpable cords. LYMPH NODES: No palpable cervical, supraclavicular, axillary or inguinal adenopathy  NEUROLOGICAL: Unremarkable. PSYCH:  Appropriate.  Appointment on 04/29/2016  Component Date Value Ref Range Status  . WBC 04/29/2016 5.0  3.6 - 11.0 K/uL Final  . RBC 04/29/2016 4.43  3.80 - 5.20 MIL/uL Final  . Hemoglobin 04/29/2016 13.1  12.0 - 16.0 g/dL Final  . HCT 04/29/2016 39.1  35.0 - 47.0 % Final  . MCV 04/29/2016  88.1  80.0 - 100.0 fL Final  . MCH 04/29/2016 29.7  26.0 - 34.0 pg Final  . MCHC 04/29/2016 33.7  32.0 - 36.0 g/dL Final  . RDW 04/29/2016 15.4* 11.5 - 14.5 % Final  . Platelets 04/29/2016 115* 150 - 440 K/uL Final  . Neutrophils Relative % 04/29/2016 74   Final  . Neutro Abs 04/29/2016 3.7  1.4 - 6.5 K/uL Final  . Lymphocytes Relative 04/29/2016 16   Final  . Lymphs Abs 04/29/2016 0.8* 1.0 - 3.6 K/uL Final  . Monocytes Relative 04/29/2016 7   Final  . Monocytes Absolute 04/29/2016 0.4  0.2 - 0.9 K/uL Final  . Eosinophils Relative 04/29/2016 2   Final  . Eosinophils Absolute 04/29/2016 0.1  0 - 0.7 K/uL Final  . Basophils Relative 04/29/2016 1   Final  . Basophils Absolute 04/29/2016 0.0  0 - 0.1 K/uL Final  .  Sodium 04/29/2016 137  135 - 145 mmol/L Final  . Potassium 04/29/2016 4.2  3.5 - 5.1 mmol/L Final  . Chloride 04/29/2016 102  101 - 111 mmol/L Final  . CO2 04/29/2016 30  22 - 32 mmol/L Final  . Glucose, Bld 04/29/2016 96  65 - 99 mg/dL Final  . BUN 04/29/2016 18  6 - 20 mg/dL Final  . Creatinine, Ser 04/29/2016 0.84  0.44 - 1.00 mg/dL Final  . Calcium 04/29/2016 9.4  8.9 - 10.3 mg/dL Final  . Total Protein 04/29/2016 7.3  6.5 - 8.1 g/dL Final  . Albumin 04/29/2016 4.3  3.5 - 5.0 g/dL Final  . AST 04/29/2016 17  15 - 41 U/L Final  . ALT 04/29/2016 15  14 - 54 U/L Final  . Alkaline Phosphatase 04/29/2016 70  38 - 126 U/L Final  . Total Bilirubin 04/29/2016 0.6  0.3 - 1.2 mg/dL Final  . GFR calc non Af Amer 04/29/2016 >60  >60 mL/min Final  . GFR calc Af Amer 04/29/2016 >60  >60 mL/min Final   Comment: (NOTE) The eGFR has been calculated using the CKD EPI equation. This calculation has not been validated in all clinical situations. eGFR's persistently <60 mL/min signify possible Chronic Kidney Disease.   Georgiann Hahn gap 04/29/2016 5  5 - 15 Final    Assessment:  Aimee Gutierrez is a 82 y.o. female with a history of bilateral breast cancer. She initially presented with stage I  left breast cancer s/p lumpectomy and sentinel lymph node biopsy on 03/09/2012. Initial biopsy revealed a 7 mm lesion in the left breast. Pathology revealed a 1 mm focus of residual invasive mammry carcinoma with DCIS. Stage T1aN0M0. Tumor was ER/PR+. Her2/neu was not assessed.  She received radiation from 04/26/2012 to 06/20/2012. She declined hormonal therapy.   Mammogram on 02/13/2014 revealed no evidence of maligancy. Mammogram and ultrasound on 02/16/2015 revealed a 5 x 3 x 2 mm hypoechoic mass at the 4 o'clock position of the right breast. Core biopsy on 02/17/2015 revealed low grade DCIS, cribiform type. Estrogen receptor was positive (> 90%) and progesterone receptor positive (> 90%).   She underwent wide excision of the right breast on 03/09/2015. Pathology revealed a 3.5 mm focus of grade II invasive carcinoma with ductal carcinoma in situ. Margins were negative. No lymph nodes were removed. Tumor was ER positive (> 90%), PR positive (51-90%) and Her2/neu negative. Pathologic stage was T1aNxMx. She completed radiation on 05/13/2015.  She decided against tamoxifen.  Bilateral mammogram on 02/18/2016 revealed no evidence of malignancy.  CA27.29 was 14.3 on 04/29/2016.  Bone density study on 05/25/2015 revealed osteoporosis (T score of -3.3 in the femur neck and -3.5 in the forearm). She is taking calcium and vitamin D.  She is not interested in Prolia or Fosamax.  She has mild persistent thrombocytopenia.  Platelet count has ranged between 96,000 - 161,000 without trend.  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  Symptomatically, she describes a dizzy spell and episodic "fogginess" since the death of her son and husband.  Exam is stable.  Plan: 1.  Labs today:  CBC with diff, CMP, CA27.29. 2.  Review interval mammogram. 3.  Discuss follow-up with Dr. Ramonita Lab regarding recent spells. 4.  Continue calcium and vitamin D for osteoporosis.   5.  RTC in 4 months for MD assessment,  labs (CBC with diff, CMP, CA27.29)   Lequita Asal, MD  04/29/2016, 12:08 PM

## 2016-04-29 NOTE — Progress Notes (Signed)
Pt very emotional today regarding loss of son on 4/15 and her husband on 04/02/2016.  Pt refuses the need for any additional emotional support from cancer center.  Pt reports being dizzy after these events and fuzzy headed and even had double vision.  Pt had a fall last Tuesday night and got tangled up in the stool fell on left side. Pt reported she did not call 911 and did not feel the need to go to the ER. Pt reports she does not take tamoxifen and never has.

## 2016-04-30 ENCOUNTER — Encounter: Payer: Self-pay | Admitting: Hematology and Oncology

## 2016-04-30 LAB — CANCER ANTIGEN 27.29: CA 27.29: 14.3 U/mL (ref 0.0–38.6)

## 2016-06-02 ENCOUNTER — Encounter: Payer: Self-pay | Admitting: Emergency Medicine

## 2016-06-02 ENCOUNTER — Emergency Department
Admission: EM | Admit: 2016-06-02 | Discharge: 2016-06-02 | Disposition: A | Discharge status: Home / Self Care | Payer: Medicare Other | Attending: Emergency Medicine | Admitting: Emergency Medicine

## 2016-06-02 ENCOUNTER — Emergency Department: Payer: Medicare Other

## 2016-06-02 DIAGNOSIS — Z87891 Personal history of nicotine dependence: Secondary | ICD-10-CM | POA: Diagnosis not present

## 2016-06-02 DIAGNOSIS — Z79899 Other long term (current) drug therapy: Secondary | ICD-10-CM | POA: Insufficient documentation

## 2016-06-02 DIAGNOSIS — Z7982 Long term (current) use of aspirin: Secondary | ICD-10-CM | POA: Diagnosis not present

## 2016-06-02 DIAGNOSIS — Z791 Long term (current) use of non-steroidal anti-inflammatories (NSAID): Secondary | ICD-10-CM | POA: Insufficient documentation

## 2016-06-02 DIAGNOSIS — L03011 Cellulitis of right finger: Secondary | ICD-10-CM | POA: Diagnosis not present

## 2016-06-02 DIAGNOSIS — M810 Age-related osteoporosis without current pathological fracture: Secondary | ICD-10-CM | POA: Diagnosis not present

## 2016-06-02 DIAGNOSIS — Z853 Personal history of malignant neoplasm of breast: Secondary | ICD-10-CM | POA: Insufficient documentation

## 2016-06-02 DIAGNOSIS — M19042 Primary osteoarthritis, left hand: Secondary | ICD-10-CM

## 2016-06-02 DIAGNOSIS — L03012 Cellulitis of left finger: Secondary | ICD-10-CM

## 2016-06-02 DIAGNOSIS — M79641 Pain in right hand: Secondary | ICD-10-CM | POA: Diagnosis present

## 2016-06-02 DIAGNOSIS — E039 Hypothyroidism, unspecified: Secondary | ICD-10-CM | POA: Insufficient documentation

## 2016-06-02 DIAGNOSIS — M19041 Primary osteoarthritis, right hand: Secondary | ICD-10-CM | POA: Insufficient documentation

## 2016-06-02 LAB — CBC WITH DIFFERENTIAL/PLATELET
BASOS ABS: 0.1 10*3/uL (ref 0–0.1)
BASOS PCT: 1 %
EOS ABS: 0.2 10*3/uL (ref 0–0.7)
Eosinophils Relative: 2 %
HCT: 36.5 % (ref 35.0–47.0)
HEMOGLOBIN: 12.2 g/dL (ref 12.0–16.0)
LYMPHS ABS: 0.7 10*3/uL — AB (ref 1.0–3.6)
Lymphocytes Relative: 11 %
MCH: 29.1 pg (ref 26.0–34.0)
MCHC: 33.4 g/dL (ref 32.0–36.0)
MCV: 87.2 fL (ref 80.0–100.0)
Monocytes Absolute: 0.6 10*3/uL (ref 0.2–0.9)
Monocytes Relative: 9 %
NEUTROS PCT: 77 %
Neutro Abs: 5.2 10*3/uL (ref 1.4–6.5)
Platelets: 108 10*3/uL — ABNORMAL LOW (ref 150–440)
RBC: 4.19 MIL/uL (ref 3.80–5.20)
RDW: 15.8 % — ABNORMAL HIGH (ref 11.5–14.5)
WBC: 6.8 10*3/uL (ref 3.6–11.0)

## 2016-06-02 LAB — COMPREHENSIVE METABOLIC PANEL
ALT: 12 U/L — AB (ref 14–54)
AST: 15 U/L (ref 15–41)
Albumin: 4 g/dL (ref 3.5–5.0)
Alkaline Phosphatase: 69 U/L (ref 38–126)
Anion gap: 7 (ref 5–15)
BILIRUBIN TOTAL: 0.6 mg/dL (ref 0.3–1.2)
BUN: 18 mg/dL (ref 6–20)
CHLORIDE: 103 mmol/L (ref 101–111)
CO2: 27 mmol/L (ref 22–32)
CREATININE: 0.76 mg/dL (ref 0.44–1.00)
Calcium: 9.4 mg/dL (ref 8.9–10.3)
Glucose, Bld: 91 mg/dL (ref 65–99)
Potassium: 4.3 mmol/L (ref 3.5–5.1)
Sodium: 137 mmol/L (ref 135–145)
TOTAL PROTEIN: 6.9 g/dL (ref 6.5–8.1)

## 2016-06-02 MED ORDER — CLINDAMYCIN HCL 150 MG PO CAPS: ORAL_CAPSULE | ORAL | Status: DC

## 2016-06-02 MED ORDER — COLCHICINE 0.6 MG PO TABS: 0.6000 mg | ORAL_TABLET | Freq: Two times a day (BID) | ORAL | Status: DC

## 2016-06-02 MED ORDER — ACETAMINOPHEN 325 MG PO TABS
650.0000 mg | ORAL_TABLET | Freq: Once | ORAL | Status: AC
  Administered 2016-06-02: 650 mg via ORAL
  Filled 2016-06-02: qty 2

## 2016-06-02 NOTE — ED Provider Notes (Signed)
[facility] Emergency Department Provider Note  ____________________________________________  Time seen: Approximately 11:00 AM  I have reviewed the triage vital signs and the nursing notes.   HISTORY  Chief Complaint Hand Pain   HPI Aimee Gutierrez is a 82 y.o. female is here with complaint of right hand pain and swelling for several days. Patient denies any history of injury and thought this was an arthritis flareup and has been taking ibuprofen without any relief. Patient states that on a regular basis some of her joints are deformed due to her arthritis. This morning she saw redness on her right hand and drove herself to the emergency room. She denies any fever or chills. She denies any previous skin infections.Currently she rates her pain as a 10 over 10.   Past Medical History  Diagnosis Date  . Arthritis   . IBS (irritable bowel syndrome)   . Cancer [facility]) 2013    with radiation, stage 1, left breast  . Breast cancer of lower-outer quadrant of right female breast ([place]) 03/09/2015    3.5 mm invasive carcinoma, low-grade DCIS. ER 90%, PR greater than 50%, HER-2/neu not amplified. Axillary sentinel node and radiation deferred based on age..  . Breast cancer ([place]) 03/09/15    right breast, radiation  . Breast cancer [facility]) 2013    left breast, radiation    Patient Active Problem List   Diagnosis Date Noted  . Carcinoma in situ, breast, ductal 08/03/2015  . Acid indigestion 07/02/2015  . Adult hypothyroidism 07/02/2015  . Arthritis, degenerative 07/02/2015  . Change in blood platelet count 07/02/2015  . Osteoporosis 06/04/2015  . Invasive ductal carcinoma of right breast, stage 1 ([place]) 03/13/2015  . Invasive ductal carcinoma of left breast, stage 1 ([place]) 02/26/2015    Past Surgical History  Procedure Laterality Date  . Cataract extraction  2013, 2015  . Ovary surgery  2001    benign  . Breast surgery Left 2013    [facility]  . Breast  surgery Right 03/09/15    wide excision  . Breast biopsy Right 02/17/15    positive    Current Outpatient Rx  Name  Route  Sig  Dispense  Refill  . aspirin 81 MG tablet   Oral   Take 81 mg by mouth as needed.          . calcium carbonate (TUMS - DOSED IN MG ELEMENTAL CALCIUM) 500 MG chewable tablet   Oral   Chew 2 tablets by mouth daily.          . Cholecalciferol (VITAMIN D3) 1000 UNITS CAPS   Oral   Take 4,000 Units by mouth daily.          . clindamycin (CLEOCIN) 150 MG capsule      Take one tablet qid for 7 days   28 capsule   0   . colchicine 0.6 MG tablet   Oral   Take 1 tablet (0.6 mg total) by mouth 2 (two) times daily.   20 tablet   2   . ibuprofen (ADVIL,MOTRIN) 200 MG tablet   Oral   Take by mouth. Reported on 04/29/2016         . Misc Natural Products (CYSTEX) LIQD   Oral   Take by mouth. Reported on 04/29/2016         . phenazopyridine (PYRIDIUM) 100 MG tablet   Oral   Take 100 mg by mouth 3 (three) times daily as needed for pain.  Reported on 04/29/2016         . Probiotic Product (HEALTHY COLON PO)   Oral   Take by mouth daily.         Marland Kitchen SYNTHROID 75 MCG tablet                 Dispense as written.     Allergies Nitrofurantoin and Sulfa antibiotics  Family History  Problem Relation Age of Onset  . Arthritis Mother   . Heart disease Father   . Alzheimer's disease Sister   . Breast cancer Paternal Grandmother     Social History Social History  Substance Use Topics  . Smoking status: Former Smoker    Quit date: 12/05/2009  . Smokeless tobacco: None  . Alcohol Use: No    Review of Systems Constitutional: No fever/chills Cardiovascular: Denies chest pain. Respiratory: Denies shortness of breath. Gastrointestinal:   No nausea, no vomiting.   Musculoskeletal: Negative for back pain. Positive for right hand pain. Skin: Positive for erythema right hand. Neurological: Negative for headaches, focal weakness or  numbness.  10-point ROS otherwise negative.  ____________________________________________   PHYSICAL EXAM:  VITAL SIGNS: ED Triage Vitals  Enc Vitals Group     BP 06/02/16 1013 126/73 mmHg     Pulse Rate 06/02/16 1013 84     Resp 06/02/16 1013 20     Temp 06/02/16 1013 97.7 F (36.5 C)     Temp Source 06/02/16 1013 Oral     SpO2 06/02/16 1013 99 %     Weight 06/02/16 1013 128 lb (58.06 kg)     Height 06/02/16 1013 5\' 6"  (1.676 m)     Head Cir --      Peak Flow --      Pain Score 06/02/16 1014 10     Pain Loc --      Pain Edu? --      Excl. in GC? --     Constitutional: Alert and oriented. Well appearing and in no acute distress. Eyes: Conjunctivae are normal. PERRL. EOMI. Head: Atraumatic. Nose: No congestion/rhinnorhea. Neck: No stridor.  Cardiovascular: Normal rate, regular rhythm. Grossly normal heart sounds.  Good peripheral circulation. Respiratory: Normal respiratory effort.  No retractions. Lungs CTAB. Musculoskeletal: Examination of the right hand there is moderate degenerative changes in the digits. Third digit dorsal aspect is moderately erythematous and warm to touch. Erythema also radiates from the PIP joint toward the wrist area. Patient is able to flex and extend her digit with restriction secondary to her arthritis and inflammation in her hand. Motor sensory function intact. Neurologic:  Normal speech and language. No gross focal neurologic deficits are appreciated. No gait instability. Skin:  Skin is warm, dry and intact. No rash noted. Psychiatric: Mood and affect are normal. Speech and behavior are normal.  ____________________________________________   LABS (all labs ordered are listed, but only abnormal results are displayed)  Labs Reviewed  COMPREHENSIVE METABOLIC PANEL - Abnormal; Notable for the following:    ALT 12 (*)    All other components within normal limits  CBC WITH DIFFERENTIAL/PLATELET - Abnormal; Notable for the following:    RDW  15.8 (*)    Platelets 108 (*)    Lymphs Abs 0.7 (*)    All other components within normal limits    RADIOLOGY  Right hand x-ray per radiologist shows bone erosion with osteophyte.  There is extensive. Arthritic soft tissue calcification with some bone erosion involving third PIP joint and joint space narrowing  with spur formation. There are findings compatible with osteoarthritis and gout in multiple joints. I, Johnn Hai, personally viewed and evaluated these images (plain radiographs) as part of my medical decision making, as well as reviewing the written report by the radiologist. ____________________________________________   PROCEDURES  Procedure(s) performed: None  Critical Care performed: No  ____________________________________________   INITIAL IMPRESSION / ASSESSMENT AND PLAN / ED COURSE  Pertinent labs & imaging results that were available during my care of the patient were reviewed by me and considered in my medical decision making (see chart for details).  Area is still worrisome that this is also a overlying cellulitis involving her right hand. Patient was placed on colchicine and clindamycin. We discussed various pain medications however patient states that she lives by herself and prefers not to take narcotics. She states that she'll continue taking Advil as needed for her pain. Patient is follow-up with her primary care doctor if any continued problems or return to the emergency room over the weekend if any severe worsening of her hand. ____________________________________________   FINAL CLINICAL IMPRESSION(S) / ED DIAGNOSES  Final diagnoses:  Cellulitis of middle finger, left  Osteoarthritis of left hand, unspecified osteoarthritis type      NEW MEDICATIONS STARTED DURING THIS VISIT:  Discharge Medication List as of 06/02/2016  1:06 PM    START taking these medications   Details  clindamycin (CLEOCIN) 150 MG capsule Take one tablet qid for 7 days,  Print    colchicine 0.6 MG tablet Take 1 tablet (0.6 mg total) by mouth 2 (two) times daily., Starting 06/02/2016, Until Fri 06/02/17, Print         Note:  This document was prepared using Dragon voice recognition software and may include unintentional dictation errors.    Johnn Hai, PA-C 06/02/16 1511  Earleen Newport, MD 06/02/16 [phone]

## 2016-06-02 NOTE — ED Notes (Signed)
Pt given warm blanket by Solmon Ice, EDT.

## 2016-06-02 NOTE — ED Notes (Signed)
Pt presents with pain and swelling to right hand that radiates up her arm. Pt states that she has taken multiple doses of ibuprofen with no relief. Pt tearful upon triage.

## 2016-06-02 NOTE — Discharge Instructions (Signed)
Cellulitis Cellulitis is an infection of the skin and the tissue under the skin. The infected area is usually red and tender. This happens most often in the arms and lower legs. HOME CARE   Take your antibiotic medicine as told. Finish the medicine even if you start to feel better.  Keep the infected arm or leg raised (elevated).  Put a warm cloth on the area up to 4 times per day.  Only take medicines as told by your doctor.  Keep all doctor visits as told. GET HELP IF:  You see red streaks on the skin coming from the infected area.  Your red area gets bigger or turns a dark color.  Your bone or joint under the infected area is painful after the skin heals.  Your infection comes back in the same area or different area.  You have a puffy (swollen) bump in the infected area.  You have new symptoms.  You have a fever. GET HELP RIGHT AWAY IF:   You feel very sleepy.  You throw up (vomit) or have watery poop (diarrhea).  You feel sick and have muscle aches and pains.   This information is not intended to replace advice given to you by your health care provider. Make sure you discuss any questions you have with your health care provider.   Document Released: 05/09/2008 Document Revised: 08/12/2015 Document Reviewed: 02/06/2012 Elsevier Interactive Patient Education 2016 Elsevier Inc.  Osteoarthritis Osteoarthritis is a disease that causes soreness and inflammation of a joint. It occurs when the cartilage at the affected joint wears down. Cartilage acts as a cushion, covering the ends of bones where they meet to form a joint. Osteoarthritis is the most common form of arthritis. It often occurs in older people. The joints affected most often by this condition include those in the:  Ends of the fingers.  Thumbs.  Neck.  Lower back.  Knees.  Hips. CAUSES  Over time, the cartilage that covers the ends of bones begins to wear away. This causes bone to rub on bone,  producing pain and stiffness in the affected joints.  RISK FACTORS Certain factors can increase your chances of having osteoarthritis, including:  Older age.  Excessive body weight.  Overuse of joints.  Previous joint injury. SIGNS AND SYMPTOMS   Pain, swelling, and stiffness in the joint.  Over time, the joint may lose its normal shape.  Small deposits of bone (osteophytes) may grow on the edges of the joint.  Bits of bone or cartilage can break off and float inside the joint space. This may cause more pain and damage. DIAGNOSIS  Your health care provider will do a physical exam and ask about your symptoms. Various tests may be ordered, such as:  X-rays of the affected joint.  Blood tests to rule out other types of arthritis. Additional tests may be used to diagnose your condition. TREATMENT  Goals of treatment are to control pain and improve joint function. Treatment plans may include:  A prescribed exercise program that allows for rest and joint relief.  A weight control plan.  Pain relief techniques, such as:  Properly applied heat and cold.  Electric pulses delivered to nerve endings under the skin (transcutaneous electrical nerve stimulation [TENS]).  Massage.  Certain nutritional supplements.  Medicines to control pain, such as:  Acetaminophen.  Nonsteroidal anti-inflammatory drugs (NSAIDs), such as naproxen.  Narcotic or central-acting agents, such as tramadol.  Corticosteroids. These can be given orally or as an injection.  Surgery to reposition the bones and relieve pain (osteotomy) or to remove loose pieces of bone and cartilage. Joint replacement may be needed in advanced states of osteoarthritis. HOME CARE INSTRUCTIONS   Take medicines only as directed by your health care provider.  Maintain a healthy weight. Follow your health care provider's instructions for weight control. This may include dietary instructions.  Exercise as directed. Your  health care provider can recommend specific types of exercise. These may include:  Strengthening exercises. These are done to strengthen the muscles that support joints affected by arthritis. They can be performed with weights or with exercise bands to add resistance.  Aerobic activities. These are exercises, such as brisk walking or low-impact aerobics, that get your heart pumping.  Range-of-motion activities. These keep your joints limber.  Balance and agility exercises. These help you maintain daily living skills.  Rest your affected joints as directed by your health care provider.  Keep all follow-up visits as directed by your health care provider. SEEK MEDICAL CARE IF:   Your skin turns red.  You develop a rash in addition to your joint pain.  You have worsening joint pain.  You have a fever along with joint or muscle aches. SEEK IMMEDIATE MEDICAL CARE IF:  You have a significant loss of weight or appetite.  You have night sweats. [place] of Arthritis and Musculoskeletal and Skin Diseases: www.niams.SouthExposed.es  [employer] on Aging: http://kim-Luhman.com/  American College of Rheumatology: www.rheumatology.org   This information is not intended to replace advice given to you by your health care provider. Make sure you discuss any questions you have with your health care provider.   Document Released: 11/21/2005 Document Revised: 12/12/2014 Document Reviewed: 07/29/2013 Elsevier Interactive Patient Education 2016 [place] with your doctor if any continued problems. Begin taking antibiotics and gout medication as directed. Take these medications with food. Return to the emergency room over the weekend if any severe worsening of her symptoms.

## 2016-08-26 ENCOUNTER — Inpatient Hospital Stay: Payer: Medicare Other

## 2016-08-26 ENCOUNTER — Inpatient Hospital Stay: Payer: Medicare Other | Admitting: Hematology and Oncology

## 2016-08-26 DIAGNOSIS — I700 Atherosclerosis of aorta: Secondary | ICD-10-CM | POA: Insufficient documentation

## 2016-09-28 ENCOUNTER — Encounter
Admission: RE | Admit: 2016-09-28 | Discharge: 2016-09-28 | Disposition: A | Discharge status: Home / Self Care | Payer: Medicare Other | Source: Ambulatory Visit | Attending: Orthopedic Surgery | Admitting: Orthopedic Surgery

## 2016-09-28 DIAGNOSIS — R001 Bradycardia, unspecified: Secondary | ICD-10-CM | POA: Diagnosis not present

## 2016-09-28 DIAGNOSIS — Z01810 Encounter for preprocedural cardiovascular examination: Secondary | ICD-10-CM | POA: Diagnosis not present

## 2016-09-28 DIAGNOSIS — Z01818 Encounter for other preprocedural examination: Secondary | ICD-10-CM | POA: Diagnosis present

## 2016-09-28 HISTORY — DX: Other allergy status, other than to drugs and biological substances: Z91.09

## 2016-09-28 HISTORY — DX: Hypothyroidism, unspecified: E03.9

## 2016-09-28 LAB — SEDIMENTATION RATE: Sed Rate: 20 mm/hr (ref 0–30)

## 2016-09-28 LAB — COMPREHENSIVE METABOLIC PANEL
ALT: 16 U/L (ref 14–54)
AST: 21 U/L (ref 15–41)
Albumin: 4.2 g/dL (ref 3.5–5.0)
Alkaline Phosphatase: 62 U/L (ref 38–126)
Anion gap: 7 (ref 5–15)
BUN: 21 mg/dL — ABNORMAL HIGH (ref 6–20)
CHLORIDE: 103 mmol/L (ref 101–111)
CO2: 28 mmol/L (ref 22–32)
CREATININE: 0.77 mg/dL (ref 0.44–1.00)
Calcium: 9.5 mg/dL (ref 8.9–10.3)
Glucose, Bld: 88 mg/dL (ref 65–99)
POTASSIUM: 4.2 mmol/L (ref 3.5–5.1)
SODIUM: 138 mmol/L (ref 135–145)
Total Bilirubin: 0.6 mg/dL (ref 0.3–1.2)
Total Protein: 7.5 g/dL (ref 6.5–8.1)

## 2016-09-28 LAB — PROTIME-INR
INR: 0.97
Prothrombin Time: 12.9 seconds (ref 11.4–15.2)

## 2016-09-28 LAB — TYPE AND SCREEN
ABO/RH(D): O POS
Antibody Screen: NEGATIVE

## 2016-09-28 LAB — SURGICAL PCR SCREEN
MRSA, PCR: NEGATIVE
STAPHYLOCOCCUS AUREUS: NEGATIVE

## 2016-09-28 LAB — APTT: APTT: 38 s — AB (ref 24–36)

## 2016-09-28 LAB — C-REACTIVE PROTEIN: CRP: <0.8 mg/dL (ref <1.0)

## 2016-09-28 NOTE — Patient Instructions (Signed)
  Your procedure is scheduled on: Wednesday Nov. 8, 2017. Report to Same Day Surgery. To find out your arrival time please call [phone] between 1PM - 3PM on Tuesday Nov. 7, 2017.  Remember: Instructions that are not followed completely may result in serious medical risk, up to and including death, or upon the discretion of your surgeon and anesthesiologist your surgery may need to be rescheduled.    _x___ 1. Do not eat food or drink liquids after midnight. No gum chewing or hard candies.     _x___ 2. No Alcohol for 24 hours before or after surgery.   ____ 3. Bring all medications with you on the day of surgery if instructed.    __x__ 4. Notify your doctor if there is any change in your medical condition     (cold, fever, infections).    _____ 5. No smoking 24 hours prior to surgery.     Do not wear jewelry, make-up, hairpins, clips or nail polish.  Do not wear lotions, powders, or perfumes.   Do not shave 48 hours prior to surgery. Men may shave face and neck.  Do not bring valuables to the hospital.    [facility] is not responsible for any belongings or valuables.               Contacts, dentures or bridgework may not be worn into surgery.  Leave your suitcase in the car. After surgery it may be brought to your room.  For patients admitted to the hospital, discharge time is determined by your treatment team.   Patients discharged the day of surgery will not be allowed to drive home.    Please read over the following fact sheets that you were given:   [facility] Preparing for Surgery  _x___ Take these medicines the morning of surgery with A SIP OF WATER:    1.SYNTHROID    ____ Fleet Enema (as directed)   _x___ Use CHG Soap as directed on instruction sheet  ____ Use inhalers on the day of surgery and bring to hospital day of surgery  ____ Stop metformin 2 days prior to surgery    ____ Take 1/2 of usual insulin dose the night before surgery and none on the morning  of surgery.   ____ Stop Coumadin/Plavix/aspirin on 7 days prior to surgery.  _x___ Stop Anti-inflammatories such as Advil, Aleve, Ibuprofen, Motrin, Naproxen, Naprosyn, Goodies powders or aspirin products. OK to take Tylenol.   ____ Stop supplements until after surgery.    ____ Bring C-Pap to the hospital.

## 2016-09-30 NOTE — Pre-Procedure Instructions (Signed)
Urine culture results faxed to Dr. Marry Guan office.

## 2016-10-01 LAB — URINE CULTURE
Culture: >=100000 — AB
SPECIAL REQUESTS: NORMAL

## 2016-10-01 LAB — LATEX, IGE

## 2016-10-04 DIAGNOSIS — M1A00X0 Idiopathic chronic gout, unspecified site, without tophus (tophi): Secondary | ICD-10-CM | POA: Insufficient documentation

## 2016-10-06 ENCOUNTER — Inpatient Hospital Stay: Payer: Medicare Other | Attending: Hematology and Oncology

## 2016-10-06 ENCOUNTER — Inpatient Hospital Stay (HOSPITAL_BASED_OUTPATIENT_CLINIC_OR_DEPARTMENT_OTHER): Payer: Medicare Other | Admitting: Hematology and Oncology

## 2016-10-06 VITALS — BP 114/71 | HR 71 | Temp 98.2°F | Resp 18 | Wt 133.4 lb

## 2016-10-06 DIAGNOSIS — Z853 Personal history of malignant neoplasm of breast: Secondary | ICD-10-CM | POA: Insufficient documentation

## 2016-10-06 DIAGNOSIS — Z79899 Other long term (current) drug therapy: Secondary | ICD-10-CM

## 2016-10-06 DIAGNOSIS — Z87891 Personal history of nicotine dependence: Secondary | ICD-10-CM

## 2016-10-06 DIAGNOSIS — R42 Dizziness and giddiness: Secondary | ICD-10-CM

## 2016-10-06 DIAGNOSIS — C50912 Malignant neoplasm of unspecified site of left female breast: Secondary | ICD-10-CM

## 2016-10-06 DIAGNOSIS — D693 Immune thrombocytopenic purpura: Secondary | ICD-10-CM | POA: Insufficient documentation

## 2016-10-06 DIAGNOSIS — C50511 Malignant neoplasm of lower-outer quadrant of right female breast: Secondary | ICD-10-CM | POA: Insufficient documentation

## 2016-10-06 DIAGNOSIS — N6459 Other signs and symptoms in breast: Secondary | ICD-10-CM | POA: Diagnosis not present

## 2016-10-06 DIAGNOSIS — M810 Age-related osteoporosis without current pathological fracture: Secondary | ICD-10-CM

## 2016-10-06 DIAGNOSIS — Z170 Estrogen receptor positive status [ER+]: Secondary | ICD-10-CM | POA: Diagnosis not present

## 2016-10-06 DIAGNOSIS — C50911 Malignant neoplasm of unspecified site of right female breast: Secondary | ICD-10-CM

## 2016-10-06 DIAGNOSIS — Z803 Family history of malignant neoplasm of breast: Secondary | ICD-10-CM | POA: Diagnosis not present

## 2016-10-06 DIAGNOSIS — E039 Hypothyroidism, unspecified: Secondary | ICD-10-CM | POA: Insufficient documentation

## 2016-10-06 DIAGNOSIS — Z7982 Long term (current) use of aspirin: Secondary | ICD-10-CM | POA: Insufficient documentation

## 2016-10-06 DIAGNOSIS — M19042 Primary osteoarthritis, left hand: Secondary | ICD-10-CM

## 2016-10-06 DIAGNOSIS — K589 Irritable bowel syndrome without diarrhea: Secondary | ICD-10-CM | POA: Diagnosis not present

## 2016-10-06 DIAGNOSIS — M19041 Primary osteoarthritis, right hand: Secondary | ICD-10-CM | POA: Diagnosis not present

## 2016-10-06 DIAGNOSIS — Z923 Personal history of irradiation: Secondary | ICD-10-CM | POA: Insufficient documentation

## 2016-10-06 LAB — COMPREHENSIVE METABOLIC PANEL
ALT: 19 U/L (ref 14–54)
AST: 23 U/L (ref 15–41)
Albumin: 4.2 g/dL (ref 3.5–5.0)
Alkaline Phosphatase: 69 U/L (ref 38–126)
Anion gap: 5 (ref 5–15)
BUN: 15 mg/dL (ref 6–20)
CO2: 29 mmol/L (ref 22–32)
Calcium: 9.6 mg/dL (ref 8.9–10.3)
Chloride: 102 mmol/L (ref 101–111)
Creatinine, Ser: 0.82 mg/dL (ref 0.44–1.00)
GFR calc Af Amer: >60 mL/min (ref >60)
GFR calc non Af Amer: >60 mL/min (ref >60)
Glucose, Bld: 107 mg/dL — ABNORMAL HIGH (ref 65–99)
Potassium: 4.3 mmol/L (ref 3.5–5.1)
Sodium: 136 mmol/L (ref 135–145)
Total Bilirubin: 0.5 mg/dL (ref 0.3–1.2)
Total Protein: 7.2 g/dL (ref 6.5–8.1)

## 2016-10-06 LAB — CBC WITH DIFFERENTIAL/PLATELET
Basophils Absolute: 0.1 10*3/uL (ref 0–0.1)
Basophils Relative: 2 %
Eosinophils Absolute: 0.1 10*3/uL (ref 0–0.7)
Eosinophils Relative: 2 %
HCT: 39.9 % (ref 35.0–47.0)
Hemoglobin: 13.3 g/dL (ref 12.0–16.0)
Lymphocytes Relative: 18 %
Lymphs Abs: 0.8 10*3/uL — ABNORMAL LOW (ref 1.0–3.6)
MCH: 29.5 pg (ref 26.0–34.0)
MCHC: 33.3 g/dL (ref 32.0–36.0)
MCV: 88.5 fL (ref 80.0–100.0)
Monocytes Absolute: 0.3 10*3/uL (ref 0.2–0.9)
Monocytes Relative: 7 %
Neutro Abs: 3.3 10*3/uL (ref 1.4–6.5)
Neutrophils Relative %: 71 %
Platelets: 125 10*3/uL — ABNORMAL LOW (ref 150–440)
RBC: 4.51 MIL/uL (ref 3.80–5.20)
RDW: 15.6 % — ABNORMAL HIGH (ref 11.5–14.5)
WBC: 4.6 10*3/uL (ref 3.6–11.0)

## 2016-10-06 NOTE — Progress Notes (Signed)
Patient has an area on her nose she would like MD to look at today.  Offers no complaints today

## 2016-10-06 NOTE — Progress Notes (Signed)
[facility] day:  10/06/16  Chief Complaint: Aimee Gutierrez is a 82 y.o. female with a history of bilateral breast cancer who is seen for 6 month assessment.  HPI: The patient was last seen in the medical oncology clinic on 04/29/2016.  At that time, she was denied any breast concerns. She described dizzy spells and episodic "fogginess" since the death of her son and husband.  Exam was stable.  She was to follow-up with Dr. Caryl Comes.  During the interim, the symptoms of fogginess went away. She voices no concerns.   Past Medical History:  Diagnosis Date  . Arthritis   . Breast cancer ([place]) 03/09/15   right breast, radiation  . Breast cancer ([place]) 2013   left breast, radiation  . Breast cancer of lower-outer quadrant of right female breast ([place]) 03/09/2015   3.5 mm invasive carcinoma, low-grade DCIS. ER 90%, PR greater than 50%, HER-2/neu not amplified. Axillary sentinel node and radiation deferred based on age..  . Cancer ([place]) 2013   with radiation, stage 1, left breast  . Environmental allergies   . Hypothyroidism   . IBS (irritable bowel syndrome)    in the past, diverticulitis in the past.    Past Surgical History:  Procedure Laterality Date  . BREAST BIOPSY Right 02/17/15   positive  . BREAST SURGERY Left 2013   [place] Right 03/09/15   wide excision  . CATARACT EXTRACTION  2013, 2015  . OVARY SURGERY  2001   benign    Family History  Problem Relation Age of Onset  . Arthritis Mother   . Heart disease Father   . Alzheimer's disease Sister   . Breast cancer Paternal Grandmother     Social History:  reports that she quit smoking about 6 years ago. She has never used smokeless tobacco. She reports that she does not drink alcohol or use drugs.  He son died on 03/21/2016 and her husband died on 04/04/16.  The patient is alone today.  Allergies:  Allergies  Allergen Reactions  . Nitrofurantoin Nausea  Only  . Latex Rash  . Sulfa Antibiotics Rash    Current Medications: Current Outpatient Prescriptions  Medication Sig Dispense Refill  . acetaminophen (TYLENOL) 325 MG tablet Take 650 mg by mouth every 4 (four) hours as needed.    . calcium carbonate (TUMS - DOSED IN MG ELEMENTAL CALCIUM) 500 MG chewable tablet Chew 2 tablets by mouth daily.     . Cholecalciferol (VITAMIN D3) 1000 UNITS CAPS Take 4,000 Units by mouth daily.     Marland Kitchen ibuprofen (ADVIL,MOTRIN) 200 MG tablet Take 200 mg by mouth every 4 (four) hours as needed. Reported on 04/29/2016    . Probiotic Product (HEALTHY COLON PO) Take by mouth at bedtime.     Marland Kitchen SYNTHROID 75 MCG tablet     . aspirin 81 MG tablet Take 81 mg by mouth as needed.     . phenazopyridine (PYRIDIUM) 100 MG tablet Take 100 mg by mouth 3 (three) times daily as needed for pain. Reported on 04/29/2016     No current facility-administered medications for this visit.     Review of Systems:  GENERAL:  Feels "fine". No fevers or sweats. Weight up 6 pounds. PERFORMANCE STATUS (ECOG):  1 HEENT:  No visual changes, runny nose, sore throat, mouth sores or tenderness. Lungs: No shortness of breath or cough.  No hemoptysis. Cardiac:  No chest pain, palpitations,  orthopnea, or PND. GI:  No nausea, vomiting, diarrhea, constipation, melena or hematochezia. GU:  No urgency, frequency, dysuria, or hematuria. Musculoskeletal:  Knee pain.  No back pain.  No muscle tenderness. Extremities:  No arthritis pain in hands.  No swelling. Skin:  No rashes or skin changes. Neuro:  No headache, numbness or weakness, balance or coordination issues. Endocrine:  No diabetes.  Thyroid disease on Synthroid.  No hot flashes or night sweats. Psych:  No mood changes, depression or anxiety. Pain:  No focal pain. Review of systems:  All other systems reviewed and found to be negative.  Physical Exam: Blood pressure 114/71, pulse 71, temperature 98.2 F (36.8 C), temperature source Tympanic,  resp. rate 18, weight 133 lb 6.1 oz (60.5 kg). GENERAL:  Well developed, well nourished, woman sitting comfortably in the exam room in no acute distress.   She is tearful at times. MENTAL STATUS:  Alert and oriented to person, place and time. HEAD:  Short gray hair.  Normocephalic, atraumatic, face symmetric, no Cushingoid features. EYES:  Hazel eyes.  Pupils equal round and reactive to light and accomodation.  No conjunctivitis or scleral icterus. ENT:  Oropharynx clear without lesion.  Tongue normal. Mucous membranes moist.  NECK:  No murmur appreciated. RESPIRATORY:  Clear to auscultation without rales, wheezes or rhonchi. CARDIOVASCULAR:  Regular rate and rhythm without murmur, rub or gallop. BREAST:  Right breast without masses, skin changes or nipple discharge.  Left breast without masses, skin changes or nipple discharge.  Inverted nipples (chronic). ABDOMEN:  Soft, non-tender, with active bowel sounds, and no hepatosplenomegaly.  No masses. SKIN:  Areas of hyperpigmentation around left knee at prior areas of ecchymosis.  No rashes, ulcers or lesions. EXTREMITIES: Arthritis changes in hands.  No edema, no skin discoloration or tenderness.  No palpable cords. LYMPH NODES: No palpable cervical, supraclavicular, axillary or inguinal adenopathy  NEUROLOGICAL: Unremarkable. PSYCH:  Appropriate.   Appointment on 10/06/2016  Component Date Value Ref Range Status  . WBC 10/06/2016 4.6  3.6 - 11.0 K/uL Final  . RBC 10/06/2016 4.51  3.80 - 5.20 MIL/uL Final  . Hemoglobin 10/06/2016 13.3  12.0 - 16.0 g/dL Final  . HCT 10/06/2016 39.9  35.0 - 47.0 % Final  . MCV 10/06/2016 88.5  80.0 - 100.0 fL Final  . MCH 10/06/2016 29.5  26.0 - 34.0 pg Final  . MCHC 10/06/2016 33.3  32.0 - 36.0 g/dL Final  . RDW 10/06/2016 15.6* 11.5 - 14.5 % Final  . Platelets 10/06/2016 125* 150 - 440 K/uL Final  . Neutrophils Relative % 10/06/2016 71  % Final  . Neutro Abs 10/06/2016 3.3  1.4 - 6.5 K/uL Final  .  Lymphocytes Relative 10/06/2016 18  % Final  . Lymphs Abs 10/06/2016 0.8* 1.0 - 3.6 K/uL Final  . Monocytes Relative 10/06/2016 7  % Final  . Monocytes Absolute 10/06/2016 0.3  0.2 - 0.9 K/uL Final  . Eosinophils Relative 10/06/2016 2  % Final  . Eosinophils Absolute 10/06/2016 0.1  0 - 0.7 K/uL Final  . Basophils Relative 10/06/2016 2  % Final  . Basophils Absolute 10/06/2016 0.1  0 - 0.1 K/uL Final  . Sodium 10/06/2016 136  135 - 145 mmol/L Final  . Potassium 10/06/2016 4.3  3.5 - 5.1 mmol/L Final  . Chloride 10/06/2016 102  101 - 111 mmol/L Final  . CO2 10/06/2016 29  22 - 32 mmol/L Final  . Glucose, Bld 10/06/2016 107* 65 - 99 mg/dL Final  .  BUN 10/06/2016 15  6 - 20 mg/dL Final  . Creatinine, Ser 10/06/2016 0.82  0.44 - 1.00 mg/dL Final  . Calcium 10/06/2016 9.6  8.9 - 10.3 mg/dL Final  . Total Protein 10/06/2016 7.2  6.5 - 8.1 g/dL Final  . Albumin 10/06/2016 4.2  3.5 - 5.0 g/dL Final  . AST 10/06/2016 23  15 - 41 U/L Final  . ALT 10/06/2016 19  14 - 54 U/L Final  . Alkaline Phosphatase 10/06/2016 69  38 - 126 U/L Final  . Total Bilirubin 10/06/2016 0.5  0.3 - 1.2 mg/dL Final  . GFR calc non Af Amer 10/06/2016 >60  >60 mL/min Final  . GFR calc Af Amer 10/06/2016 >60  >60 mL/min Final   Comment: (NOTE) The eGFR has been calculated using the CKD EPI equation. This calculation has not been validated in all clinical situations. eGFR's persistently <60 mL/min signify possible Chronic Kidney Disease.   Georgiann Hahn gap 10/06/2016 5  5 - 15 Final    Assessment:  Aimee Gutierrez is a 82 y.o. female with a history of bilateral breast cancer and chronic mild thrombocytopenia..   She initially presented with stage I left breast cancer s/p lumpectomy and sentinel lymph node biopsy on 03/09/2012. Initial biopsy revealed a 7 mm lesion in the left breast. Pathology revealed a 1 mm focus of residual invasive mammry carcinoma with DCIS. Stage T1aN0M0. Tumor was ER/PR+. Her2/neu was not  assessed.  She received radiation from 04/26/2012 to 06/20/2012. She declined hormonal therapy.   Mammogram on 02/13/2014 revealed no evidence of maligancy. Mammogram and ultrasound on 02/16/2015 revealed a 5 x 3 x 2 mm hypoechoic mass at the 4 o'clock position of the right breast. Core biopsy on 02/17/2015 revealed low grade DCIS, cribiform type. Estrogen receptor was positive (> 90%) and progesterone receptor positive (> 90%).   She underwent wide excision of the right breast on 03/09/2015. Pathology revealed a 3.5 mm focus of grade II invasive carcinoma with ductal carcinoma in situ. Margins were negative. No lymph nodes were removed. Tumor was ER positive (> 90%), PR positive (51-90%) and Her2/neu negative. Pathologic stage was T1aNxMx. She completed radiation on 05/13/2015.  She decided against hormonal therapy.  Bilateral mammogram on 02/18/2016 revealed no evidence of malignancy.  CA27.29 was 14.3 on 04/29/2016 and 12.2 on 10/06/2016.  Bone density study on 05/25/2015 revealed osteoporosis (T score of -3.3 in the femur neck and -3.5 in the forearm). She is taking calcium and vitamin D.  She is not interested in Prolia or Fosamax.  She has persistent thrombocytopenia c/w mild chronic ITP.  Platelet count has ranged between 96,000 - 161,000 without trend.  Symptomatically, she describes a dizzy spell and episodic "fogginess" since the death of her son and husband.  Exam is stable.  Platelet count is 125,000.  Plan: 1.  Labs today:  CBC with diff, CMP, CA27.29. 2.  Bilateral mammogram on 02/17/2017. 3.  Continue calcium and vitamin D for osteoporosis.   4.  Continue to monitor platelet count with no intervention. 5.  RTC in 4 months for MD assessment and labs (CBC with diff, CMP, CA27.29).   Lequita Asal, MD  10/06/2016, 11:31 AM

## 2016-10-07 LAB — CANCER ANTIGEN 27.29: CA 27.29: 12.2 U/mL (ref 0.0–38.6)

## 2016-10-08 ENCOUNTER — Encounter: Payer: Self-pay | Admitting: Hematology and Oncology

## 2016-10-10 ENCOUNTER — Other Ambulatory Visit: Payer: Self-pay | Admitting: *Deleted

## 2016-10-10 DIAGNOSIS — C50911 Malignant neoplasm of unspecified site of right female breast: Secondary | ICD-10-CM

## 2016-10-12 ENCOUNTER — Encounter: Payer: Self-pay | Admitting: Orthopedic Surgery

## 2016-10-12 ENCOUNTER — Inpatient Hospital Stay: Payer: Medicare Other | Admitting: Anesthesiology

## 2016-10-12 ENCOUNTER — Encounter
Admission: RE | Disposition: A | Discharge status: Home Health | Payer: Self-pay | Source: Ambulatory Visit | Attending: Orthopedic Surgery

## 2016-10-12 ENCOUNTER — Inpatient Hospital Stay: Payer: Medicare Other

## 2016-10-12 ENCOUNTER — Inpatient Hospital Stay
Admission: RE | Admit: 2016-10-12 | Discharge: 2016-10-14 | DRG: 470 | Disposition: A | Discharge status: Home Health | Payer: Medicare Other | Source: Ambulatory Visit | Attending: Orthopedic Surgery | Admitting: Orthopedic Surgery

## 2016-10-12 DIAGNOSIS — M25661 Stiffness of right knee, not elsewhere classified: Secondary | ICD-10-CM

## 2016-10-12 DIAGNOSIS — Z853 Personal history of malignant neoplasm of breast: Secondary | ICD-10-CM

## 2016-10-12 DIAGNOSIS — Z87891 Personal history of nicotine dependence: Secondary | ICD-10-CM | POA: Diagnosis not present

## 2016-10-12 DIAGNOSIS — Z923 Personal history of irradiation: Secondary | ICD-10-CM | POA: Diagnosis not present

## 2016-10-12 DIAGNOSIS — E039 Hypothyroidism, unspecified: Secondary | ICD-10-CM | POA: Diagnosis present

## 2016-10-12 DIAGNOSIS — M6281 Muscle weakness (generalized): Secondary | ICD-10-CM

## 2016-10-12 DIAGNOSIS — Z803 Family history of malignant neoplasm of breast: Secondary | ICD-10-CM

## 2016-10-12 DIAGNOSIS — M1711 Unilateral primary osteoarthritis, right knee: Principal | ICD-10-CM | POA: Diagnosis present

## 2016-10-12 DIAGNOSIS — Z96659 Presence of unspecified artificial knee joint: Secondary | ICD-10-CM

## 2016-10-12 HISTORY — PX: TOTAL KNEE ARTHROPLASTY: SHX125

## 2016-10-12 LAB — CBC
HEMATOCRIT: 41.4 % (ref 35.0–47.0)
HEMOGLOBIN: 13.8 g/dL (ref 12.0–16.0)
MCH: 29.5 pg (ref 26.0–34.0)
MCHC: 33.4 g/dL (ref 32.0–36.0)
MCV: 88.3 fL (ref 80.0–100.0)
Platelets: 111 10*3/uL — ABNORMAL LOW (ref 150–440)
RBC: 4.68 MIL/uL (ref 3.80–5.20)
RDW: 15.8 % — ABNORMAL HIGH (ref 11.5–14.5)
WBC: 4.1 10*3/uL (ref 3.6–11.0)

## 2016-10-12 LAB — URINALYSIS COMPLETE WITH MICROSCOPIC (ARMC ONLY)
BACTERIA UA: NONE SEEN
Bilirubin Urine: NEGATIVE
Glucose, UA: NEGATIVE mg/dL
HGB URINE DIPSTICK: NEGATIVE
Ketones, ur: NEGATIVE mg/dL
NITRITE: NEGATIVE
PROTEIN: NEGATIVE mg/dL
SPECIFIC GRAVITY, URINE: 1.016 (ref 1.005–1.030)
pH: 6 (ref 5.0–8.0)

## 2016-10-12 LAB — ABO/RH: ABO/RH(D): O POS

## 2016-10-12 SURGERY — ARTHROPLASTY, KNEE, TOTAL
Anesthesia: Spinal | Site: Knee | Laterality: Right | Wound class: Clean

## 2016-10-12 MED ORDER — TRANEXAMIC ACID 1000 MG/10ML IV SOLN
1000.0000 mg | INTRAVENOUS | Status: DC
  Filled 2016-10-12: qty 10

## 2016-10-12 MED ORDER — PROPOFOL 10 MG/ML IV BOLUS
INTRAVENOUS | Status: DC | PRN
  Administered 2016-10-12: 12 mg via INTRAVENOUS
  Administered 2016-10-12: 20 mg via INTRAVENOUS

## 2016-10-12 MED ORDER — NEOMYCIN-POLYMYXIN B GU 40-200000 IR SOLN
Status: DC | PRN
  Administered 2016-10-12: 14 mL

## 2016-10-12 MED ORDER — FLEET ENEMA 7-19 GM/118ML RE ENEM: 1.0000 | ENEMA | Freq: Once | RECTAL | Status: DC | PRN

## 2016-10-12 MED ORDER — CEFAZOLIN SODIUM-DEXTROSE 2-4 GM/100ML-% IV SOLN
INTRAVENOUS | Status: AC
  Filled 2016-10-12: qty 100

## 2016-10-12 MED ORDER — ONDANSETRON HCL 4 MG/2ML IJ SOLN: 4.0000 mg | Freq: Four times a day (QID) | INTRAMUSCULAR | Status: DC | PRN

## 2016-10-12 MED ORDER — MENTHOL 3 MG MT LOZG
1.0000 | LOZENGE | OROMUCOSAL | Status: DC | PRN
  Filled 2016-10-12: qty 9

## 2016-10-12 MED ORDER — ACETAMINOPHEN 10 MG/ML IV SOLN
1000.0000 mg | Freq: Four times a day (QID) | INTRAVENOUS | Status: AC
  Administered 2016-10-12 – 2016-10-13 (×4): 1000 mg via INTRAVENOUS
  Filled 2016-10-12 (×4): qty 100

## 2016-10-12 MED ORDER — BUPIVACAINE HCL (PF) 0.25 % IJ SOLN
INTRAMUSCULAR | Status: DC | PRN
  Administered 2016-10-12: 30 mL

## 2016-10-12 MED ORDER — DIPHENHYDRAMINE HCL 12.5 MG/5ML PO ELIX: 12.5000 mg | ORAL_SOLUTION | ORAL | Status: DC | PRN

## 2016-10-12 MED ORDER — RISAQUAD PO CAPS
1.0000 | ORAL_CAPSULE | Freq: Every day | ORAL | Status: DC
  Administered 2016-10-12 – 2016-10-13 (×2): 1 via ORAL
  Filled 2016-10-12 (×2): qty 1

## 2016-10-12 MED ORDER — TRANEXAMIC ACID 1000 MG/10ML IV SOLN
1000.0000 mg | Freq: Once | INTRAVENOUS | Status: AC
  Administered 2016-10-12: 1000 mg via INTRAVENOUS
  Filled 2016-10-12: qty 10

## 2016-10-12 MED ORDER — CALCIUM CARBONATE ANTACID 500 MG PO CHEW
2.0000 | CHEWABLE_TABLET | Freq: Every day | ORAL | Status: DC
  Administered 2016-10-13: 400 mg via ORAL
  Filled 2016-10-12: qty 2

## 2016-10-12 MED ORDER — PANTOPRAZOLE SODIUM 40 MG PO TBEC
40.0000 mg | DELAYED_RELEASE_TABLET | Freq: Two times a day (BID) | ORAL | Status: DC
  Administered 2016-10-12 – 2016-10-14 (×4): 40 mg via ORAL
  Filled 2016-10-12 (×4): qty 1

## 2016-10-12 MED ORDER — FENTANYL CITRATE (PF) 100 MCG/2ML IJ SOLN
25.0000 ug | INTRAMUSCULAR | Status: DC | PRN
  Administered 2016-10-12 (×4): 25 ug via INTRAVENOUS

## 2016-10-12 MED ORDER — OXYCODONE HCL 5 MG PO TABS
5.0000 mg | ORAL_TABLET | ORAL | Status: DC | PRN
  Filled 2016-10-12: qty 1
  Filled 2016-10-12: qty 2

## 2016-10-12 MED ORDER — FENTANYL CITRATE (PF) 100 MCG/2ML IJ SOLN
INTRAMUSCULAR | Status: DC | PRN
  Administered 2016-10-12: 100 ug via INTRAVENOUS

## 2016-10-12 MED ORDER — DEXAMETHASONE SODIUM PHOSPHATE 4 MG/ML IJ SOLN
INTRAMUSCULAR | Status: DC | PRN
  Administered 2016-10-12: 10 mg via INTRAVENOUS

## 2016-10-12 MED ORDER — SODIUM CHLORIDE 0.9 % IV SOLN
INTRAVENOUS | Status: DC | PRN
  Administered 2016-10-12: 10 ug/kg/min via INTRAVENOUS

## 2016-10-12 MED ORDER — PHENAZOPYRIDINE HCL 100 MG PO TABS
100.0000 mg | ORAL_TABLET | Freq: Three times a day (TID) | ORAL | Status: DC | PRN
  Filled 2016-10-12: qty 1

## 2016-10-12 MED ORDER — NEOMYCIN-POLYMYXIN B GU 40-200000 IR SOLN
Status: AC
  Filled 2016-10-12: qty 20

## 2016-10-12 MED ORDER — ACETAMINOPHEN 10 MG/ML IV SOLN
INTRAVENOUS | Status: DC | PRN
  Administered 2016-10-12: 1000 mg via INTRAVENOUS

## 2016-10-12 MED ORDER — BISACODYL 10 MG RE SUPP
10.0000 mg | Freq: Every day | RECTAL | Status: DC | PRN
  Administered 2016-10-14: 10 mg via RECTAL
  Filled 2016-10-12: qty 1

## 2016-10-12 MED ORDER — FAMOTIDINE 20 MG PO TABS
20.0000 mg | ORAL_TABLET | Freq: Once | ORAL | Status: DC
  Administered 2016-10-12: 20 mg via ORAL

## 2016-10-12 MED ORDER — LACTATED RINGERS IV SOLN
INTRAVENOUS | Status: DC | PRN
  Administered 2016-10-12 (×2): via INTRAVENOUS

## 2016-10-12 MED ORDER — FENTANYL CITRATE (PF) 100 MCG/2ML IJ SOLN
INTRAMUSCULAR | Status: AC
  Administered 2016-10-12: 25 ug via INTRAVENOUS
  Filled 2016-10-12: qty 2

## 2016-10-12 MED ORDER — MAGNESIUM HYDROXIDE 400 MG/5ML PO SUSP
30.0000 mL | Freq: Every day | ORAL | Status: DC | PRN
  Administered 2016-10-13: 30 mL via ORAL
  Filled 2016-10-12: qty 30

## 2016-10-12 MED ORDER — CEFAZOLIN SODIUM-DEXTROSE 2-4 GM/100ML-% IV SOLN
2.0000 g | Freq: Four times a day (QID) | INTRAVENOUS | Status: AC
  Administered 2016-10-12 – 2016-10-13 (×4): 2 g via INTRAVENOUS
  Filled 2016-10-12 (×4): qty 100

## 2016-10-12 MED ORDER — ONDANSETRON HCL 4 MG/2ML IJ SOLN: 4.0000 mg | Freq: Once | INTRAMUSCULAR | Status: DC | PRN

## 2016-10-12 MED ORDER — SODIUM CHLORIDE 0.9 % IV SOLN
INTRAVENOUS | Status: DC | PRN
  Administered 2016-10-12: 60 mL

## 2016-10-12 MED ORDER — ESMOLOL HCL 100 MG/10ML IV SOLN
INTRAVENOUS | Status: DC | PRN
  Administered 2016-10-12 (×3): 20 mg via INTRAVENOUS

## 2016-10-12 MED ORDER — BUPIVACAINE LIPOSOME 1.3 % IJ SUSP
INTRAMUSCULAR | Status: AC
  Filled 2016-10-12: qty 20

## 2016-10-12 MED ORDER — GLYCOPYRROLATE 0.2 MG/ML IJ SOLN
INTRAMUSCULAR | Status: DC | PRN
  Administered 2016-10-12: 0.2 mg via INTRAVENOUS

## 2016-10-12 MED ORDER — PROPOFOL 500 MG/50ML IV EMUL
INTRAVENOUS | Status: DC | PRN
  Administered 2016-10-12: 100 ug/kg/min via INTRAVENOUS

## 2016-10-12 MED ORDER — ENOXAPARIN SODIUM 30 MG/0.3ML ~~LOC~~ SOLN
30.0000 mg | Freq: Two times a day (BID) | SUBCUTANEOUS | Status: DC
  Administered 2016-10-13 – 2016-10-14 (×3): 30 mg via SUBCUTANEOUS
  Filled 2016-10-12 (×3): qty 0.3

## 2016-10-12 MED ORDER — PHENOL 1.4 % MT LIQD
1.0000 | OROMUCOSAL | Status: DC | PRN
  Filled 2016-10-12: qty 177

## 2016-10-12 MED ORDER — KETAMINE HCL 50 MG/ML IJ SOLN
INTRAMUSCULAR | Status: DC | PRN
  Administered 2016-10-12: 1.2 mg via INTRAMUSCULAR

## 2016-10-12 MED ORDER — ALUM & MAG HYDROXIDE-SIMETH 200-200-20 MG/5ML PO SUSP: 30.0000 mL | ORAL | Status: DC | PRN

## 2016-10-12 MED ORDER — VITAMIN D 1000 UNITS PO TABS
4000.0000 [IU] | ORAL_TABLET | Freq: Every day | ORAL | Status: DC
  Administered 2016-10-12 – 2016-10-14 (×3): 4000 [IU] via ORAL
  Filled 2016-10-12 (×3): qty 4

## 2016-10-12 MED ORDER — CEFAZOLIN SODIUM-DEXTROSE 2-3 GM-% IV SOLR
INTRAVENOUS | Status: DC | PRN
  Administered 2016-10-12: 2 g via INTRAVENOUS

## 2016-10-12 MED ORDER — CHLORHEXIDINE GLUCONATE 4 % EX LIQD
60.0000 mL | Freq: Once | CUTANEOUS | Status: DC
  Administered 2016-10-12: 4 via TOPICAL

## 2016-10-12 MED ORDER — LACTATED RINGERS IV SOLN
INTRAVENOUS | Status: DC
  Administered 2016-10-12: 11:00:00 via INTRAVENOUS

## 2016-10-12 MED ORDER — ONDANSETRON HCL 4 MG PO TABS: 4.0000 mg | ORAL_TABLET | Freq: Four times a day (QID) | ORAL | Status: DC | PRN

## 2016-10-12 MED ORDER — ACETAMINOPHEN 10 MG/ML IV SOLN
INTRAVENOUS | Status: AC
  Filled 2016-10-12: qty 100

## 2016-10-12 MED ORDER — BUPIVACAINE HCL (PF) 0.25 % IJ SOLN
INTRAMUSCULAR | Status: AC
  Filled 2016-10-12: qty 60

## 2016-10-12 MED ORDER — MORPHINE SULFATE (PF) 2 MG/ML IV SOLN: 2.0000 mg | INTRAVENOUS | Status: DC | PRN

## 2016-10-12 MED ORDER — CEFAZOLIN SODIUM-DEXTROSE 2-4 GM/100ML-% IV SOLN: 2.0000 g | INTRAVENOUS | Status: DC

## 2016-10-12 MED ORDER — FERROUS SULFATE 325 (65 FE) MG PO TABS
325.0000 mg | ORAL_TABLET | Freq: Two times a day (BID) | ORAL | Status: DC
  Administered 2016-10-13 – 2016-10-14 (×3): 325 mg via ORAL
  Filled 2016-10-12 (×3): qty 1

## 2016-10-12 MED ORDER — TRAMADOL HCL 50 MG PO TABS
50.0000 mg | ORAL_TABLET | ORAL | Status: DC | PRN
  Administered 2016-10-12 (×3): 50 mg via ORAL
  Administered 2016-10-13 (×2): 100 mg via ORAL
  Administered 2016-10-13: 50 mg via ORAL
  Administered 2016-10-13: 100 mg via ORAL
  Administered 2016-10-13: 50 mg via ORAL
  Administered 2016-10-13 – 2016-10-14 (×4): 100 mg via ORAL
  Filled 2016-10-12: qty 2
  Filled 2016-10-12: qty 1
  Filled 2016-10-12: qty 2
  Filled 2016-10-12 (×3): qty 1
  Filled 2016-10-12 (×5): qty 2
  Filled 2016-10-12 (×2): qty 1

## 2016-10-12 MED ORDER — SODIUM CHLORIDE FLUSH 0.9 % IV SOLN
INTRAVENOUS | Status: AC
  Filled 2016-10-12: qty 3

## 2016-10-12 MED ORDER — LEVOTHYROXINE SODIUM 75 MCG PO TABS
75.0000 ug | ORAL_TABLET | Freq: Every day | ORAL | Status: DC
  Administered 2016-10-13 – 2016-10-14 (×2): 75 ug via ORAL
  Filled 2016-10-12 (×2): qty 1

## 2016-10-12 MED ORDER — FAMOTIDINE 20 MG PO TABS
ORAL_TABLET | ORAL | Status: AC
  Administered 2016-10-12: 20 mg via ORAL
  Filled 2016-10-12: qty 1

## 2016-10-12 MED ORDER — SENNOSIDES-DOCUSATE SODIUM 8.6-50 MG PO TABS
1.0000 | ORAL_TABLET | Freq: Two times a day (BID) | ORAL | Status: DC
  Administered 2016-10-12 – 2016-10-14 (×4): 1 via ORAL
  Filled 2016-10-12 (×4): qty 1

## 2016-10-12 MED ORDER — ACETAMINOPHEN 325 MG PO TABS
650.0000 mg | ORAL_TABLET | Freq: Four times a day (QID) | ORAL | Status: DC | PRN
  Administered 2016-10-14: 650 mg via ORAL
  Filled 2016-10-12: qty 2

## 2016-10-12 MED ORDER — METOCLOPRAMIDE HCL 10 MG PO TABS
10.0000 mg | ORAL_TABLET | Freq: Three times a day (TID) | ORAL | Status: DC
  Administered 2016-10-12 – 2016-10-14 (×6): 10 mg via ORAL
  Filled 2016-10-12 (×6): qty 1

## 2016-10-12 MED ORDER — SODIUM CHLORIDE 0.9 % IV SOLN
INTRAVENOUS | Status: DC | PRN
  Administered 2016-10-12: 25 ug/min via INTRAVENOUS

## 2016-10-12 MED ORDER — CEFAZOLIN SODIUM-DEXTROSE 2-3 GM-% IV SOLR: INTRAVENOUS | Status: DC | PRN

## 2016-10-12 MED ORDER — ACETAMINOPHEN 650 MG RE SUPP: 650.0000 mg | Freq: Four times a day (QID) | RECTAL | Status: DC | PRN

## 2016-10-12 MED ORDER — SODIUM CHLORIDE 0.9 % IV SOLN
INTRAVENOUS | Status: DC
  Administered 2016-10-12 – 2016-10-13 (×2): via INTRAVENOUS

## 2016-10-12 SURGICAL SUPPLY — 58 items
AUTOTRANSFUS HAS 1/8 (MISCELLANEOUS) ×3
BATTERY INSTRU NAVIGATION (MISCELLANEOUS) ×12 IMPLANT
BLADE SAW 1 (BLADE) ×3 IMPLANT
BLADE SAW 1/2 (BLADE) ×3 IMPLANT
CANISTER SUCT 1200ML W/VALVE (MISCELLANEOUS) ×3 IMPLANT
CANISTER SUCT 3000ML (MISCELLANEOUS) ×6 IMPLANT
CAPT KNEE TOTAL 3 ATTUNE ×3 IMPLANT
CATH TRAY METER 16FR LF (MISCELLANEOUS) ×3 IMPLANT
CEMENT HV SMART SET (Cement) ×6 IMPLANT
COOLER POLAR GLACIER W/PUMP (MISCELLANEOUS) ×3 IMPLANT
CUFF TOURN 24 STER (MISCELLANEOUS) IMPLANT
CUFF TOURN 30 STER DUAL PORT (MISCELLANEOUS) ×3 IMPLANT
DRAPE SHEET LG 3/4 BI-LAMINATE (DRAPES) ×3 IMPLANT
DRSG DERMACEA 8X12 NADH (GAUZE/BANDAGES/DRESSINGS) ×3 IMPLANT
DRSG OPSITE POSTOP 4X14 (GAUZE/BANDAGES/DRESSINGS) ×3 IMPLANT
DRSG TEGADERM 4X4.75 (GAUZE/BANDAGES/DRESSINGS) ×3 IMPLANT
DURAPREP 26ML APPLICATOR (WOUND CARE) ×6 IMPLANT
ELECT CAUTERY BLADE 6.4 (BLADE) ×3 IMPLANT
ELECT REM PT RETURN 9FT ADLT (ELECTROSURGICAL) ×3
ELECTRODE REM PT RTRN 9FT ADLT (ELECTROSURGICAL) ×1 IMPLANT
EX-PIN ORTHOLOCK NAV 4X150 (PIN) ×6 IMPLANT
GLOVE BIOGEL M STRL SZ7.5 (GLOVE) ×6 IMPLANT
GLOVE INDICATOR 8.0 STRL GRN (GLOVE) ×3 IMPLANT
GLOVE SURG 9.0 ORTHO LTXF (GLOVE) ×3 IMPLANT
GLOVE SURG ORTHO 9.0 STRL STRW (GLOVE) ×3 IMPLANT
GOWN STRL REUS W/ TWL LRG LVL3 (GOWN DISPOSABLE) ×2 IMPLANT
GOWN STRL REUS W/TWL 2XL LVL3 (GOWN DISPOSABLE) ×3 IMPLANT
GOWN STRL REUS W/TWL LRG LVL3 (GOWN DISPOSABLE) ×4
HANDPIECE INTERPULSE COAX TIP (DISPOSABLE) ×2
HOLDER FOLEY CATH W/STRAP (MISCELLANEOUS) ×3 IMPLANT
HOOD PEEL AWAY FLYTE STAYCOOL (MISCELLANEOUS) ×6 IMPLANT
KIT RM TURNOVER STRD PROC AR (KITS) ×3 IMPLANT
KNIFE SCULPS 14X20 (INSTRUMENTS) ×3 IMPLANT
LABEL OR SOLS (LABEL) ×3 IMPLANT
NDL SAFETY 18GX1.5 (NEEDLE) ×3 IMPLANT
NEEDLE SPNL 20GX3.5 QUINCKE YW (NEEDLE) ×3 IMPLANT
NS IRRIG 500ML POUR BTL (IV SOLUTION) ×3 IMPLANT
PACK TOTAL KNEE (MISCELLANEOUS) ×3 IMPLANT
PAD WRAPON POLAR KNEE (MISCELLANEOUS) ×1 IMPLANT
PIN FIXATION 1/8DIA X 3INL (PIN) ×3 IMPLANT
SET HNDPC FAN SPRY TIP SCT (DISPOSABLE) ×1 IMPLANT
SOL .9 NS 3000ML IRR  AL (IV SOLUTION) ×2
SOL .9 NS 3000ML IRR UROMATIC (IV SOLUTION) ×1 IMPLANT
SOL PREP PVP 2OZ (MISCELLANEOUS) ×3
SOLUTION PREP PVP 2OZ (MISCELLANEOUS) ×1 IMPLANT
SPONGE DRAIN TRACH 4X4 STRL 2S (GAUZE/BANDAGES/DRESSINGS) ×3 IMPLANT
STAPLER SKIN PROX 35W (STAPLE) ×3 IMPLANT
SUCTION FRAZIER HANDLE 10FR (MISCELLANEOUS) ×2
SUCTION TUBE FRAZIER 10FR DISP (MISCELLANEOUS) ×1 IMPLANT
SUT VIC AB 0 CT1 36 (SUTURE) ×3 IMPLANT
SUT VIC AB 1 CT1 36 (SUTURE) ×6 IMPLANT
SUT VIC AB 2-0 CT2 27 (SUTURE) ×3 IMPLANT
SYR 20CC LL (SYRINGE) ×3 IMPLANT
SYR 30ML LL (SYRINGE) ×6 IMPLANT
SYSTEM AUTOTRANSFUS DUAL TROCR (MISCELLANEOUS) ×1 IMPLANT
TOWEL OR 17X26 4PK STRL BLUE (TOWEL DISPOSABLE) ×3 IMPLANT
TOWER CARTRIDGE SMART MIX (DISPOSABLE) ×3 IMPLANT
WRAPON POLAR PAD KNEE (MISCELLANEOUS) ×3

## 2016-10-12 NOTE — Brief Op Note (Signed)
10/12/2016  3:14 PM  PATIENT:  Neil Crouch  82 y.o. female  PRE-OPERATIVE DIAGNOSIS:  primary osteoarthritis of the right knee  POST-OPERATIVE DIAGNOSIS:  Same  PROCEDURE:  Procedure(s): TOTAL KNEE ARTHROPLASTY (Right)  SURGEON:  Surgeon(s) and Role:    * Dereck Leep, MD - Primary  ASSISTANTS: Vance Peper, PA   ANESTHESIA:   spinal  EBL:  Total I/O In: 1450 [I.V.:1450] Out: 250 [Urine:200; Blood:50]  BLOOD ADMINISTERED:none  DRAINS: 2 medium drains to a reinfusion system   LOCAL MEDICATIONS USED:  MARCAINE    and OTHER Exparel  SPECIMEN:  No Specimen  DISPOSITION OF SPECIMEN:  N/A  COUNTS:  YES  TOURNIQUET:    DICTATION: .Dragon Dictation  PLAN OF CARE: Admit to inpatient   PATIENT DISPOSITION:  PACU - hemodynamically stable.   Delay start of Pharmacological VTE agent (>24hrs) due to surgical blood loss or risk of bleeding: yes

## 2016-10-12 NOTE — Transfer of Care (Signed)
Immediate Anesthesia Transfer of Care Note  Patient: Aimee Gutierrez  Procedure(s) Performed: Procedure(s): TOTAL KNEE ARTHROPLASTY (Right)  Patient Location: PACU  Anesthesia Type:Spinal  Level of Consciousness: awake and alert   Airway & Oxygen Therapy: Patient Spontanous Breathing and Patient connected to nasal cannula oxygen  Post-op Assessment: Report given to RN and Post -op Vital signs reviewed and stable  Post vital signs: Reviewed  Last Vitals:  Vitals:   10/12/16 0951 10/12/16 1513  BP: (!) 144/75 (!) 89/56  Pulse: 71 83  Resp: 14 15  Temp: 36.6 C     Last Pain:  Vitals:   10/12/16 0951  TempSrc: Tympanic  PainSc: 5          Complications: No apparent anesthesia complications

## 2016-10-12 NOTE — Anesthesia Procedure Notes (Signed)
Spinal

## 2016-10-12 NOTE — Op Note (Signed)
OPERATIVE NOTE  DATE OF SURGERY:  10/12/2016  PATIENT NAME:  VALERIA GRICE   DOB: 1933/02/04  MRN: [id number]  PRE-OPERATIVE DIAGNOSIS: Degenerative arthrosis of the right knee, primary  POST-OPERATIVE DIAGNOSIS:  Same  PROCEDURE:  Right total knee arthroplasty using computer-assisted navigation  SURGEON:  Marciano Sequin. M.D.  ASSISTANT:  Vance Peper, PA (present and scrubbed throughout the case, critical for assistance with exposure, retraction, instrumentation, and closure)  ANESTHESIA: spinal  ESTIMATED BLOOD LOSS: 50 mL  FLUIDS REPLACED: 1400 mL of crystalloid  TOURNIQUET TIME: 88 minutes  DRAINS: 2 medium drains to a reinfusion system  SOFT TISSUE RELEASES: Anterior cruciate ligament, posterior cruciate ligament, deep medial collateral ligament, patellofemoral ligament  IMPLANTS UTILIZED: DePuy Attune size 5 posterior stabilized femoral component (cemented), size 7 rotating platform tibial component (cemented), 35 mm medialized dome patella (cemented), and a 7 mm stabilized rotating platform polyethylene insert.  INDICATIONS FOR SURGERY: BEV LAWTER is a 82 y.o. year old female with a long history of progressive knee pain. X-rays demonstrated severe degenerative changes in tricompartmental fashion. The patient had not seen any significant improvement despite conservative nonsurgical intervention. After discussion of the risks and benefits of surgical intervention, the patient expressed understanding of the risks benefits and agree with plans for total knee arthroplasty.   The risks, benefits, and alternatives were discussed at length including but not limited to the risks of infection, bleeding, nerve injury, stiffness, blood clots, the need for revision surgery, cardiopulmonary complications, among others, and they were willing to proceed.  PROCEDURE IN DETAIL: The patient was brought into the operating room and, after adequate spinal anesthesia was achieved, a tourniquet  was placed on the patient's upper thigh. The patient's knee and leg were cleaned and prepped with alcohol and DuraPrep and draped in the usual sterile fashion. A "timeout" was performed as per usual protocol. The lower extremity was exsanguinated using an Esmarch, and the tourniquet was inflated to 300 mmHg. An anterior longitudinal incision was made followed by a standard mid vastus approach. The deep fibers of the medial collateral ligament were elevated in a subperiosteal fashion off of the medial flare of the tibia so as to maintain a continuous soft tissue sleeve. The patella was subluxed laterally and the patellofemoral ligament was incised. Inspection of the knee demonstrated severe degenerative changes with full-thickness loss of articular cartilage. Osteophytes were debrided using a rongeur. Anterior and posterior cruciate ligaments were excised. Two 4.0 mm Schanz pins were inserted in the femur and into the tibia for attachment of the array of trackers used for computer-assisted navigation. Hip center was identified using a circumduction technique. Distal landmarks were mapped using the computer. The distal femur and proximal tibia were mapped using the computer. The distal femoral cutting guide was positioned using computer-assisted navigation so as to achieve a 5 distal valgus cut. The femur was sized and it was felt that a size 5 femoral component was appropriate. A size 5 femoral cutting guide was positioned and the anterior cut was performed and verified using the computer. This was followed by completion of the posterior and chamfer cuts. Femoral cutting guide for the central box was then positioned in the center box cut was performed.  Attention was then directed to the proximal tibia. Medial and lateral menisci were excised. The extramedullary tibial cutting guide was positioned using computer-assisted navigation so as to achieve a 0 varus-valgus alignment and 3 posterior slope. The cut was  performed and verified using the computer.  The proximal tibia was sized and it was felt that a size 7 tibial tray was appropriate. Tibial and femoral trials were inserted followed by insertion of a 7 mm polyethylene insert. This allowed for excellent mediolateral soft tissue balancing both in flexion and in full extension. Finally, the patella was cut and prepared so as to accommodate a 35 mm medialized dome patella. A patella trial was placed and the knee was placed through a range of motion with excellent patellar tracking appreciated. The femoral trial was removed after debridement of posterior osteophytes. The central post-hole for the tibial component was reamed followed by insertion of a keel punch. Tibial trials were then removed. Cut surfaces of bone were irrigated with copious amounts of normal saline with antibiotic solution using pulsatile lavage and then suctioned dry. Polymethylmethacrylate cement was prepared in the usual fashion using a vacuum mixer. Cement was applied to the cut surface of the proximal tibia as well as along the undersurface of a size 7 rotating platform tibial component. Tibial component was positioned and impacted into place. Excess cement was removed using [employer] fast streamer. Cement was then applied to the cut surfaces of the femur as well as along the posterior flanges of the size 5 femoral component. The femoral component was positioned and impacted into place. Excess cement was removed using [employer] fast streamer. A 7 mm polyethylene trial was inserted and the knee was brought into full extension with steady axial compression applied. Finally, cement was applied to the backside of a 35 mm medialized dome patella and the patellar component was positioned and patellar clamp applied. Excess cement was removed using [employer] fast streamer. After adequate curing of the cement, the tourniquet was deflated after a total tourniquet time of 88 minutes. Hemostasis was achieved using electrocautery. The  knee was irrigated with copious amounts of normal saline with antibiotic solution using pulsatile lavage and then suctioned dry. 20 mL of 1.3% Exparel and 60 mL of 0.25% Marcaine in 40 mL of normal saline was injected along the posterior capsule, medial and lateral gutters, and along the arthrotomy site. A 7 mm stabilized rotating platform polyethylene insert was inserted and the knee was placed through a range of motion with excellent mediolateral soft tissue balancing appreciated and excellent patellar tracking noted. 2 medium drains were placed in the wound bed and brought out through separate stab incisions to be attached to a reinfusion system. The medial parapatellar portion of the incision was reapproximated using interrupted sutures of #1 Vicryl. Subcutaneous tissue was approximated in layers using first #0 Vicryl followed #2-0 Vicryl. The skin was approximated with skin staples. A sterile dressing was applied.  The patient tolerated the procedure well and was transported to the recovery room in stable condition.    Skyah Hannon P. Holley Bouche., M.D.

## 2016-10-12 NOTE — Anesthesia Preprocedure Evaluation (Addendum)
Anesthesia Evaluation  Patient identified by MRN, date of birth, ID band Patient awake    Reviewed: Allergy & Precautions, NPO status , Patient's Chart, lab work & pertinent test results, reviewed documented beta blocker date and time   Airway Mallampati: II  TM Distance: >3 FB     Dental  (+) Chipped   Pulmonary former smoker,           Cardiovascular      Neuro/Psych    GI/Hepatic   Endo/Other  Hypothyroidism   Renal/GU      Musculoskeletal  (+) Arthritis ,   Abdominal   Peds  Hematology   Anesthesia Other Findings IBS. EKG reviewed and ok. Will use non-latex gloves.  Reproductive/Obstetrics                            Anesthesia Physical Anesthesia Plan  ASA: III  Anesthesia Plan: Spinal   Post-op Pain Management:    Induction:   Airway Management Planned:   Additional Equipment:   Intra-op Plan:   Post-operative Plan:   Informed Consent: I have reviewed the patients History and Physical, chart, labs and discussed the procedure including the risks, benefits and alternatives for the proposed anesthesia with the patient or authorized representative who has indicated his/her understanding and acceptance.     Plan Discussed with: CRNA  Anesthesia Plan Comments:         Anesthesia Quick Evaluation

## 2016-10-12 NOTE — H&P (Signed)
The patient has been re-examined, and the chart reviewed, and there have been no interval changes to the documented history and physical.    The risks, benefits, and alternatives have been discussed at length. The patient expressed understanding of the risks benefits and agreed with plans for surgical intervention.  James P. Hooten, Jr. M.D.    

## 2016-10-13 ENCOUNTER — Encounter: Payer: Self-pay | Admitting: Orthopedic Surgery

## 2016-10-13 LAB — BASIC METABOLIC PANEL
Anion gap: 5 (ref 5–15)
BUN: 11 mg/dL (ref 6–20)
CHLORIDE: 104 mmol/L (ref 101–111)
CO2: 27 mmol/L (ref 22–32)
Calcium: 8.8 mg/dL — ABNORMAL LOW (ref 8.9–10.3)
Creatinine, Ser: 0.71 mg/dL (ref 0.44–1.00)
GFR calc Af Amer: >60 mL/min (ref >60)
GFR calc non Af Amer: >60 mL/min (ref >60)
GLUCOSE: 123 mg/dL — AB (ref 65–99)
POTASSIUM: 4.3 mmol/L (ref 3.5–5.1)
SODIUM: 136 mmol/L (ref 135–145)

## 2016-10-13 LAB — CBC
HEMATOCRIT: 31.2 % — AB (ref 35.0–47.0)
HEMOGLOBIN: 10.6 g/dL — AB (ref 12.0–16.0)
MCH: 29.8 pg (ref 26.0–34.0)
MCHC: 33.9 g/dL (ref 32.0–36.0)
MCV: 87.9 fL (ref 80.0–100.0)
Platelets: 100 10*3/uL — ABNORMAL LOW (ref 150–440)
RBC: 3.55 MIL/uL — AB (ref 3.80–5.20)
RDW: 15.4 % — ABNORMAL HIGH (ref 11.5–14.5)
WBC: 7.5 10*3/uL (ref 3.6–11.0)

## 2016-10-13 MED ORDER — TRANEXAMIC ACID 1000 MG/10ML IV SOLN
INTRAVENOUS | Status: DC | PRN
  Administered 2016-10-12: 1000 mg via INTRAVENOUS

## 2016-10-13 NOTE — Progress Notes (Signed)
Physical Therapy Treatment Patient Details Name: Aimee Gutierrez MRN: [id number] DOB: 10-25-1933 Today's Date: 10/13/2016    History of Present Illness Pt. is an 82 y.o. female who was admitted to [facility] for a right TKR.    PT Comments    Pt demonstrates excellent progress with therapy this afternoon. She is able to walk a quarter lap around RN station, back, and then an additional length of the RN station essentially completing a full lap. Pt denies DOE and no signs of SOB. Pt able to complete all seated and supine exercises without increase in pain. Good RLE strength noted with progressing R knee flexion AROM. Able to perform full LAQ without assistance. Tomorrow morning will attempt to complete a full lap around RN station and perform stair training. Pt will benefit from skilled PT services to address deficits in strength, balance, and mobility in order to return to full function at home.    Follow Up Recommendations  Home health PT     Equipment Recommendations  Rolling walker with 5" wheels    Recommendations for Other Services       Precautions / Restrictions Precautions Precautions: Fall;Knee Precaution Booklet Issued: Yes (comment) Required Braces or Orthoses: Knee Immobilizer - Right Knee Immobilizer - Right: Discontinue once straight leg raise with < 10 degree lag Restrictions Weight Bearing Restrictions: Yes RLE Weight Bearing: Weight bearing as tolerated    Mobility  Bed Mobility Overal bed mobility: Needs Assistance Bed Mobility: Sit to Supine       Sit to supine: Supervision   General bed mobility comments: Pt demonstrates good RLE hip flexion strength when returning to bed. Good sequencing. Pt does not require external assist  Transfers Overall transfer level: Needs assistance Equipment used: Rolling walker (2 wheeled) Transfers: Sit to/from Stand Sit to Stand: Min guard         General transfer comment: Pt demonstrates improved speed and sequencing this  afternoon. Stable with all transfers  Ambulation/Gait Ambulation/Gait assistance: Min guard Ambulation Distance (Feet): 180 Feet Assistive device: Rolling walker (2 wheeled) Gait Pattern/deviations: Step-through pattern Gait velocity: Decreased Gait velocity interpretation: <1.8 ft/sec, indicative of risk for recurrent falls General Gait Details: Pt ambulates 1/4 of the way around the RN station and then ambulates another length of the RN station essentially completing a full lap. Pt provided cues for step length and is able to progress to full reciprocal pattern. Good upright posture with minimal UE support on walker noted. Pt able to let go of the walker intermittently with good stability   Stairs            Wheelchair Mobility    Modified Rankin (Stroke Patients Only)       Balance Overall balance assessment: Needs assistance Sitting-balance support: No upper extremity supported Sitting balance-Leahy Scale: Good     Standing balance support: No upper extremity supported Standing balance-Leahy Scale: Fair                      Cognition Arousal/Alertness: Awake/alert Behavior During Therapy: WFL for tasks assessed/performed Overall Cognitive Status: Within Functional Limits for tasks assessed                      Exercises Total Joint Exercises Hip ABduction/ADduction: Strengthening;Right;10 reps;Seated Long Arc Quad: Strengthening;Right;10 reps;Seated Knee Flexion: Strengthening;Right;10 reps;Seated Marching in Standing: Strengthening;Both;10 reps;Seated    General Comments        Pertinent Vitals/Pain Pain Assessment: 0-10 Pain Score: 1  Pain Location: Right knee Pain Descriptors / Indicators: Aching Pain Intervention(s): Limited activity within patient's tolerance;Monitored during session    Home Living Family/patient expects to be discharged to:: Private residence Living Arrangements: Alone Available Help at Discharge: Family Type  of Home: House Home Access: Ramped entrance Entrance Stairs-Rails: None Home Layout: Two level;Able to live on main level with bedroom/bathroom Home Equipment: Shower seat;Shower seat - built in;Walker - 4 wheels;Cane - single point;Bedside commode;Grab bars - tub/shower;Grab bars - toilet;Other (comment)      Prior Function Level of Independence: Independent with assistive device(s)          PT Goals (current goals can now be found in the care plan section) Acute Rehab PT Goals Patient Stated Goal: Improve her function at home PT Goal Formulation: With patient/family Time For Goal Achievement: 10/27/16 Potential to Achieve Goals: Good Progress towards PT goals: Progressing toward goals    Frequency    BID      PT Plan Current plan remains appropriate    Co-evaluation             End of Session Equipment Utilized During Treatment: Gait belt Activity Tolerance: Patient tolerated treatment well Patient left: with call bell/phone within reach;with family/visitor present;Other (comment);in bed;with bed alarm set;with SCD's reapplied (polar care and bone foam in place)     Time: [id number] PT Time Calculation (min) (ACUTE ONLY): 33 min  Charges:  $Gait Training: 8-22 mins $Therapeutic Exercise: 8-22 mins                    G Codes:      Lyndel Safe Alessandro Griep PT, DPT   Macil Crady 10/13/2016, 3:00 PM

## 2016-10-13 NOTE — Evaluation (Signed)
Physical Therapy Evaluation Patient Details Name: Aimee Gutierrez MRN: [id number] DOB: 1933-06-01 Today's Date: 10/13/2016   History of Present Illness  Pt underwent R TKR without reported post-op complications. PT evaluation performed on POD#1.  Clinical Impression  Pt admitted with above diagnosis. Pt currently with functional limitations due to the deficits listed below (see PT Problem List).  Pt does very well with her physical therapy evaluation. She demonstrates good strength with bed mobility, transfers, and ambulation. Pt only requires CGA for all mobility. She is able to ambulate from bed to door and back to recliner. VSS and pt denies DOE during ambulation. Pt is able to complete all supine exercises as instructed with adequate strength to perform full SLR and SAQ without assistance. Pt will be appropriate to return home with [facility] PT at discharge. She will need a rolling walker as she only has a rollator at home. Pt will benefit from skilled PT services to address deficits in strength, balance, and mobility in order to return to full function at home.     Follow Up Recommendations Home health PT    Equipment Recommendations  Rolling walker with 5" wheels    Recommendations for Other Services       Precautions / Restrictions Precautions Precautions: Fall;Knee Precaution Booklet Issued: Yes (comment) Required Braces or Orthoses: Knee Immobilizer - Right Knee Immobilizer - Right: Discontinue once straight leg raise with < 10 degree lag Restrictions Weight Bearing Restrictions: Yes RLE Weight Bearing: Weight bearing as tolerated      Mobility  Bed Mobility Overal bed mobility: Needs Assistance Bed Mobility: Supine to Sit     Supine to sit: Min guard     General bed mobility comments: Pt demonstrates good RLE strength moving from supine to sitting at EOB. HOB mildly elevated and bed rails utilized but otherwise no external support needed. Pt requires increased time to perform  but reasonable for POD#1  Transfers Overall transfer level: Needs assistance Equipment used: Rolling walker (2 wheeled) Transfers: Sit to/from Stand Sit to Stand: Min guard         General transfer comment: Cues for safe hand placement and sequencing during transfer. Pt demonstrates good weight shifting to RLE once upright in standing. Mild increase in time required but overall fair speed during transfer  Ambulation/Gait Ambulation/Gait assistance: Min guard Ambulation Distance (Feet): 40 Feet Assistive device: Rolling walker (2 wheeled) Gait Pattern/deviations: Step-to pattern Gait velocity: Decreased Gait velocity interpretation: <1.8 ft/sec, indicative of risk for recurrent falls General Gait Details: Pt ambulates from bed to door and back to recliner with therapist. Cues provided for proper sequencing with walker. Pt demonstrates good weight shifting to RLE during ambulation and is able to progress to partial step-through pattern with cues from therapist. Cues for sequencing with turns. Good safety and stability noted during ambulation. Pt denies increase in pain or DOE. VSS throughout ambulation  Stairs            Wheelchair Mobility    Modified Rankin (Stroke Patients Only)       Balance Overall balance assessment: Needs assistance Sitting-balance support: No upper extremity supported Sitting balance-Leahy Scale: Good     Standing balance support: No upper extremity supported Standing balance-Leahy Scale: Fair                               Pertinent Vitals/Pain Pain Assessment: 0-10 Pain Score: 3  Pain Location: R posterior knee Pain Descriptors /  Indicators: Dull Pain Intervention(s): Monitored during session;Premedicated before session    Home Living Family/patient expects to be discharged to:: Private residence Living Arrangements: Alone Available Help at Discharge: Family;Other (Comment) (Daughter will stay with patient at  discharge) Type of Home: House Home Access: Ramped entrance Entrance Stairs-Rails: None Entrance Stairs-Number of Steps: 1 (Landing to enter house) Home Layout: One level Home Equipment: Shower seat;Shower seat - built in;Walker - 4 wheels;Cane - single point;Bedside commode;Grab bars - tub/shower;Grab bars - toilet;Other (comment) (Bed rail)      Prior Function Level of Independence: Independent with assistive device(s)         Comments: Intermittent use of rollator and single point cane. Full community ambulator. 1 fall in the last 12 months. Independent with ADLs. Assist required for grocery shopping intermittently     Hand Dominance   Dominant Hand: Right    Extremity/Trunk Assessment   Upper Extremity Assessment: Overall WFL for tasks assessed           Lower Extremity Assessment: RLE deficits/detail RLE Deficits / Details: Pt able to perform SLR and SAQ without assistance. Full DF/PF and fully intact sensation to light touch       Communication   Communication: No difficulties  Cognition Arousal/Alertness: Awake/alert Behavior During Therapy: WFL for tasks assessed/performed Overall Cognitive Status: Within Functional Limits for tasks assessed                      General Comments      Exercises Total Joint Exercises Ankle Circles/Pumps: Strengthening;Both;10 reps;Supine Quad Sets: Strengthening;Both;10 reps;Supine Gluteal Sets: Strengthening;Both;10 reps;Supine Towel Squeeze: Strengthening;Both;10 reps;Supine Short Arc Quad: Strengthening;Right;10 reps;Supine Heel Slides: Strengthening;Right;10 reps;Supine Hip ABduction/ADduction: Strengthening;Right;10 reps;Supine Straight Leg Raises: Strengthening;Right;10 reps;Supine Goniometric ROM: -4 to 90 degrees AAROM, pain limited   Assessment/Plan    PT Assessment Patient needs continued PT services  PT Problem List Decreased strength;Decreased range of motion;Decreased balance;Decreased  mobility;Decreased knowledge of use of DME;Pain          PT Treatment Interventions DME instruction;Gait training;Stair training;Therapeutic activities;Therapeutic exercise;Balance training;Neuromuscular re-education;Patient/family education;Manual techniques    PT Goals (Current goals can be found in the Care Plan section)  Acute Rehab PT Goals Patient Stated Goal: Improve her function at home PT Goal Formulation: With patient/family Time For Goal Achievement: 10/27/16 Potential to Achieve Goals: Good    Frequency BID   Barriers to discharge        Co-evaluation               End of Session Equipment Utilized During Treatment: Gait belt Activity Tolerance: Patient tolerated treatment well Patient left: in chair;with call bell/phone within reach;with family/visitor present;Other (comment) (With OT in room, will set-up patient after session) Nurse Communication: Mobility status         Time: [id number] PT Time Calculation (min) (ACUTE ONLY): 40 min   Charges:   PT Evaluation $PT Eval Low Complexity: 1 Procedure PT Treatments $Therapeutic Exercise: 8-22 mins   PT G Codes:       Lyndel Safe Huprich PT, DPT   Huprich,Jason 10/13/2016, 10:37 AM

## 2016-10-13 NOTE — Progress Notes (Signed)
Clinical Social Worker (CSW) received SNF consult. PT is recommending home health. RN case manager is aware of above. Please reconsult if future social work needs arise. CSW signing off.   Rashunda Passon, LCSW [phone] 

## 2016-10-13 NOTE — Progress Notes (Signed)
Patient is A&O x4. Up with assist x1 and WW. Drain and dressing in place. Urinated without difficultly. No BM this shift. CMTS, WNL. Using  PO Tramadol for pain, with good relief. Bed alarm on for safety. Uses call light appropriately. NSL. Good appetite. Daughters at bedside most of the day.

## 2016-10-13 NOTE — Addendum Note (Signed)
Addendum  created 10/13/16 [id number] by Doreen Salvage, CRNA   Anesthesia Event deleted, Anesthesia Event edited

## 2016-10-13 NOTE — Care Management Note (Signed)
Case Management Note  Patient Details  Name: Aimee Gutierrez MRN: [id number] Date of Birth: [birth year]  Subjective/Objective:   POD #1 s/p right TKA. Met with patient and her daughter at beside. She is alert and oriented and ususally lives alone. She has a daughter who will be living with her through Dec. 8 and another daughter that lives close by and is willing to help. Offered choice of home health agencies and patient prefers [facility]. She will need a walker. Ordered from [facility]. She uses [facility]: [phone]. Called Lovenox 40 mg # 14, no refills.              Action/Plan: [facility] for home PT. [facility] for walker. Lovenox called in.   Expected Discharge Date:  10/15/16               Expected Discharge Plan:  [place]  In-House Referral:     Discharge planning Services  CM Consult  Post Acute Care Choice:  Durable Medical Equipment, Home Health Choice offered to:  Patient, Adult Children  DME Arranged:  [facility] DME Agency:  [place] Arranged:  PT [place]:  [facility] (now [facility])  Status of Service:  In process, will continue to follow  If discussed at Long Length of Stay Meetings, dates discussed:    Additional Comments:  Jolly Mango, RN 10/13/2016, 3:27 PM

## 2016-10-13 NOTE — Addendum Note (Signed)
Addendum  created 10/13/16 0948 by Bernardo Heater, CRNA   Anesthesia Intra Meds edited

## 2016-10-13 NOTE — Progress Notes (Signed)
   Subjective: 1 Day Post-Op Procedure(s) (LRB): TOTAL KNEE ARTHROPLASTY (Right) Patient reports pain as 4 on 0-10 scale.   Patient is well, and has had no acute complaints or problems We will start therapy today.  Plan is to go Home after hospital stay. no nausea and no vomiting Patient denies any chest pains or shortness of breath. Patient states that she rested well during the night and had no pain until roughly 6:30 this morning.  Objective: Vital signs in last 24 hours: Temp:  [97.5 F (36.4 C)-98.4 F (36.9 C)] 97.5 F (36.4 C) (11/09 0430) Pulse Rate:  [61-87] 65 (11/09 0430) Resp:  [10-19] 18 (11/09 0430) BP: (89-144)/(56-75) 113/63 (11/09 0430) SpO2:  [98 %-100 %] 100 % (11/09 0430) FiO2 (%):  [28 %] 28 % (11/08 1650) Weight:  [61.2 kg (135 lb)-65.3 kg (143 lb 14.4 oz)] 65.3 kg (143 lb 14.4 oz) (11/08 1651) Heels are non tender and elevated off the bed using rolled towels along with bone foam to the surgical leg. Compression foot pumps in place and working well. Intake/Output from previous day: 11/08 0701 - 11/09 0700 In: 3155 [I.V.:2955] Out: 9702 [Urine:1000; Drains:170; Blood:50] Intake/Output this shift: No intake/output data recorded.   Recent Labs  10/12/16 0938 10/13/16 0430  HGB 13.8 10.6*    Recent Labs  10/12/16 0938 10/13/16 0430  WBC 4.1 7.5  RBC 4.68 3.55*  HCT 41.4 31.2*  PLT 111* 100*    Recent Labs  10/13/16 0430  NA 136  K 4.3  CL 104  CO2 27  BUN 11  CREATININE 0.71  GLUCOSE 123*  CALCIUM 8.8*   No results for input(s): LABPT, INR in the last 72 hours.  EXAM General - Patient is Alert, Appropriate and Oriented Extremity - Neurologically intact Neurovascular intact Sensation intact distally Intact pulses distally Dorsiflexion/Plantar flexion intact No cellulitis present Compartment soft Dressing - dressing C/D/I Motor Function - intact, moving foot and toes well on exam. Patient able to do straight leg raise on her  own.  Past Medical History:  Diagnosis Date  . Arthritis   . Breast cancer ([place]) 03/09/15   right breast, radiation  . Breast cancer ([place]) 2013   left breast, radiation  . 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 ER 90%, PR greater than 50%, HER-2/neu not amplified. Axillary sentinel node and radiation deferred based on age..  . Cancer ([place]) 2013   with radiation, stage 1, left breast  . Environmental allergies   . Hypothyroidism   . IBS (irritable bowel syndrome)    in the past, diverticulitis in the past.    Assessment/Plan: 1 Day Post-Op Procedure(s) (LRB): TOTAL KNEE ARTHROPLASTY (Right) Active Problems:   S/P total knee arthroplasty  Estimated body mass index is 23.23 kg/m as calculated from the following:   Height as of this encounter: '5\' 6"'$  (1.676 m).   Weight as of this encounter: 65.3 kg (143 lb 14.4 oz). Advance diet Up with therapy D/C IV fluids Plan for discharge tomorrow Discharge home with home health  Labs: Were reviewed. DVT Prophylaxis - Lovenox, Foot Pumps and TED hose Weight-Bearing as tolerated to right leg D/C O2 and Pulse OX and try on Room Air Begin working on bowel movement Labs tomorrow morning.  Jillyn Ledger. [place] 10/13/2016, 7:32 AM

## 2016-10-13 NOTE — Anesthesia Postprocedure Evaluation (Signed)
Anesthesia Post Note  Patient: AERICA Gutierrez  Procedure(s) Performed: Procedure(s) (LRB): TOTAL KNEE ARTHROPLASTY (Right)  Patient location during evaluation: Nursing Unit Anesthesia Type: Spinal Level of consciousness: awake, awake and alert and oriented Pain management: pain level controlled Vital Signs Assessment: post-procedure vital signs reviewed and stable Respiratory status: spontaneous breathing, nonlabored ventilation and respiratory function stable Cardiovascular status: blood pressure returned to baseline and stable Postop Assessment: no headache and no backache Anesthetic complications: no    Last Vitals:  Vitals:   10/13/16 0430 10/13/16 0829  BP: 113/63 113/65  Pulse: 65 61  Resp: 18 18  Temp: 36.4 C 36.4 C    Last Pain:  Vitals:   10/13/16 0829  TempSrc: Oral  PainSc:                  Johnna Acosta

## 2016-10-13 NOTE — Progress Notes (Signed)
Patient was admitted to room 157 from surgery via room bed. Dressing intact with Autovac in place. Foley in place. A&O x4. Daughters at bedside. Oriented to room, call light, TV and bed controls. Review POC. Bed alarm on for safety. No c/o pain.

## 2016-10-13 NOTE — Discharge Summary (Signed)
Physician Discharge Summary  Patient ID: Aimee Gutierrez MRN: [id number] DOB/AGE: 03/01/1933 82 y.o.  Admit date: 10/12/2016 Discharge date: 10/14/2016  Admission Diagnoses:  primary osteoarthritis   Discharge Diagnoses: Patient Active Problem List   Diagnosis Date Noted  . S/P total knee arthroplasty 10/12/2016  . Chronic ITP (idiopathic thrombocytopenic purpura) (HCC) 10/06/2016  . Carcinoma in situ, breast, ductal 08/03/2015  . Acid indigestion 07/02/2015  . Adult hypothyroidism 07/02/2015  . Arthritis, degenerative 07/02/2015  . Change in blood platelet count 07/02/2015  . Osteoporosis 06/04/2015  . Invasive ductal carcinoma of right breast, stage 1 ([place]) 03/13/2015  . Invasive ductal carcinoma of left breast, stage 1 ([place]) 02/26/2015    Past Medical History:  Diagnosis Date  . Arthritis   . Breast cancer ([place]) 03/09/15   right breast, radiation  . Breast cancer ([place]) 2013   left breast, radiation  . Breast cancer of lower-outer quadrant of right female breast ([place]) 03/09/2015   3.5 mm invasive carcinoma, low-grade DCIS. ER 90%, PR greater than 50%, HER-2/neu not amplified. Axillary sentinel node and radiation deferred based on age..  . Cancer ([place]) 2013   with radiation, stage 1, left breast  . Environmental allergies   . Hypothyroidism   . IBS (irritable bowel syndrome)    in the past, diverticulitis in the past.     Transfusion: No transfusions given doing this admission   Consultants (if any):  none  Discharged Condition: Improved  Hospital Course: Aimee Gutierrez is an 82 y.o. female who was admitted 10/12/2016 with a diagnosis of degenerative arthrosis Right knee and went to the operating room on 10/12/2016 and underwent the above named procedures.    Surgeries:Procedure(s): TOTAL KNEE ARTHROPLASTY on 10/12/2016 PRE-OPERATIVE DIAGNOSIS: Degenerative arthrosis of the right knee, primary  POST-OPERATIVE DIAGNOSIS:  Same  PROCEDURE:  Right total knee  arthroplasty using computer-assisted navigation  SURGEON:  Marciano Sequin. M.D.  ASSISTANT:  Vance Peper, PA (present and scrubbed throughout the case, critical for assistance with exposure, retraction, instrumentation, and closure)  ANESTHESIA: spinal  ESTIMATED BLOOD LOSS: 50 mL  FLUIDS REPLACED: 1400 mL of crystalloid  TOURNIQUET TIME: 88 minutes  DRAINS: 2 medium drains to a reinfusion system  SOFT TISSUE RELEASES: Anterior cruciate ligament, posterior cruciate ligament, deep medial collateral ligament, patellofemoral ligament  IMPLANTS UTILIZED: DePuy Attune size 5 posterior stabilized femoral component (cemented), size 7 rotating platform tibial component (cemented), 35 mm medialized dome patella (cemented), and a 7 mm stabilized rotating platform polyethylene insert.  INDICATIONS FOR SURGERY: Aimee Gutierrez is a 82 y.o. year old female with a long history of progressive knee pain. X-rays demonstrated severe degenerative changes in tricompartmental fashion. The patient had not seen any significant improvement despite conservative nonsurgical intervention. After discussion of the risks and benefits of surgical intervention, the patient expressed understanding of the risks benefits and agree with plans for total knee arthroplasty.   The risks, benefits, and alternatives were discussed at length including but not limited to the risks of infection, bleeding, nerve injury, stiffness, blood clots, the need for revision surgery, cardiopulmonary complications, among others, and they were willing to proceed.    Patient started on Lovenox 30 mg q 12 hrs. Foot pumps applied bilaterally at 80 mm hg. Heels elevated off bed with rolled towels. No evidence of DVT. Calves non tender. Negative [place]. Physical therapy started on day #1 for gait training and transfer with OT starting on  day #1 for ADL and assisted devices. Patient has  done well with therapy. Ambulated greater than 200 was  able to go up and down 4 steps safely and independently feet upon being discharged.  Patient's IV and Foley were discontinued on day #1 with Hemovac being discontinued on day #2. Dressing also changed on day #2 prior to being discharged   She was given perioperative antibiotics:  Anti-infectives    Start     Dose/Rate Route Frequency Ordered Stop   10/12/16 1800  ceFAZolin (ANCEF) IVPB 2g/100 mL premix     2 g 200 mL/hr over 30 Minutes Intravenous Every 6 hours 10/12/16 1649 10/13/16 1759   10/12/16 0643  ceFAZolin (ANCEF) 2-4 GM/100ML-% IVPB    Comments:  STOLLEY, LORI: cabinet override      10/12/16 0643 10/12/16 1844   10/12/16 0336  ceFAZolin (ANCEF) IVPB 2g/100 mL premix  Status:  Discontinued     2 g 200 mL/hr over 30 Minutes Intravenous On call to O.R. 10/12/16 3149 10/12/16 0726    .  She was fitted with AV 1 compression foot pump devices,instructed on heel pumps, early ambulation, and TED stockings bilaterally for DVT prophylaxis.  She benefited maximally from the hospital stay and there were no complications.    Recent vital signs:  Vitals:   10/12/16 2337 10/13/16 0430  BP: 118/65 113/63  Pulse: 67 65  Resp: 18 18  Temp: 97.6 F (36.4 C) 97.5 F (36.4 C)    Recent laboratory studies:  Lab Results  Component Value Date   HGB 10.6 (L) 10/13/2016   HGB 13.8 10/12/2016   HGB 13.3 10/06/2016   Lab Results  Component Value Date   WBC 7.5 10/13/2016   PLT 100 (L) 10/13/2016   Lab Results  Component Value Date   INR 0.97 09/28/2016   Lab Results  Component Value Date   NA 136 10/13/2016   K 4.3 10/13/2016   CL 104 10/13/2016   CO2 27 10/13/2016   BUN 11 10/13/2016   CREATININE 0.71 10/13/2016   GLUCOSE 123 (H) 10/13/2016    Discharge Medications:     Medication List    TAKE these medications   calcium carbonate 750 MG chewable tablet Commonly known as:  TUMS EX Chew 2 tablets by mouth daily.   enoxaparin 40 MG/0.4ML injection Commonly known  as:  LOVENOX Inject 0.4 mLs (40 mg total) into the skin daily.   HEALTHY COLON PO Take 1 capsule by mouth at bedtime.   oxyCODONE 5 MG immediate release tablet Commonly known as:  Oxy IR/ROXICODONE Take 1-2 tablets (5-10 mg total) by mouth every 4 (four) hours as needed for severe pain or breakthrough pain.   phenazopyridine 100 MG tablet Commonly known as:  PYRIDIUM Take 100 mg by mouth 3 (three) times daily as needed for pain. Reported on 04/29/2016   SYNTHROID 75 MCG tablet Generic drug:  levothyroxine Take 75 mcg by mouth daily before breakfast.   traMADol 50 MG tablet Commonly known as:  ULTRAM Take 1-2 tablets (50-100 mg total) by mouth every 4 (four) hours as needed for moderate pain.   Vitamin D3 1000 units Caps Take 4,000 Units by mouth daily.            Durable Medical Equipment        Start     Ordered   10/12/16 1650  DME Walker rolling  Once     10/12/16 1649   10/12/16 1650  DME Bedside commode  Once     10/12/16 1649  Diagnostic Studies: Dg Knee Right Port  Result Date: 10/12/2016 CLINICAL DATA:  Total knee replacement EXAM: PORTABLE RIGHT KNEE - 1-2 VIEW COMPARISON:  None. FINDINGS: Postop knee replacement in satisfactory position and alignment. No fracture or immediate complication. IMPRESSION: Satisfactory postop right knee replacement. Electronically Signed   By: Franchot Gallo M.D.   On: 10/12/2016 15:49    Disposition: 01-Home or Self Care    Follow-up Information    WOLFE,JON R., PA On 10/25/2016.   Specialty:  Physician Assistant Why:  at 1:15pm Contact information: [place] [address] [phone]        Dereck Leep, MD On 11/22/2016.   Specialty:  Orthopedic Surgery Why:  at 9:00am Contact information: [place] [address] [phone]            Signed: Watt Climes 10/13/2016, 7:42 AM

## 2016-10-13 NOTE — Evaluation (Signed)
Occupational Therapy Evaluation Patient Details Name: Aimee Gutierrez MRN: [id number] DOB: 01/12/1933 Today's Date: 10/13/2016    History of Present Illness Pt. is an 82 y.o. female who was admitted to [facility] for a right TKR.   Clinical Impression    Pt. Is an 82 y.o. female who was admitted for a right TKR. Pt presents with limited ROM, pain, weakness, and impaired functional mobility which hinder her ability to complete ADL and IADL tasks. Pt. could benefit from skilled OT services to review A/E use for LE ADLs, to review necessary home modifications, and to improve functional mobility for ADL/IADLs in order to work towards regaining Independence with ADL/IADLs.        Follow Up Recommendations  No OT follow up    Equipment Recommendations       Recommendations for Other Services       Precautions / Restrictions Precautions Precautions: Fall;Knee Precaution Booklet Issued: Yes (comment) Required Braces or Orthoses: Knee Immobilizer - Right Knee Immobilizer - Right: Discontinue once straight leg raise with < 10 degree lag Restrictions Weight Bearing Restrictions: Yes RLE Weight Bearing: Weight bearing as tolerated              ADL Overall ADL's : Needs assistance/impaired Eating/Feeding: Set up   Grooming: Set up               Lower Body Dressing: Minimal assistance   Toilet Transfer: Min guard   Toileting- Clothing Manipulation and Hygiene: Supervision/safety       Functional mobility during ADLs: Minimal assistance General ADL Comments: Pt. education was provided about A/E use for LE ADLs.     Vision     Perception     Praxis      Pertinent Vitals/Pain Pain Assessment: 0-10 Pain Score: 4  Pain Location: RIght Knee Pain Descriptors / Indicators: Dull Pain Intervention(s): Repositioned;Monitored during session     Hand Dominance Right   Extremity/Trunk Assessment Upper Extremity Assessment Upper Extremity Assessment: Overall WFL for  tasks assessed         Communication Communication Communication: No difficulties   Cognition Arousal/Alertness: Awake/alert Behavior During Therapy: WFL for tasks assessed/performed Overall Cognitive Status: Within Functional Limits for tasks assessed                     General Comments       Exercises   Shoulder Instructions      Home Living Family/patient expects to be discharged to:: Private residence Living Arrangements: Alone Available Help at Discharge: Family Type of Home: House Home Access: Ramped entrance Entrance Stairs-Number of Steps: 1 (Landing to enter house) Entrance Stairs-Rails: None Home Layout: Two level;Able to live on main level with bedroom/bathroom     Bathroom Shower/Tub: Walk-in shower;Door   [employer] Toilet: Standard     Home Equipment: Civil engineer, contracting;Shower seat - built in;Walker - 4 wheels;Cane - single point;Bedside commode;Grab bars - tub/shower;Grab bars - toilet;Other (comment)          Prior Functioning/Environment Level of Independence: Independent with assistive device(s)        Comments: Intermittent use of rollator and single point cane. Full community ambulator. 1 fall in the last 12 months. Independent with ADLs. Assist required for grocery shopping intermittently        OT Problem List: Decreased strength;Decreased activity tolerance;Pain;Impaired UE functional use;Decreased knowledge of use of DME or AE   OT Treatment/Interventions: Self-care/ADL training;Therapeutic exercise;DME and/or AE instruction;Patient/family education;Therapeutic activities    OT  Goals(Current goals can be found in the care plan section) Acute Rehab OT Goals Patient Stated Goal: Improve her function at home OT Goal Formulation: With patient Potential to Achieve Goals: Good  OT Frequency: Min 1X/week   Barriers to D/C:            Co-evaluation              End of Session    Activity Tolerance: Patient tolerated treatment  well Patient left: in chair;with chair alarm set;with call bell/phone within reach   Time: [id number] OT Time Calculation (min): 55 min Charges:  OT General Charges $OT Visit: 1 Procedure OT Evaluation $OT Eval Moderate Complexity: 1 Procedure OT Treatments $Self Care/Home Management : 23-37 mins G-Codes:    Harrel Carina, MS, OTR/L 10/13/2016, 11:33 AM

## 2016-10-13 NOTE — Discharge Instructions (Signed)

## 2016-10-14 LAB — BASIC METABOLIC PANEL
Anion gap: 3 — ABNORMAL LOW (ref 5–15)
BUN: 13 mg/dL (ref 6–20)
CALCIUM: 8.6 mg/dL — AB (ref 8.9–10.3)
CHLORIDE: 104 mmol/L (ref 101–111)
CO2: 31 mmol/L (ref 22–32)
CREATININE: 0.84 mg/dL (ref 0.44–1.00)
GFR calc Af Amer: >60 mL/min (ref >60)
GFR calc non Af Amer: >60 mL/min (ref >60)
GLUCOSE: 89 mg/dL (ref 65–99)
Potassium: 3.9 mmol/L (ref 3.5–5.1)
Sodium: 138 mmol/L (ref 135–145)

## 2016-10-14 LAB — CBC
HEMATOCRIT: 27.5 % — AB (ref 35.0–47.0)
HEMOGLOBIN: 9.3 g/dL — AB (ref 12.0–16.0)
MCH: 30 pg (ref 26.0–34.0)
MCHC: 34 g/dL (ref 32.0–36.0)
MCV: 88.3 fL (ref 80.0–100.0)
Platelets: 85 10*3/uL — ABNORMAL LOW (ref 150–440)
RBC: 3.12 MIL/uL — ABNORMAL LOW (ref 3.80–5.20)
RDW: 15.2 % — AB (ref 11.5–14.5)
WBC: 5.6 10*3/uL (ref 3.6–11.0)

## 2016-10-14 MED ORDER — LACTULOSE 10 GM/15ML PO SOLN
10.0000 g | Freq: Two times a day (BID) | ORAL | Status: DC | PRN
Start: 2016-10-14 — End: 2016-10-14
  Administered 2016-10-14: 10 g via ORAL
  Filled 2016-10-14: qty 30

## 2016-10-14 MED ORDER — ENOXAPARIN SODIUM 40 MG/0.4ML ~~LOC~~ SOLN: 40.0000 mg | SUBCUTANEOUS | 0 refills | Status: DC

## 2016-10-14 MED ORDER — OXYCODONE HCL 5 MG PO TABS: 5.0000 mg | ORAL_TABLET | ORAL | 0 refills | Status: DC | PRN

## 2016-10-14 MED ORDER — TRAMADOL HCL 50 MG PO TABS: 50.0000 mg | ORAL_TABLET | ORAL | 0 refills | Status: DC | PRN

## 2016-10-14 NOTE — Care Management Important Message (Signed)
Important Message  Patient Details  Name: TANNIKA COPEMAN MRN: [id number] Date of Birth: March 31, 1933   Medicare Important Message Given:  Yes    Jolly Mango, RN 10/14/2016, 9:01 AM

## 2016-10-14 NOTE — Progress Notes (Signed)
   Subjective: 2 Days Post-Op Procedure(s) (LRB): TOTAL KNEE ARTHROPLASTY (Right) Patient reports pain as 3 on 0-10 scale.   Patient is well, and has had no acute complaints or problems Continue with physical  therapy today.  Plan is to go Home after hospital stay. no nausea and no vomiting Patient denies any chest pains or shortness of breath. Objective: Vital signs in last 24 hours: Temp:  [97.6 F (36.4 C)-97.8 F (36.6 C)] 97.8 F (36.6 C) (11/10 0341) Pulse Rate:  [61-77] 77 (11/10 0341) Resp:  [18] 18 (11/10 0341) BP: (113-122)/(55-65) 116/63 (11/10 0341) SpO2:  [92 %-98 %] 92 % (11/10 0341) well approximated incision Heels are non tender and elevated off the bed using rolled towels Intake/Output from previous day: 11/09 0701 - 11/10 0700 In: 720 [P.O.:720] Out: 110 [Drains:110] Intake/Output this shift: No intake/output data recorded.   Recent Labs  10/12/16 0938 10/13/16 0430 10/14/16 0333  HGB 13.8 10.6* 9.3*    Recent Labs  10/13/16 0430 10/14/16 0333  WBC 7.5 5.6  RBC 3.55* 3.12*  HCT 31.2* 27.5*  PLT 100* 85*    Recent Labs  10/13/16 0430 10/14/16 0333  NA 136 138  K 4.3 3.9  CL 104 104  CO2 27 31  BUN 11 13  CREATININE 0.71 0.84  GLUCOSE 123* 89  CALCIUM 8.8* 8.6*   No results for input(s): LABPT, INR in the last 72 hours.  EXAM General - Patient is Alert, Appropriate and Oriented Extremity - Neurologically intact Neurovascular intact Sensation intact distally Intact pulses distally Dorsiflexion/Plantar flexion intact No cellulitis present Compartment soft Dressing - scant drainage Motor Function - intact, moving foot and toes well on exam.    Past Medical History:  Diagnosis Date  . Arthritis   . Breast cancer ([place]) 03/09/15   right breast, radiation  . Breast cancer ([place]) 2013   left breast, radiation  . Breast cancer of lower-outer quadrant of right female breast ([place]) 03/09/2015   3.5 mm invasive carcinoma, low-grade  DCIS. ER 90%, PR greater than 50%, HER-2/neu not amplified. Axillary sentinel node and radiation deferred based on age..  . Cancer ([place]) 2013   with radiation, stage 1, left breast  . Environmental allergies   . Hypothyroidism   . IBS (irritable bowel syndrome)    in the past, diverticulitis in the past.    Assessment/Plan: 2 Days Post-Op Procedure(s) (LRB): TOTAL KNEE ARTHROPLASTY (Right) Active Problems:   S/P total knee arthroplasty  Estimated body mass index is 23.23 kg/m as calculated from the following:   Height as of this encounter: '5\' 6"'$  (1.676 m).   Weight as of this encounter: 65.3 kg (143 lb 14.4 oz). Up with therapy Discharge home with home health  Labs: reviewed DVT Prophylaxis - Lovenox, Foot Pumps and TED hose Weight-Bearing as tolerated to right leg Hemovac discontinued Please change dressing prior to d/c Needs a bowel movement today. Will add lactulose   Jillyn Ledger. [place] 10/14/2016, 7:05 AM

## 2016-10-14 NOTE — Care Management (Addendum)
Cost of Lovenox is $ 55.89. Patient updated. [facility] notified of discharge.

## 2016-10-14 NOTE — Progress Notes (Signed)
Patient was discharged home with home health. Daughter to transport. Reviewed discharge instructions,. Follow-up appointment, changed dressing to drain site, scripts and last dose giving. Belongings sent with patient. Allowed time for questions, Even reviewed lovenox kit with daughter and patient.

## 2016-10-14 NOTE — Progress Notes (Signed)
Physical Therapy Treatment Patient Details Name: Aimee Gutierrez MRN: [id number] DOB: 1933-01-17 Today's Date: 10/14/2016    History of Present Illness Pt. is an 82 y.o. female who was admitted to [facility] for a right TKR.    PT Comments    Pt ready for session.  Daughter in attendance and HEP competed and reviewed with pt and family. Bleeding noted from drain site and nursing in to address.  Pt was able to stand and ambulate to rehab gym with walker and min guard.  Up/down 4 stairs with bilateral rails with ease.  Reviewed safety with pt and daughter.  Returned to room and assisted to bed per her request.    Follow Up Recommendations  Home health PT     Equipment Recommendations  Rolling walker with 5" wheels    Recommendations for Other Services       Precautions / Restrictions Precautions Precautions: Fall;Knee Knee Immobilizer - Right: Discontinue once straight leg raise with < 10 degree lag (able to do 10 strong slr's without assist.  KI not used) Restrictions Weight Bearing Restrictions: Yes RLE Weight Bearing: Weight bearing as tolerated    Mobility  Bed Mobility Overal bed mobility: Needs Assistance Bed Mobility: Sit to Supine       Sit to supine: Supervision      Transfers Overall transfer level: Needs assistance Equipment used: Rolling walker (2 wheeled) Transfers: Sit to/from Stand Sit to Stand: Min guard            Ambulation/Gait Ambulation/Gait assistance: Min guard Ambulation Distance (Feet): 150 Feet (x 2 - rest in rehab gym after stairs) Assistive device: Rolling walker (2 wheeled) Gait Pattern/deviations: Step-through pattern Gait velocity: Decreased Gait velocity interpretation: <1.8 ft/sec, indicative of risk for recurrent falls General Gait Details: gait steady with no lob's (daughter instructed to amb with pt at home due to c/o groggy)   Stairs Stairs: Yes Stairs assistance: Min guard Stair Management: Two rails Number of Stairs: 4     Wheelchair Mobility    Modified Rankin (Stroke Patients Only)       Balance Overall balance assessment: Needs assistance Sitting-balance support: Feet supported Sitting balance-Leahy Scale: Good     Standing balance support: No upper extremity supported Standing balance-Leahy Scale: Fair                      Cognition Arousal/Alertness: Awake/alert Behavior During Therapy: WFL for tasks assessed/performed Overall Cognitive Status: Within Functional Limits for tasks assessed                      Exercises Total Joint Exercises Ankle Circles/Pumps: Strengthening;Both;10 reps;Supine Quad Sets: Strengthening;Both;10 reps;Supine Gluteal Sets: Strengthening;Both;10 reps;Supine Towel Squeeze: Strengthening;Both;10 reps;Supine Short Arc Quad: Strengthening;Right;10 reps;Supine Heel Slides: Strengthening;Right;10 reps;Supine Hip ABduction/ADduction: Strengthening;Right;10 reps;Seated Straight Leg Raises: Strengthening;Right;10 reps;Supine Long Arc Quad: Strengthening;Right;10 reps;Seated Knee Flexion: Strengthening;Right;10 reps;Seated Goniometric ROM: 0-96    General Comments        Pertinent Vitals/Pain Pain Assessment: 0-10 Pain Score: 4  Pain Location: R knee Pain Descriptors / Indicators: Aching Pain Intervention(s): Limited activity within patient's tolerance;Patient requesting pain meds-RN notified    Home Living                      Prior Function            PT Goals (current goals can now be found in the care plan section) Progress towards PT goals: Progressing toward goals  Frequency    BID      PT Plan Current plan remains appropriate    Co-evaluation             End of Session Equipment Utilized During Treatment: Gait belt Activity Tolerance: Patient tolerated treatment well Patient left: with call bell/phone within reach;with family/visitor present;Other (comment);in bed;with bed alarm set;with SCD's  reapplied     Time: [id number] PT Time Calculation (min) (ACUTE ONLY): 48 min  Charges:  $Gait Training: 8-22 mins $Therapeutic Exercise: 8-22 mins $Therapeutic Activity: 8-22 mins                    G Codes:      Chesley Noon Oct 15, 2016, 10:52 AM

## 2016-10-18 ENCOUNTER — Ambulatory Visit: Payer: Medicare Other | Admitting: Hematology and Oncology

## 2016-10-18 ENCOUNTER — Other Ambulatory Visit: Payer: Medicare Other

## 2017-01-31 ENCOUNTER — Ambulatory Visit: Payer: Medicare Other | Attending: Orthopedic Surgery

## 2017-01-31 DIAGNOSIS — M25562 Pain in left knee: Secondary | ICD-10-CM | POA: Diagnosis present

## 2017-01-31 DIAGNOSIS — G8929 Other chronic pain: Secondary | ICD-10-CM | POA: Diagnosis present

## 2017-01-31 DIAGNOSIS — R262 Difficulty in walking, not elsewhere classified: Secondary | ICD-10-CM

## 2017-01-31 NOTE — Therapy (Signed)
[place] [facility] [address] [place], [address] Phone: [phone]   Fax:  [phone]  Physical Therapy Evaluation  Patient Details  Name: Aimee Gutierrez MRN: [id number] Date of Birth: Apr 25, 1933 Referring Provider: Jeneen Rinks Philmon Holley Bouche., MD  Encounter Date: 01/31/2017      PT End of Session - 01/31/17 1453    Visit Number 1   Number of Visits 9   Date for PT Re-Evaluation 03/02/17   Authorization Type 1   Authorization Time Period of 10 gcode   PT Start Time 1453   PT Stop Time 1549   PT Time Calculation (min) 56 min   Activity Tolerance Patient tolerated treatment well   Behavior During Therapy [facility] for tasks assessed/performed      Past Medical History:  Diagnosis Date  . Arthritis   . Breast cancer ([place]) 03/09/15   right breast, radiation  . Breast cancer ([place]) 2013   left breast, radiation  . Breast cancer of lower-outer quadrant of right female breast ([place]) 03/09/2015   3.5 mm invasive carcinoma, low-grade DCIS. ER 90%, PR greater than 50%, HER-2/neu not amplified. Axillary sentinel node and radiation deferred based on age..  . Cancer ([place]) 2013   with radiation, stage 1, left breast  . Environmental allergies   . Hypothyroidism   . IBS (irritable bowel syndrome)    in the past, diverticulitis in the past.  . Osteoporosis     Past Surgical History:  Procedure Laterality Date  . BREAST BIOPSY Right 02/17/15   positive  . BREAST SURGERY Left 2013   [place] Right 03/09/15   wide excision  . CATARACT EXTRACTION  2013, 2015  . OVARY SURGERY  2001   benign  . TOTAL KNEE ARTHROPLASTY Right 10/12/2016   Procedure: TOTAL KNEE ARTHROPLASTY;  Surgeon: Dereck Leep, MD;  Location: [facility] ORS;  Service: Orthopedics;  Laterality: Right;    There were no vitals filed for this visit.       Subjective Assessment - 01/31/17 1457    Subjective L knee pain: 0/10 currently (pt  sitting), 7-8/10 at worst (walking)   Pertinent History L knee pain. Gradual onset for a long time. Had injections in both knees for several years. Her most recent injection last summer 2017 (synvisc), did not help her L knee (used to help). Has not yet had PT for L knee. L knee pain worsened after having therapy for her R knee (using seated leg press).  Pt also states losing her balance when donning and doffing her pants due to her L knee.  Has to sit down to do so.   R TKA doing great.    Diagnostic tests --   Patient Stated Goals I'd like to be able to walk, go shopping (with less L knee pain).    Currently in Pain? No/denies   Pain Score 0-No pain  pt sitting   Pain Location Knee   Pain Orientation Left   Pain Descriptors / Indicators Stabbing   Pain Type Chronic pain   Pain Onset More than a month ago   Pain Frequency Occasional   Aggravating Factors  walking long distances, riding her recumbent bike greater than 8 min. Walking on hard surfaces   Pain Relieving Factors better walking on carpet, sitting, Aleve            [facility] PT Assessment - 01/31/17 1455      Assessment  Medical Diagnosis Osteoarthritis of L knee.    Referring Provider Jeneen Rinks Philmon Holley Bouche., MD   Onset Date/Surgical Date 01/05/17  Date PT referral signed. Chronic condition   Prior Therapy None for L knee     Precautions   Precaution Comments osteoporosis     Restrictions   Other Position/Activity Restrictions no known restrictions     Balance Screen   Has the patient fallen in the past 6 months No   Has the patient had a decrease in activity level because of a fear of falling?  No  pt states fear of falling   Is the patient reluctant to leave their home because of a fear of falling?  No  pt states fear of falling     Home Environment   Additional Comments Patient lives in a 2 story home alone. 1 step to enter with ramp.  R rail to get to second floor. No number of steps inside provided.       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 with AD with L knee pain for the past year (usually uses a SPC, or pushing her husband's wc when he was alive)     Observation/Other Assessments   Observations 6 min walk: (L knee pain inferior lateral to patella at tibia) 578 ft with [facility]   Lower Extremity Functional Scale  28/80     AROM   Left Knee Extension -4   Left Knee Flexion 132     Strength   Right Hip Extension 4-/5   Right Hip ABduction 4/5   Left Hip Extension 3+/5   Left Hip External Rotation 4+/5   Left Hip ABduction 4+/5   Left Knee Flexion 4/5   Left Knee Extension 5/5     Palpation   Palpation comment slight decrease in medial patellar mobility, decreased medial tibial rotation L knee in supine (leg straight)     Ambulation/Gait   Gait Comments SPC on R. R pelvic drop during L LE stance phase, antalgic, decreased stance on L LE.  Decreased L knee extension during L LE stance phase.       Objectives  Manual therapy  Supine medial tibial rotation grade 3 - -  to 3 -  Decreased pain with gait  Improved mobility at knee                     PT Education - 01/31/17 1945    Education provided Yes   Education Details plan of care   Person(s) Educated Patient   Methods Explanation   Comprehension Verbalized understanding             PT Long Term Goals - 01/31/17 1921      PT LONG TERM GOAL #1   Title Patient will have a decrease in L knee pain to 4/10 or less L knee pain at worst to promote ability to ambulate longer distances, perform functional tasks.   Baseline 8/10 L knee pain at worst (01/31/2017)   Time 4   Period Weeks   Status New     PT LONG TERM GOAL #2   Title Patient will improve her 6 minute walk distance to at least 628 ft with [facility] to promote mobility.    Baseline 578 ft with SPC (01/31/2017)   Time 4   Period Weeks   Status New     PT LONG TERM GOAL #3   Title Patient will improve her  LEFS score by at least 9 points as a demonstration of improved function.    Baseline 28/80 (01/31/2017)   Time 4   Period Weeks   Status New     PT LONG TERM GOAL #4   Title Patient will improve bilateral hip extension, and hip abduction strength by at least 1/2 MMT to promote ability to perform standing tasks.    Time 4   Period Weeks   Status New     PT LONG TERM GOAL #5   Title Patient will improve L knee flexion strength by at least 1/2 MMT grade to promote ability to ambulate with less L knee pain.   Time 4   Period Weeks   Status New               Plan - 01/31/17 1912    Clinical Impression Statement Patient is an 83 year old female who came to physical therapy secondary to L knee pain. She also presents with altered gait pattern, bilateral glute max weakness, decreased medial patellar, and medial tibial rotation (with knee in extension) mobility, and difficulty performing functional task such as walking longer distances, and stair negotiation. Patient will benefit from skilled physical therapy services to address the aforementioned deficits.     Rehab Potential Good   Clinical Impairments Affecting Rehab Potential Chronicity of condition   PT Frequency 2x / week   PT Duration 4 weeks   PT Treatment/Interventions Therapeutic activities;Therapeutic exercise;Manual techniques;Dry needling;Patient/family education;Neuromuscular re-education;Ultrasound;Iontophoresis '4mg'$ /ml Dexamethasone;Electrical Stimulation;Aquatic Therapy   PT Next Visit Plan manual therapy, hip and knee strengthening, pelvic control   Consulted and Agree with Plan of Care Patient      Patient will benefit from skilled therapeutic intervention in order to improve the following deficits and impairments:  Pain, Improper body mechanics, Decreased strength, Difficulty walking  Visit Diagnosis: Chronic pain of left knee - Plan: PT plan of care cert/re-cert  Difficulty in walking, not elsewhere classified -  Plan: PT plan of care cert/re-cert      G-Codes - 88/28/00 1933    Functional Assessment Tool Used (Outpatient Only) LEFS, clinical presentation, patient interview   Functional Limitation Mobility: Walking and moving around   Mobility: Walking and Moving Around Current Status [phone]) At least 60 percent but less than 80 percent impaired, limited or restricted   Mobility: Walking and Moving Around Goal Status [phone]) At least 20 percent but less than 40 percent impaired, limited or restricted   Mobility: Walking and Moving Around Discharge Status [phone]) --       Problem List Patient Active Problem List   Diagnosis Date Noted  . S/P total knee arthroplasty 10/12/2016  . Chronic ITP (idiopathic thrombocytopenic purpura) (HCC) 10/06/2016  . Carcinoma in situ, breast, ductal 08/03/2015  . Acid indigestion 07/02/2015  . Adult hypothyroidism 07/02/2015  . Arthritis, degenerative 07/02/2015  . Change in blood platelet count 07/02/2015  . Osteoporosis 06/04/2015  . Invasive ductal carcinoma of right breast, stage 1 ([place]) 03/13/2015  . Invasive ductal carcinoma of left breast, stage 1 ([place]) 02/26/2015    Joneen Boers PT, DPT   01/31/2017, 7:50 PM  [place] [facility] [address], [place], [address] Phone: [phone]   Fax:  [phone]  Name: Aimee Gutierrez MRN: [id number] Date of Birth: [birth year]

## 2017-02-02 ENCOUNTER — Ambulatory Visit: Payer: Medicare Other | Attending: Orthopedic Surgery

## 2017-02-02 DIAGNOSIS — M25562 Pain in left knee: Secondary | ICD-10-CM | POA: Insufficient documentation

## 2017-02-02 DIAGNOSIS — R262 Difficulty in walking, not elsewhere classified: Secondary | ICD-10-CM | POA: Insufficient documentation

## 2017-02-02 DIAGNOSIS — G8929 Other chronic pain: Secondary | ICD-10-CM | POA: Insufficient documentation

## 2017-02-02 NOTE — Patient Instructions (Signed)
  Sitting on a chair:    Squeeze your rear end muscles together.    Hold for 5 seconds.    Perform throughout the day.

## 2017-02-02 NOTE — Therapy (Signed)
[place] [facility] [address] [place], [address] Phone: [phone]   Fax:  [phone]  Physical Therapy Treatment  Patient Details  Name: Aimee Gutierrez MRN: [id number] Date of Birth: 09/11/1933 Referring Provider: Jeneen Rinks Philmon Holley Bouche., MD  Encounter Date: 02/02/2017      PT End of Session - 02/02/17 1033    Visit Number 2   Number of Visits 9   Date for PT Re-Evaluation 03/02/17   Authorization Type 2   Authorization Time Period of 10 gcode   PT Start Time 1033   PT Stop Time 1116   PT Time Calculation (min) 43 min   Activity Tolerance Patient tolerated treatment well   Behavior During Therapy [facility] for tasks assessed/performed      Past Medical History:  Diagnosis Date  . Arthritis   . Breast cancer ([place]) 03/09/15   right breast, radiation  . Breast cancer ([place]) 2013   left breast, radiation  . Breast cancer of lower-outer quadrant of right female breast ([place]) 03/09/2015   3.5 mm invasive carcinoma, low-grade DCIS. ER 90%, PR greater than 50%, HER-2/neu not amplified. Axillary sentinel node and radiation deferred based on age..  . Cancer ([place]) 2013   with radiation, stage 1, left breast  . Environmental allergies   . Hypothyroidism   . IBS (irritable bowel syndrome)    in the past, diverticulitis in the past.  . Osteoporosis     Past Surgical History:  Procedure Laterality Date  . BREAST BIOPSY Right 02/17/15   positive  . BREAST SURGERY Left 2013   [place] Right 03/09/15   wide excision  . CATARACT EXTRACTION  2013, 2015  . OVARY SURGERY  2001   benign  . TOTAL KNEE ARTHROPLASTY Right 10/12/2016   Procedure: TOTAL KNEE ARTHROPLASTY;  Surgeon: Dereck Leep, MD;  Location: [facility] ORS;  Service: Orthopedics;  Laterality: Right;    There were no vitals filed for this visit.      Subjective Assessment - 02/02/17 1036    Subjective Leg felt better getting up in the  morning yesterday. But L knee bothered her again this morning. The barometric pressure is up yesterday. Today, the barometric pressure is down.  L knee felt thick, like it had more support after last session and was not painful. Felt better.  1-2/10 L knee pain when walking currently.    Pertinent History L knee pain. Gradual onset for a long time. Had injections in both knees for several years. Her most recent injection last summer 2017 (synvisc), did not help her L knee (used to help). Has not yet had PT for L knee. L knee pain worsened after having therapy for her R knee (using seated leg press).  Pt also states losing her balance when donning and doffing her pants due to her L knee.  Has to sit down to do so.   R TKA doing great.    Patient Stated Goals I'd like to be able to walk, go shopping (with less L knee pain).    Currently in Pain? Yes   Pain Score 2    Pain Orientation Left   Pain Onset More than a month ago      Objectives  There-ex  Directed patient with seated gentle manually resisted L knee flexion targeting the medial hamstring 10x3   L medial knee discomfort with gait   Then with gentle manually resistance to lateral  hamstrings 10x3   L knee Felt better with gait per pt  Seated hip adductor ball squeeze with glute max squeeze 8x5 seconds for 2 sets  Difficulty with glute max activation per pt  decreased L knee pain with gait   Gait throughout gym with [facility] assessing effectiveness of treatments  standing glute max squeeze 10x5 seconds  Then in sitting 10x5 seconds  Gave seated glute max squeezes throughout the day as part of HEP. Pt demonstrated and verbalized understanding.    Improved exercise technique, movement at target joints, use of target muscles after mod verbal, visual, tactile cues.     Manual therapy  Supine medial tibial rotation grade 3 - -  to 3 -             Decreased pain with gait     Decreased L knee pain with gait after manual  therapy to L knee as well as exercises to promote lateral hamstring, VMO muscle, and glute max muscle use.                        PT Education - 02/02/17 1048    Education provided Yes   Education Details ther-ex, HEP   Person(s) Educated Patient   Methods Explanation;Demonstration;Tactile cues;Verbal cues;Handout   Comprehension Returned demonstration;Verbalized understanding             PT Long Term Goals - 01/31/17 1921      PT LONG TERM GOAL #1   Title Patient will have a decrease in L knee pain to 4/10 or less L knee pain at worst to promote ability to ambulate longer distances, perform functional tasks.   Baseline 8/10 L knee pain at worst (01/31/2017)   Time 4   Period Weeks   Status New     PT LONG TERM GOAL #2   Title Patient will improve her 6 minute walk distance to at least 628 ft with [facility] to promote mobility.    Baseline 578 ft with SPC (01/31/2017)   Time 4   Period Weeks   Status New     PT LONG TERM GOAL #3   Title Patient will improve her LEFS score by at least 9 points as a demonstration of improved function.    Baseline 28/80 (01/31/2017)   Time 4   Period Weeks   Status New     PT LONG TERM GOAL #4   Title Patient will improve bilateral hip extension, and hip abduction strength by at least 1/2 MMT to promote ability to perform standing tasks.    Time 4   Period Weeks   Status New     PT LONG TERM GOAL #5   Title Patient will improve L knee flexion strength by at least 1/2 MMT grade to promote ability to ambulate with less L knee pain.   Time 4   Period Weeks   Status New               Plan - 02/02/17 1048    Clinical Impression Statement Decreased L knee pain with gait after manual therapy to L knee as well as exercises to promote lateral hamstring, VMO muscle, and glute max muscle use.    Rehab Potential Good   Clinical Impairments Affecting Rehab Potential Chronicity of condition   PT Frequency 2x / week   PT  Duration 4 weeks   PT Treatment/Interventions Therapeutic activities;Therapeutic exercise;Manual techniques;Dry needling;Patient/family education;Neuromuscular re-education;Ultrasound;Iontophoresis 60m/ml Dexamethasone;Electrical Stimulation;Aquatic Therapy  PT Next Visit Plan manual therapy, hip and knee strengthening, pelvic control   Consulted and Agree with Plan of Care Patient      Patient will benefit from skilled therapeutic intervention in order to improve the following deficits and impairments:  Pain, Improper body mechanics, Decreased strength, Difficulty walking  Visit Diagnosis: Chronic pain of left knee  Difficulty in walking, not elsewhere classified     Problem List Patient Active Problem List   Diagnosis Date Noted  . S/P total knee arthroplasty 10/12/2016  . Chronic ITP (idiopathic thrombocytopenic purpura) (HCC) 10/06/2016  . Carcinoma in situ, breast, ductal 08/03/2015  . Acid indigestion 07/02/2015  . Adult hypothyroidism 07/02/2015  . Arthritis, degenerative 07/02/2015  . Change in blood platelet count 07/02/2015  . Osteoporosis 06/04/2015  . Invasive ductal carcinoma of right breast, stage 1 ([place]) 03/13/2015  . Invasive ductal carcinoma of left breast, stage 1 ([place]) 02/26/2015    Joneen Boers PT, DPT   02/02/2017, 12:09 PM  [place] [facility] [address], [place], [address] Phone: [phone]   Fax:  [phone]  Name: Aimee Gutierrez MRN: [id number] Date of Birth: 02/27/1933

## 2017-02-06 ENCOUNTER — Ambulatory Visit: Payer: Medicare Other

## 2017-02-06 DIAGNOSIS — R262 Difficulty in walking, not elsewhere classified: Secondary | ICD-10-CM

## 2017-02-06 DIAGNOSIS — M25562 Pain in left knee: Principal | ICD-10-CM

## 2017-02-06 DIAGNOSIS — G8929 Other chronic pain: Secondary | ICD-10-CM

## 2017-02-06 NOTE — Therapy (Signed)
[place] [facility] [address] [place], [address] Phone: [phone]   Fax:  [phone]  Physical Therapy Treatment  Patient Details  Name: Aimee Gutierrez MRN: [id number] Date of Birth: [birth year] Referring Provider: Jeneen Rinks Philmon Holley Bouche., MD  Encounter Date: 02/06/2017      PT End of Session - 02/06/17 1112    Visit Number 3   Number of Visits 9   Date for PT Re-Evaluation 03/02/17   Authorization Type 3   Authorization Time Period of 10 gcode   PT Start Time 1112   PT Stop Time 1209   PT Time Calculation (min) 57 min   Activity Tolerance Patient tolerated treatment well   Behavior During Therapy [facility] for tasks assessed/performed      Past Medical History:  Diagnosis Date  . Arthritis   . Breast cancer ([place]) 03/09/15   right breast, radiation  . Breast cancer ([place]) 2013   left breast, radiation  . Breast cancer of lower-outer quadrant of right female breast ([place]) 03/09/2015   3.5 mm invasive carcinoma, low-grade DCIS. ER 90%, PR greater than 50%, HER-2/neu not amplified. Axillary sentinel node and radiation deferred based on age..  . Cancer ([place]) 2013   with radiation, stage 1, left breast  . Environmental allergies   . Hypothyroidism   . IBS (irritable bowel syndrome)    in the past, diverticulitis in the past.  . Osteoporosis     Past Surgical History:  Procedure Laterality Date  . BREAST BIOPSY Right 02/17/15   positive  . BREAST SURGERY Left 2013   [place] Right 03/09/15   wide excision  . CATARACT EXTRACTION  2013, 2015  . OVARY SURGERY  2001   benign  . TOTAL KNEE ARTHROPLASTY Right 10/12/2016   Procedure: TOTAL KNEE ARTHROPLASTY;  Surgeon: Dereck Leep, MD;  Location: [facility] ORS;  Service: Orthopedics;  Laterality: Right;    There were no vitals filed for this visit.      Subjective Assessment - 02/06/17 1114    Subjective Pt states that this morning she is  better. Feels like she has more support in her L knee. Saturday her L knee bothered her (both knees).  No L knee pain currently.    Pertinent History L knee pain. Gradual onset for a long time. Had injections in both knees for several years. Her most recent injection last summer 2017 (synvisc), did not help her L knee (used to help). Has not yet had PT for L knee. L knee pain worsened after having therapy for her R knee (using seated leg press).  Pt also states losing her balance when donning and doffing her pants due to her L knee.  Has to sit down to do so.   R TKA doing great.    Patient Stated Goals I'd like to be able to walk, go shopping (with less L knee pain).    Currently in Pain? No/denies   Pain Score 0-No pain   Pain Onset More than a month ago                                 PT Education - 02/06/17 1137    Education provided Yes   Education Details ther-ex, HEP   Person(s) Educated Patient   Methods Explanation;Demonstration;Tactile cues;Verbal cues;Handout   Comprehension Returned demonstration;Verbalized understanding  Objectives  There-ex  Directed patient with seated L knee flexion resisting yellow band 10x2  Seated L knee extension AROM 10x5 seconds for 2 sets  Seated hip adductor ball squeeze with glute max squeeze 10x5 seconds for 2 sets  Reviewed HEP. Pt demonstrated and verbalized understanding.    Vitals obtained per pt request. No light headedness, dizziness, blurred vision. Asymptomatic Blood pressure L arm sitting, mechanically taken: 129/ 79, HR 72   standing glute max squeeze 10x5 seconds for 2 sets  Lateral walk on air Ex beam with bilateral UE assist 5x each way  Forward step up onto and over and back on Air Ex Pad with L LE 10x with R UE assist   Then 8x. L knee pain/discomfort              Seated manually resisted L hamstring flexion targeting the lateral hamstrings 10x2     Improved exercise technique,  movement at target joints, use of target muscles after mod verbal, visual, tactile cues.      Manual therapy  Supine medial tibial rotation grade 3 - - to 3 - Decreased pain with gait  Increased L knee discomfort after forward step up onto and over and back on Air Ex Pad. No knee pain after manual therapy. Worked on glute and L knee strengthening and control.         PT Long Term Goals - 01/31/17 1921      PT LONG TERM GOAL #1   Title Patient will have a decrease in L knee pain to 4/10 or less L knee pain at worst to promote ability to ambulate longer distances, perform functional tasks.   Baseline 8/10 L knee pain at worst (01/31/2017)   Time 4   Period Weeks   Status New     PT LONG TERM GOAL #2   Title Patient will improve her 6 minute walk distance to at least 628 ft with [facility] to promote mobility.    Baseline 578 ft with SPC (01/31/2017)   Time 4   Period Weeks   Status New     PT LONG TERM GOAL #3   Title Patient will improve her LEFS score by at least 9 points as a demonstration of improved function.    Baseline 28/80 (01/31/2017)   Time 4   Period Weeks   Status New     PT LONG TERM GOAL #4   Title Patient will improve bilateral hip extension, and hip abduction strength by at least 1/2 MMT to promote ability to perform standing tasks.    Time 4   Period Weeks   Status New     PT LONG TERM GOAL #5   Title Patient will improve L knee flexion strength by at least 1/2 MMT grade to promote ability to ambulate with less L knee pain.   Time 4   Period Weeks   Status New               Plan - 02/06/17 1138    Clinical Impression Statement Increased L knee discomfort after forward step up onto and over and back on Air Ex Pad. No knee pain after manual therapy. Worked on glute and L knee strengthening and control.   Rehab Potential Good   Clinical Impairments Affecting Rehab Potential Chronicity of condition   PT Frequency 2x / week   PT  Duration 4 weeks   PT Treatment/Interventions Therapeutic activities;Therapeutic exercise;Manual techniques;Dry needling;Patient/family education;Neuromuscular re-education;Ultrasound;Iontophoresis '4mg'$ /ml Dexamethasone;Electrical Stimulation;Aquatic Therapy  PT Next Visit Plan manual therapy, hip and knee strengthening, pelvic control   Consulted and Agree with Plan of Care Patient      Patient will benefit from skilled therapeutic intervention in order to improve the following deficits and impairments:  Pain, Improper body mechanics, Decreased strength, Difficulty walking  Visit Diagnosis: Chronic pain of left knee  Difficulty in walking, not elsewhere classified     Problem List Patient Active Problem List   Diagnosis Date Noted  . S/P total knee arthroplasty 10/12/2016  . Chronic ITP (idiopathic thrombocytopenic purpura) (HCC) 10/06/2016  . Carcinoma in situ, breast, ductal 08/03/2015  . Acid indigestion 07/02/2015  . Adult hypothyroidism 07/02/2015  . Arthritis, degenerative 07/02/2015  . Change in blood platelet count 07/02/2015  . Osteoporosis 06/04/2015  . Invasive ductal carcinoma of right breast, stage 1 ([place]) 03/13/2015  . Invasive ductal carcinoma of left breast, stage 1 ([place]) 02/26/2015    Joneen Boers PT, DPT  02/06/2017, 12:20 PM  [place] [facility] [address] [place], [address] Phone: [phone]   Fax:  [phone]  Name: Aimee Gutierrez MRN: [id number] Date of Birth: 25-Dec-1932

## 2017-02-06 NOTE — Patient Instructions (Addendum)
  EXTENSION: Sitting (Active)    Sit with feet flat. Straighten left knee. Use __0_ lbs.  Hold for 5 seconds  Complete __2_ sets of _10_ repetitions. Perform _1__ sessions per day.  http://gtsc.exer.us/268   Copyright  VHI. All rights reserved.     Knee Flexion: Resisted (Sitting)    Sit with band under left foot and looped around table (not shown). Pull unsupported leg back. Repeat ___10_ times per set. Do __2__ sets per session. Do __1__ session every other day.  http://orth.exer.us/695   Copyright  VHI. All rights reserved.      Adduction: Hip - Knees Together (Sitting)    Sit with thick pillows between knees. Gently push knees together. Hold for _5__ seconds.   Pain free level of effort.   Repeat __10_ times. Do __2  times every other day.  Copyright  VHI. All rights reserved.

## 2017-02-08 ENCOUNTER — Ambulatory Visit: Payer: Medicare Other

## 2017-02-08 DIAGNOSIS — R262 Difficulty in walking, not elsewhere classified: Secondary | ICD-10-CM

## 2017-02-08 DIAGNOSIS — M25562 Pain in left knee: Secondary | ICD-10-CM | POA: Diagnosis not present

## 2017-02-08 DIAGNOSIS — G8929 Other chronic pain: Secondary | ICD-10-CM

## 2017-02-08 NOTE — Therapy (Signed)
[facility] [address], [place], [address] Phone: [phone]   Fax:  [phone]  Physical Therapy Treatment  Patient Details  Name: Aimee Gutierrez MRN: [id number] Date of Birth: 28-Nov-1933 Referring Provider: Jeneen Rinks Philmon Holley Bouche., MD  Encounter Date: 02/08/2017      PT End of Session - 02/08/17 1120    Visit Number 4   Number of Visits 9   Date for PT Re-Evaluation 03/02/17   Authorization Type 4   Authorization Time Period of 10 gcode   PT Start Time 1120   PT Stop Time 1206   PT Time Calculation (min) 46 min   Activity Tolerance Patient tolerated treatment well   Behavior During Therapy [facility] for tasks assessed/performed      Past Medical History:  Diagnosis Date  . Arthritis   . Breast cancer ([place]) 03/09/15   right breast, radiation  . Breast cancer ([place]) 2013   left breast, radiation  . Breast cancer of lower-outer quadrant of right female breast ([place]) 03/09/2015   3.5 mm invasive carcinoma, low-grade DCIS. ER 90%, PR greater than 50%, HER-2/neu not amplified. Axillary sentinel node and radiation deferred based on age..  . Cancer ([place]) 2013   with radiation, stage 1, left breast  . Environmental allergies   . Hypothyroidism   . IBS (irritable bowel syndrome)    in the past, diverticulitis in the past.  . Osteoporosis     Past Surgical History:  Procedure Laterality Date  . BREAST BIOPSY Right 02/17/15   positive  . BREAST SURGERY Left 2013   [place] Right 03/09/15   wide excision  . CATARACT EXTRACTION  2013, 2015  . OVARY SURGERY  2001   benign  . TOTAL KNEE ARTHROPLASTY Right 10/12/2016   Procedure: TOTAL KNEE ARTHROPLASTY;  Surgeon: Dereck Leep, MD;  Location: [facility] ORS;  Service: Orthopedics;  Laterality: Right;    There were no vitals filed for this visit.      Subjective Assessment - 02/08/17 1122    Subjective Pt stated she felt good after last  session. Was walking so well. Woke up yesterday morning and both knees bothered her when she got out of bed and it never went away. No pain both knees currently in sitting. Were pretty bad this morning and has gotten some better. They don't hurt when she is off of them. 3/10 L knee when walking from waiting room to treatment room.    Pertinent History L knee pain. Gradual onset for a long time. Had injections in both knees for several years. Her most recent injection last summer 2017 (synvisc), did not help her L knee (used to help). Has not yet had PT for L knee. L knee pain worsened after having therapy for her R knee (using seated leg press).  Pt also states losing her balance when donning and doffing her pants due to her L knee.  Has to sit down to do so.   R TKA doing great.    Patient Stated Goals I'd like to be able to walk, go shopping (with less L knee pain).    Currently in Pain? Yes   Pain Score 3    Pain Onset More than a month ago                                 PT  Education - 02/08/17 1128    Education provided Yes   Education Details ther-ex   [employer]) Educated Patient   Methods Explanation;Demonstration;Tactile cues;Verbal cues   Comprehension Returned demonstration;Verbalized understanding        Objectives  There-ex  Directed patient with seated hip adductor ball squeeze with glute max squeeze 10x5 seconds for 2 sets   Seated L knee extension 10x5 seconds   seated L knee flexion resisting yellow band 10x2  Gait around the gym with SPC 100 ft, then 100 ft with glute max squeeze during L LE stance phase. No L knee pain with gait with addition of glute squeeze  Seated L knee flexion rolling a small physioball (45 cm)  with L foot 10x3. Difficulty with femoral control, improves with concentration and practice.    Supine L hip extension isometrics with leg straight 5x3 with 5 second holds  Reviewed HEP. Pt demonstrated and verbalized  understanding.   standing L hip extensoin with bilateral UE assist 10x. L posterior low back area discomfort which eased with addition of L hip abduction/extension. Felt more glute muscle use.   Seated trunk flexion 4x. Reviewed and given as part of her HEP. L low back area discomfort felt better.   Improved exercise technique, movement at target joints, use of target muscles after mod verbal, visual, tactile cues.      Improved L knee symptoms with gait with activation of L glute max muscles. Pt also demonstrates L LE weakness and worked on improving glute, quadriceps, and hamstring strength to help decrease L knee pain and improve ability to ambulate.         PT Long Term Goals - 01/31/17 1921      PT LONG TERM GOAL #1   Title Patient will have a decrease in L knee pain to 4/10 or less L knee pain at worst to promote ability to ambulate longer distances, perform functional tasks.   Baseline 8/10 L knee pain at worst (01/31/2017)   Time 4   Period Weeks   Status New     PT LONG TERM GOAL #2   Title Patient will improve her 6 minute walk distance to at least 628 ft with [facility] to promote mobility.    Baseline 578 ft with SPC (01/31/2017)   Time 4   Period Weeks   Status New     PT LONG TERM GOAL #3   Title Patient will improve her LEFS score by at least 9 points as a demonstration of improved function.    Baseline 28/80 (01/31/2017)   Time 4   Period Weeks   Status New     PT LONG TERM GOAL #4   Title Patient will improve bilateral hip extension, and hip abduction strength by at least 1/2 MMT to promote ability to perform standing tasks.    Time 4   Period Weeks   Status New     PT LONG TERM GOAL #5   Title Patient will improve L knee flexion strength by at least 1/2 MMT grade to promote ability to ambulate with less L knee pain.   Time 4   Period Weeks   Status New               Plan - 02/08/17 1129    Clinical Impression Statement Improved L knee symptoms  with gait with activation of L glute max muscles. Pt also demonstrates L LE weakness and worked on improving glute, quadriceps, and hamstring strength to help decrease  L knee pain and improve ability to ambulate.    Rehab Potential Good   Clinical Impairments Affecting Rehab Potential Chronicity of condition   PT Frequency 2x / week   PT Duration 4 weeks   PT Treatment/Interventions Therapeutic activities;Therapeutic exercise;Manual techniques;Dry needling;Patient/family education;Neuromuscular re-education;Ultrasound;Iontophoresis '4mg'$ /ml Dexamethasone;Electrical Stimulation;Aquatic Therapy   PT Next Visit Plan manual therapy, hip and knee strengthening, pelvic control   Consulted and Agree with Plan of Care Patient      Patient will benefit from skilled therapeutic intervention in order to improve the following deficits and impairments:  Pain, Improper body mechanics, Decreased strength, Difficulty walking  Visit Diagnosis: Chronic pain of left knee  Difficulty in walking, not elsewhere classified     Problem List Patient Active Problem List   Diagnosis Date Noted  . S/P total knee arthroplasty 10/12/2016  . Chronic ITP (idiopathic thrombocytopenic purpura) (HCC) 10/06/2016  . Carcinoma in situ, breast, ductal 08/03/2015  . Acid indigestion 07/02/2015  . Adult hypothyroidism 07/02/2015  . Arthritis, degenerative 07/02/2015  . Change in blood platelet count 07/02/2015  . Osteoporosis 06/04/2015  . Invasive ductal carcinoma of right breast, stage 1 ([place]) 03/13/2015  . Invasive ductal carcinoma of left breast, stage 1 ([place]) 02/26/2015    Joneen Boers PT, DPT  02/08/2017, 6:45 PM  [place] [facility] [address] [place], [address] Phone: [phone]   Fax:  [phone]  Name: LORIELLE BOEHNING MRN: [id number] Date of Birth: 1933/04/22

## 2017-02-08 NOTE — Patient Instructions (Addendum)
   On your back, left knee straight:    Squeeze your rear end muscles.    Gently press your entire left leg onto the bed.    Hold for 5 seconds   Repeat 5 times.    Perform 3 sets daily.       Sitting on a chair:   Roll a ball with your left foot   Keep your thigh from wobbling.    Do 10 repetitions.    Perform 3 sets daily.      You can use a basket ball, soccer ball, or physioball/exercise ball (we use a 45 cm ball)

## 2017-02-09 ENCOUNTER — Ambulatory Visit: Payer: Medicare Other | Admitting: Radiation Oncology

## 2017-02-13 ENCOUNTER — Ambulatory Visit: Payer: Medicare Other

## 2017-02-15 ENCOUNTER — Ambulatory Visit: Payer: Medicare Other

## 2017-02-15 DIAGNOSIS — M25562 Pain in left knee: Principal | ICD-10-CM

## 2017-02-15 DIAGNOSIS — G8929 Other chronic pain: Secondary | ICD-10-CM

## 2017-02-15 DIAGNOSIS — R262 Difficulty in walking, not elsewhere classified: Secondary | ICD-10-CM

## 2017-02-15 NOTE — Therapy (Signed)
[place] [facility] [address] [place], [address] Phone: [phone]   Fax:  [phone]  Physical Therapy Treatment  Patient Details  Name: Aimee Gutierrez MRN: [id number] Date of Birth: Aug 26, 1933 Referring Provider: Jeneen Rinks Philmon Holley Bouche., MD  Encounter Date: 02/15/2017      PT End of Session - 02/15/17 1119    Visit Number 5   Number of Visits 9   Date for PT Re-Evaluation 03/02/17   Authorization Type 5   Authorization Time Period of 10 gcode   PT Start Time 1119   PT Stop Time 1201   PT Time Calculation (min) 42 min   Activity Tolerance Patient tolerated treatment well   Behavior During Therapy [facility] for tasks assessed/performed      Past Medical History:  Diagnosis Date  . Arthritis   . Breast cancer ([place]) 03/09/15   right breast, radiation  . Breast cancer ([place]) 2013   left breast, radiation  . 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 ER 90%, PR greater than 50%, HER-2/neu not amplified. Axillary sentinel node and radiation deferred based on age..  . Cancer ([place]) 2013   with radiation, stage 1, left breast  . Environmental allergies   . Hypothyroidism   . IBS (irritable bowel syndrome)    in the past, diverticulitis in the past.  . Osteoporosis     Past Surgical History:  Procedure Laterality Date  . BREAST BIOPSY Right 02/17/15   positive  . BREAST SURGERY Left 2013   [place] Right 03/09/15   wide excision  . CATARACT EXTRACTION  2013, 2015  . OVARY SURGERY  2001   benign  . TOTAL KNEE ARTHROPLASTY Right 10/12/2016   Procedure: TOTAL KNEE ARTHROPLASTY;  Surgeon: Dereck Leep, MD;  Location: [facility] ORS;  Service: Orthopedics;  Laterality: Right;    There were no vitals filed for this visit.      Subjective Assessment - 02/15/17 1121    Subjective I am so much better. No knee pain  currently when walking.  Had a little bit of pain but not enough to bother her.  Has been doing her HEP (except the sitting ball rolls knee flexion/extension) without problems. Pain went down since last session. Maybe 1-2/10 L knee pain at most since last session.    Pertinent History L knee pain. Gradual onset for a long time. Had injections in both knees for several years. Her most recent injection last summer 2017 (synvisc), did not help her L knee (used to help). Has not yet had PT for L knee. L knee pain worsened after having therapy for her R knee (using seated leg press).  Pt also states losing her balance when donning and doffing her pants due to her L knee.  Has to sit down to do so.   R TKA doing great.    Patient Stated Goals I'd like to be able to walk, go shopping (with less L knee pain).    Currently in Pain? No/denies   Pain Score 0-No pain   Pain Onset More than a month ago                                 PT Education - 02/15/17 1125    Education provided Yes   Education Details  ther-ex   Person(s) Educated Patient   Methods Explanation;Demonstration;Tactile cues;Verbal cues   Comprehension Verbalized understanding;Returned demonstration        Objectives  There-ex  Directed patient with seated L knee extension 10x5 seconds. Slight L lateral knee discomfort which eases with rest.   Seated L knee flexion rolling a small physioball (45 cm)  with L foot 10x3.   seated hip adductor ball squeeze with glute max squeeze 10x5 seconds for 2 sets  seated L knee flexion resisting yellow band 10x2  Supine L hip extension isometrics with leg straight 10x with 5 second holds to promote glute max muscle use.  Gait around gym with [place] 32 ft x 4. L knee symptoms at first with improved with glute max squeeze during L LE stance phase.     Improved exercise technique, movement at target joints, use of target muscles after min to mod verbal, visual, tactile  cues.     Manual therapy  Supine medial tibial rotation grade 3 - - to 3 -  No L knee pain with gait.    Good carry over of decreased L knee pain from last session. Continue working on LE strengthening, especially gluteus maximus muscle.            PT Long Term Goals - 01/31/17 1921      PT LONG TERM GOAL #1   Title Patient will have a decrease in L knee pain to 4/10 or less L knee pain at worst to promote ability to ambulate longer distances, perform functional tasks.   Baseline 8/10 L knee pain at worst (01/31/2017)   Time 4   Period Weeks   Status New     PT LONG TERM GOAL #2   Title Patient will improve her 6 minute walk distance to at least 628 ft with [facility] to promote mobility.    Baseline 578 ft with SPC (01/31/2017)   Time 4   Period Weeks   Status New     PT LONG TERM GOAL #3   Title Patient will improve her LEFS score by at least 9 points as a demonstration of improved function.    Baseline 28/80 (01/31/2017)   Time 4   Period Weeks   Status New     PT LONG TERM GOAL #4   Title Patient will improve bilateral hip extension, and hip abduction strength by at least 1/2 MMT to promote ability to perform standing tasks.    Time 4   Period Weeks   Status New     PT LONG TERM GOAL #5   Title Patient will improve L knee flexion strength by at least 1/2 MMT grade to promote ability to ambulate with less L knee pain.   Time 4   Period Weeks   Status New               Plan - 02/15/17 1127    Clinical Impression Statement Good carry over of decreased L knee pain from last session. Continue working on LE strengthening, especially gluteus maximus muscle.    Rehab Potential Good   Clinical Impairments Affecting Rehab Potential Chronicity of condition   PT Frequency 2x / week   PT Duration 4 weeks   PT Treatment/Interventions Therapeutic activities;Therapeutic exercise;Manual techniques;Dry needling;Patient/family education;Neuromuscular  re-education;Ultrasound;Iontophoresis '4mg'$ /ml Dexamethasone;Electrical Stimulation;Aquatic Therapy   PT Next Visit Plan manual therapy, hip and knee strengthening, pelvic control   Consulted and Agree with Plan of Care Patient      Patient will  benefit from skilled therapeutic intervention in order to improve the following deficits and impairments:  Pain, Improper body mechanics, Decreased strength, Difficulty walking  Visit Diagnosis: Chronic pain of left knee  Difficulty in walking, not elsewhere classified     Problem List Patient Active Problem List   Diagnosis Date Noted  . S/P total knee arthroplasty 10/12/2016  . Chronic ITP (idiopathic thrombocytopenic purpura) (HCC) 10/06/2016  . Carcinoma in situ, breast, ductal 08/03/2015  . Acid indigestion 07/02/2015  . Adult hypothyroidism 07/02/2015  . Arthritis, degenerative 07/02/2015  . Change in blood platelet count 07/02/2015  . Osteoporosis 06/04/2015  . Invasive ductal carcinoma of right breast, stage 1 ([place]) 03/13/2015  . Invasive ductal carcinoma of left breast, stage 1 ([place]) 02/26/2015    Joneen Boers PT, DPT   02/15/2017, 12:15 PM  [place] [facility] [address] [place], [address] Phone: [phone]   Fax:  [phone]  Name: AIDELIZ GARMANY MRN: [id number] Date of Birth: July 07, 1933

## 2017-02-21 ENCOUNTER — Ambulatory Visit
Admission: RE | Admit: 2017-02-21 | Discharge: 2017-02-21 | Disposition: A | Discharge status: Home / Self Care | Payer: Medicare Other | Source: Ambulatory Visit | Attending: Hematology and Oncology | Admitting: Hematology and Oncology

## 2017-02-21 ENCOUNTER — Other Ambulatory Visit: Payer: Self-pay | Admitting: Hematology and Oncology

## 2017-02-21 DIAGNOSIS — Z9889 Other specified postprocedural states: Secondary | ICD-10-CM | POA: Diagnosis not present

## 2017-02-21 DIAGNOSIS — C50911 Malignant neoplasm of unspecified site of right female breast: Secondary | ICD-10-CM | POA: Diagnosis not present

## 2017-02-21 DIAGNOSIS — C50912 Malignant neoplasm of unspecified site of left female breast: Secondary | ICD-10-CM

## 2017-02-22 ENCOUNTER — Ambulatory Visit: Payer: Medicare Other

## 2017-02-22 DIAGNOSIS — M25562 Pain in left knee: Secondary | ICD-10-CM | POA: Diagnosis not present

## 2017-02-22 DIAGNOSIS — G8929 Other chronic pain: Secondary | ICD-10-CM

## 2017-02-22 DIAGNOSIS — R262 Difficulty in walking, not elsewhere classified: Secondary | ICD-10-CM

## 2017-02-22 NOTE — Therapy (Signed)
[facility] [address], [place], [address] Phone: [phone]   Fax:  [phone]  Physical Therapy Treatment  Patient Details  Name: Aimee Gutierrez MRN: [id number] Date of Birth: 10/11/1933 Referring Provider: Fayrene Fearing Philmon Angie Fava., MD  Encounter Date: 02/22/2017      PT End of Session - 02/22/17 1520    Visit Number 6   Number of Visits 9   Date for PT Re-Evaluation 03/02/17   Authorization Type 6   Authorization Time Period of 10 gcode   PT Start Time 1520   PT Stop Time 1601   PT Time Calculation (min) 41 min   Activity Tolerance Patient tolerated treatment well   Behavior During Therapy [facility] for tasks assessed/performed      Past Medical History:  Diagnosis Date  . Arthritis   . Breast cancer ([facility]) 03/09/15   right breast, radiation  . Breast cancer ([facility]) 2013   left breast, radiation  . 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 ER 90%, PR greater than 50%, HER-2/neu not amplified. Axillary sentinel node and radiation deferred based on age..  . Cancer ([facility]) 2013   with radiation, stage 1, left breast  . Environmental allergies   . Hypothyroidism   . IBS (irritable bowel syndrome)    in the past, diverticulitis in the past.  . Osteoporosis     Past Surgical History:  Procedure Laterality Date  . BREAST BIOPSY Right 02/17/15   positive  . BREAST EXCISIONAL BIOPSY Left 2013   Positive  . BREAST SURGERY Left 2013   [facility]  . BREAST SURGERY Right 03/09/15   wide excision  . CATARACT EXTRACTION  2013, 2015  . OVARY SURGERY  2001   benign  . TOTAL KNEE ARTHROPLASTY Right 10/12/2016   Procedure: TOTAL KNEE ARTHROPLASTY;  Surgeon: Donato Heinz, MD;  Location: [facility] ORS;  Service: Orthopedics;  Laterality: Right;    There were no vitals filed for this visit.      Subjective Assessment - 02/22/17  1523    Subjective Both knees don't feel really good today. Feels like its related to the weather. 2-3/10 this morning.  Did a lot of walking yesterday (mamogram at hospital and walked at grocery store).    Pertinent History L knee pain. Gradual onset for a long time. Had injections in both knees for several years. Her most recent injection last summer 2017 (synvisc), did not help her L knee (used to help). Has not yet had PT for L knee. L knee pain worsened after having therapy for her R knee (using seated leg press).  Pt also states losing her balance when donning and doffing her pants due to her L knee.  Has to sit down to do so.   R TKA doing great.    Patient Stated Goals I'd like to be able to walk, go shopping (with less L knee pain).    Currently in Pain? Yes   Pain Score --  "not bad but it's there" (no pain level provided)   Pain Onset More than a month ago                                 PT Education - 02/22/17 1528    Education provided Yes   Education Details ther-ex   Person(s)  Educated Patient   Methods Explanation;Demonstration;Tactile cues;Verbal cues   Comprehension Returned demonstration;Verbalized understanding        Objectives  There-ex  Directed patient with supine L hip extension isometrics 10x2 with 5 second holds, L LE straight, R knee in hooklying.   Supine SLR L hip flexion with slight L hip ER 5x2 to promote VMO strength  L S/L R clamshell 4x. R posterior thigh and knee soreness  seated hip adductor ball (25 cm physioball) squeeze with glute max squeeze 10x5 seconds for 2 sets   Decreased R posterior thigh and knee soreness.    Seated L hip extension isometrics 10x5 seconds  seated L knee flexion resisting yellow band 10x3   Improved exercise technique, movement at target joints, use of target muscles after min to  mod verbal, visual, tactile cues.     No pain in L knee with gait after session. Continued working on  [employer], glute, and hamstring strengthening to promote proper positioning and control at L knee joint in standing closed chain positions.          PT Long Term Goals - 01/31/17 1921      PT LONG TERM GOAL #1   Title Patient will have a decrease in L knee pain to 4/10 or less L knee pain at worst to promote ability to ambulate longer distances, perform functional tasks.   Baseline 8/10 L knee pain at worst (01/31/2017)   Time 4   Period Weeks   Status New     PT LONG TERM GOAL #2   Title Patient will improve her 6 minute walk distance to at least 628 ft with [facility] to promote mobility.    Baseline 578 ft with SPC (01/31/2017)   Time 4   Period Weeks   Status New     PT LONG TERM GOAL #3   Title Patient will improve her LEFS score by at least 9 points as a demonstration of improved function.    Baseline 28/80 (01/31/2017)   Time 4   Period Weeks   Status New     PT LONG TERM GOAL #4   Title Patient will improve bilateral hip extension, and hip abduction strength by at least 1/2 MMT to promote ability to perform standing tasks.    Time 4   Period Weeks   Status New     PT LONG TERM GOAL #5   Title Patient will improve L knee flexion strength by at least 1/2 MMT grade to promote ability to ambulate with less L knee pain.   Time 4   Period Weeks   Status New               Plan - 02/22/17 1529    Clinical Impression Statement No pain in L knee with gait after session. Continued working on [employer], glute, and hamstring strengthening to promote proper positioning and control at L knee joint in standing closed chain positions.    Rehab Potential Good   Clinical Impairments Affecting Rehab Potential Chronicity of condition   PT Frequency 2x / week   PT Duration 4 weeks   PT Treatment/Interventions Therapeutic activities;Therapeutic exercise;Manual techniques;Dry needling;Patient/family education;Neuromuscular re-education;Ultrasound;Iontophoresis '4mg'$ /ml Dexamethasone;Electrical  Stimulation;Aquatic Therapy   PT Next Visit Plan manual therapy, hip and knee strengthening, pelvic control   Consulted and Agree with Plan of Care Patient      Patient will benefit from skilled therapeutic intervention in order to improve the following deficits and impairments:  Pain, Improper body  mechanics, Decreased strength, Difficulty walking  Visit Diagnosis: Chronic pain of left knee  Difficulty in walking, not elsewhere classified     Problem List Patient Active Problem List   Diagnosis Date Noted  . S/P total knee arthroplasty 10/12/2016  . Chronic ITP (idiopathic thrombocytopenic purpura) (HCC) 10/06/2016  . Carcinoma in situ, breast, ductal 08/03/2015  . Acid indigestion 07/02/2015  . Adult hypothyroidism 07/02/2015  . Arthritis, degenerative 07/02/2015  . Change in blood platelet count 07/02/2015  . Osteoporosis 06/04/2015  . Invasive ductal carcinoma of right breast, stage 1 ([place]) 03/13/2015  . Invasive ductal carcinoma of left breast, stage 1 ([place]) 02/26/2015    Joneen Boers PT, DPT  02/22/2017, 6:26 PM  [place] [facility] [address], [place], [address] Phone: [phone]   Fax:  [phone]  Name: Aimee Gutierrez MRN: [id number] Date of Birth: Jul 11, 1933

## 2017-02-23 ENCOUNTER — Encounter: Payer: Self-pay | Admitting: Hematology and Oncology

## 2017-02-23 ENCOUNTER — Inpatient Hospital Stay: Payer: Medicare Other

## 2017-02-23 ENCOUNTER — Inpatient Hospital Stay: Payer: Medicare Other | Attending: Hematology and Oncology | Admitting: Hematology and Oncology

## 2017-02-23 VITALS — BP 143/74 | HR 69 | Temp 96.4°F | Resp 18 | Wt 141.4 lb

## 2017-02-23 DIAGNOSIS — Z853 Personal history of malignant neoplasm of breast: Secondary | ICD-10-CM | POA: Insufficient documentation

## 2017-02-23 DIAGNOSIS — C50912 Malignant neoplasm of unspecified site of left female breast: Secondary | ICD-10-CM

## 2017-02-23 DIAGNOSIS — Z87891 Personal history of nicotine dependence: Secondary | ICD-10-CM | POA: Diagnosis not present

## 2017-02-23 DIAGNOSIS — Z803 Family history of malignant neoplasm of breast: Secondary | ICD-10-CM | POA: Insufficient documentation

## 2017-02-23 DIAGNOSIS — Z923 Personal history of irradiation: Secondary | ICD-10-CM | POA: Diagnosis not present

## 2017-02-23 DIAGNOSIS — K589 Irritable bowel syndrome without diarrhea: Secondary | ICD-10-CM | POA: Insufficient documentation

## 2017-02-23 DIAGNOSIS — R0602 Shortness of breath: Secondary | ICD-10-CM | POA: Insufficient documentation

## 2017-02-23 DIAGNOSIS — M818 Other osteoporosis without current pathological fracture: Secondary | ICD-10-CM

## 2017-02-23 DIAGNOSIS — D693 Immune thrombocytopenic purpura: Secondary | ICD-10-CM

## 2017-02-23 DIAGNOSIS — C50911 Malignant neoplasm of unspecified site of right female breast: Secondary | ICD-10-CM

## 2017-02-23 DIAGNOSIS — D696 Thrombocytopenia, unspecified: Secondary | ICD-10-CM | POA: Diagnosis not present

## 2017-02-23 DIAGNOSIS — Z79899 Other long term (current) drug therapy: Secondary | ICD-10-CM | POA: Insufficient documentation

## 2017-02-23 DIAGNOSIS — R42 Dizziness and giddiness: Secondary | ICD-10-CM

## 2017-02-23 DIAGNOSIS — E039 Hypothyroidism, unspecified: Secondary | ICD-10-CM | POA: Diagnosis not present

## 2017-02-23 DIAGNOSIS — M810 Age-related osteoporosis without current pathological fracture: Secondary | ICD-10-CM

## 2017-02-23 LAB — COMPREHENSIVE METABOLIC PANEL
ALT: 17 U/L (ref 14–54)
AST: 20 U/L (ref 15–41)
Albumin: 4.2 g/dL (ref 3.5–5.0)
Alkaline Phosphatase: 69 U/L (ref 38–126)
Anion gap: 7 (ref 5–15)
BUN: 16 mg/dL (ref 6–20)
CO2: 29 mmol/L (ref 22–32)
Calcium: 9.6 mg/dL (ref 8.9–10.3)
Chloride: 100 mmol/L — ABNORMAL LOW (ref 101–111)
Creatinine, Ser: 0.89 mg/dL (ref 0.44–1.00)
GFR calc Af Amer: >60 mL/min (ref >60)
GFR calc non Af Amer: 59 mL/min — ABNORMAL LOW (ref >60)
Glucose, Bld: 100 mg/dL — ABNORMAL HIGH (ref 65–99)
Potassium: 4.5 mmol/L (ref 3.5–5.1)
Sodium: 136 mmol/L (ref 135–145)
Total Bilirubin: 0.6 mg/dL (ref 0.3–1.2)
Total Protein: 7.6 g/dL (ref 6.5–8.1)

## 2017-02-23 LAB — CBC WITH DIFFERENTIAL/PLATELET
Basophils Absolute: 0.1 10*3/uL (ref 0–0.1)
Basophils Relative: 2 %
Eosinophils Absolute: 0.1 10*3/uL (ref 0–0.7)
Eosinophils Relative: 2 %
HCT: 38.3 % (ref 35.0–47.0)
Hemoglobin: 12.7 g/dL (ref 12.0–16.0)
Lymphocytes Relative: 19 %
Lymphs Abs: 0.9 10*3/uL — ABNORMAL LOW (ref 1.0–3.6)
MCH: 27.9 pg (ref 26.0–34.0)
MCHC: 33.3 g/dL (ref 32.0–36.0)
MCV: 84 fL (ref 80.0–100.0)
Monocytes Absolute: 0.4 10*3/uL (ref 0.2–0.9)
Monocytes Relative: 9 %
Neutro Abs: 3 10*3/uL (ref 1.4–6.5)
Neutrophils Relative %: 68 %
Platelets: 136 10*3/uL — ABNORMAL LOW (ref 150–440)
RBC: 4.56 MIL/uL (ref 3.80–5.20)
RDW: 18.2 % — ABNORMAL HIGH (ref 11.5–14.5)
WBC: 4.4 10*3/uL (ref 3.6–11.0)

## 2017-02-23 NOTE — Progress Notes (Signed)
Patient here today for follow up regarding DCIS.  States she sometimes gets SOB.  Spoke with Dr. Caryl Comes and he was not concerned.  Patient had knee surgery on 10-12-16.

## 2017-02-23 NOTE — Progress Notes (Signed)
[facility]-  Cancer Center  Clinic day:  02/23/17  Chief Complaint: Aimee Gutierrez is a 83 y.o. female with a history of bilateral breast cancer who is seen for 4 month assessment.  HPI: The patient was last seen in the medical oncology clinic on 10/06/2016.  At that time, she described a dizzy spell and episodic "fogginess" since the death of her son and husband.  Exam was stable.  Platelet count was 125,000.  Bilateral mammogram on 02/21/2017 revealed stable post lumpectomy changes bilaterally.  During the interm, she had knee surgery on 10/12/2016. Symptomatically, she feels "pretty well".  She has intermittent shortness of breath. She denies any chest pain.  She denies any breast concerns.   Past Medical History:  Diagnosis Date  . Arthritis   . Breast cancer (HCC) 03/09/15   right breast, radiation  . Breast cancer (HCC) 2013   left breast, radiation  . 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 ER 90%, PR greater than 50%, HER-2/neu not amplified. Axillary sentinel node and radiation deferred based on age..  . Cancer (HCC) 2013   with radiation, stage 1, left breast  . Environmental allergies   . Hypothyroidism   . IBS (irritable bowel syndrome)    in the past, diverticulitis in the past.  . Osteoporosis     Past Surgical History:  Procedure Laterality Date  . BREAST BIOPSY Right 02/17/15   positive  . BREAST EXCISIONAL BIOPSY Left 2013   Positive  . BREAST SURGERY Left 2013   [facility]  . BREAST SURGERY Right 03/09/15   wide excision  . CATARACT EXTRACTION  2013, 2015  . OVARY SURGERY  2001   benign  . TOTAL KNEE ARTHROPLASTY Right 10/12/2016   Procedure: TOTAL KNEE ARTHROPLASTY;  Surgeon: Donato Heinz, MD;  Location: [facility] ORS;  Service: Orthopedics;  Laterality: Right;    Family History  Problem Relation Age of Onset  . Arthritis Mother   . Heart disease  Father   . Alzheimer's disease Sister   . Breast cancer Paternal Grandmother     Social History:  reports that she quit smoking about 7 years ago. She has never used smokeless tobacco. She reports that she does not drink alcohol or use drugs.  He son died on 04-17-16 and her husband died on 01-May-2016.  The patient is alone today.  Allergies:  Allergies  Allergen Reactions  . Nitrofurantoin Nausea Only  . Latex Rash    Latex IgE was NEGATIVE (<0.10)  . Levothyroxine Nausea Only    Shaking, nausea, hypertension-can tolerate Synthroid  . Sulfa Antibiotics Rash    Current Medications: Current Outpatient Prescriptions  Medication Sig Dispense Refill  . calcium carbonate (TUMS EX) 750 MG chewable tablet Chew 2 tablets by mouth daily.    . Cholecalciferol (VITAMIN D3) 1000 UNITS CAPS Take 4,000 Units by mouth daily.     Marland Kitchen levothyroxine (SYNTHROID, LEVOTHROID) 88 MCG tablet Take 88 mcg by mouth daily.    . Probiotic Product (HEALTHY COLON PO) Take 1 capsule by mouth at bedtime.     . enoxaparin (LOVENOX) 40 MG/0.4ML injection Inject 0.4 mLs (40 mg total) into the skin daily. (Patient not taking: Reported on 01/31/2017) 14 Syringe 0  . oxyCODONE (OXY IR/ROXICODONE) 5 MG immediate release tablet Take 1-2 tablets (5-10 mg total) by mouth every 4 (four) hours as needed for severe pain or breakthrough pain. (Patient not  taking: Reported on 01/31/2017) 30 tablet 0  . phenazopyridine (PYRIDIUM) 100 MG tablet Take 100 mg by mouth 3 (three) times daily as needed for pain. Reported on 04/29/2016    . traMADol (ULTRAM) 50 MG tablet Take 1-2 tablets (50-100 mg total) by mouth every 4 (four) hours as needed for moderate pain. (Patient not taking: Reported on 01/31/2017) 60 tablet 0   No current facility-administered medications for this visit.     Review of Systems:  GENERAL:  Feels "pretty well". No fevers or sweats. Weight up 8 pounds. PERFORMANCE STATUS (ECOG):  1 HEENT:  No visual changes, runny  nose, sore throat, mouth sores or tenderness. Lungs: No shortness of breath or cough.  No hemoptysis. Cardiac:  No chest pain, palpitations, orthopnea, or PND. GI:  No nausea, vomiting, diarrhea, constipation, melena or hematochezia. GU:  No urgency, frequency, dysuria, or hematuria. Musculoskeletal:  Interval knee surgery.  No back pain.  No muscle tenderness. Extremities:  No arthritis pain in hands.  No swelling. Skin:  No rashes or skin changes. Neuro:  No headache, numbness or weakness, balance or coordination issues. Endocrine:  No diabetes.  Thyroid disease on Synthroid.  No hot flashes or night sweats. Psych:  No mood changes, depression or anxiety. Pain:  No focal pain. Review of systems:  All other systems reviewed and found to be negative.  Physical Exam: Blood pressure (!) 143/74, pulse 69, temperature (!) 96.4 F (35.8 C), resp. rate 18, weight 141 lb 6 oz (64.1 kg). GENERAL:  Well developed, well nourished, woman sitting comfortably in the exam room in no acute distress.  MENTAL STATUS:  Alert and oriented to person, place and time. HEAD:  Short gray hair.  Normocephalic, atraumatic, face symmetric, no Cushingoid features. EYES:  Hazel eyes.  Pupils equal round and reactive to light and accomodation.  No conjunctivitis or scleral icterus. ENT:  Oropharynx clear without lesion.  Tongue normal. Mucous membranes moist.  NECK:  No murmur appreciated. RESPIRATORY:  Clear to auscultation without rales, wheezes or rhonchi. CARDIOVASCULAR:  Regular rate and rhythm without murmur, rub or gallop. BREAST:  Right breast without masses, skin changes or nipple discharge.  Left breast without masses, skin changes or nipple discharge.  Inverted nipples (chronic). ABDOMEN:  Soft, non-tender, with active bowel sounds, and no hepatosplenomegaly.  No masses. SKIN:  No rashes, ulcers or lesions. EXTREMITIES: Arthritis changes in hands.  No edema, no skin discoloration or tenderness.  No palpable  cords. LYMPH NODES: No palpable cervical, supraclavicular, axillary or inguinal adenopathy  NEUROLOGICAL: Unremarkable. PSYCH:  Appropriate.   Appointment on 02/23/2017  Component Date Value Ref Range Status  . WBC 02/23/2017 4.4  3.6 - 11.0 K/uL Final  . RBC 02/23/2017 4.56  3.80 - 5.20 MIL/uL Final  . Hemoglobin 02/23/2017 12.7  12.0 - 16.0 g/dL Final  . HCT 02/23/2017 38.3  35.0 - 47.0 % Final  . MCV 02/23/2017 84.0  80.0 - 100.0 fL Final  . MCH 02/23/2017 27.9  26.0 - 34.0 pg Final  . MCHC 02/23/2017 33.3  32.0 - 36.0 g/dL Final  . RDW 02/23/2017 18.2* 11.5 - 14.5 % Final  . Platelets 02/23/2017 136* 150 - 440 K/uL Final  . Neutrophils Relative % 02/23/2017 68  % Final  . Neutro Abs 02/23/2017 3.0  1.4 - 6.5 K/uL Final  . Lymphocytes Relative 02/23/2017 19  % Final  . Lymphs Abs 02/23/2017 0.9* 1.0 - 3.6 K/uL Final  . Monocytes Relative 02/23/2017 9  % Final  .  Monocytes Absolute 02/23/2017 0.4  0.2 - 0.9 K/uL Final  . Eosinophils Relative 02/23/2017 2  % Final  . Eosinophils Absolute 02/23/2017 0.1  0 - 0.7 K/uL Final  . Basophils Relative 02/23/2017 2  % Final  . Basophils Absolute 02/23/2017 0.1  0 - 0.1 K/uL Final  . Sodium 02/23/2017 136  135 - 145 mmol/L Final  . Potassium 02/23/2017 4.5  3.5 - 5.1 mmol/L Final  . Chloride 02/23/2017 100* 101 - 111 mmol/L Final  . CO2 02/23/2017 29  22 - 32 mmol/L Final  . Glucose, Bld 02/23/2017 100* 65 - 99 mg/dL Final  . BUN 02/23/2017 16  6 - 20 mg/dL Final  . Creatinine, Ser 02/23/2017 0.89  0.44 - 1.00 mg/dL Final  . Calcium 02/23/2017 9.6  8.9 - 10.3 mg/dL Final  . Total Protein 02/23/2017 7.6  6.5 - 8.1 g/dL Final  . Albumin 02/23/2017 4.2  3.5 - 5.0 g/dL Final  . AST 02/23/2017 20  15 - 41 U/L Final  . ALT 02/23/2017 17  14 - 54 U/L Final  . Alkaline Phosphatase 02/23/2017 69  38 - 126 U/L Final  . Total Bilirubin 02/23/2017 0.6  0.3 - 1.2 mg/dL Final  . GFR calc non Af Amer 02/23/2017 59* >60 mL/min Final  . GFR calc Af  Amer 02/23/2017 >60  >60 mL/min Final   Comment: (NOTE) The eGFR has been calculated using the CKD EPI equation. This calculation has not been validated in all clinical situations. eGFR's persistently <60 mL/min signify possible Chronic Kidney Disease.   Georgiann Hahn gap 02/23/2017 7  5 - 15 Final    Assessment:  Aimee Gutierrez is a 83 y.o. female with a history of bilateral breast cancer and chronic mild thrombocytopenia..   She initially presented with stage I left breast cancer s/p lumpectomy and sentinel lymph node biopsy on 03/09/2012. Initial biopsy revealed a 7 mm lesion in the left breast. Pathology revealed a 1 mm focus of residual invasive mammry carcinoma with DCIS. Stage T1aN0M0. Tumor was ER/PR+. Her2/neu was not assessed.  She received radiation from 04/26/2012 to 06/20/2012. She declined hormonal therapy.   Mammogram on 02/13/2014 revealed no evidence of maligancy. Mammogram and ultrasound on 02/16/2015 revealed a 5 x 3 x 2 mm hypoechoic mass at the 4 o'clock position of the right breast. Core biopsy on 02/17/2015 revealed low grade DCIS, cribiform type. Estrogen receptor was positive (> 90%) and progesterone receptor positive (> 90%).   She underwent wide excision of the right breast on 03/09/2015. Pathology revealed a 3.5 mm focus of grade II invasive carcinoma with ductal carcinoma in situ. Margins were negative. No lymph nodes were removed. Tumor was ER positive (> 90%), PR positive (51-90%) and Her2/neu negative. Pathologic stage was T1aNxMx. She completed radiation on 05/13/2015.  She decided against hormonal therapy.  Bilateral mammogram on 02/21/2017 revealed stable post lumpectomy changes bilaterally.  CA27.29 has been followed: 24.0 on 04/16/2015, 19.2 on 08/03/2015, 11.8 on 11/10/2015, 14.3 on 04/29/2016, 12.2 on 10/06/2016, and 17.7 on 02/23/2017.  Bone density study on 05/25/2015 revealed osteoporosis (T score of -3.3 in the femur neck and -3.5 in the  forearm). She is taking calcium and vitamin D.  She is not interested in Prolia or Fosamax.  She has persistent thrombocytopenia c/w mild chronic ITP.  Platelet count has ranged between 96,000 - 161,000 without trend.  Symptomatically, she denies any breast concerns.  Exam is stable.  Platelet count is 136,000.  Plan: 1.  Labs today:  CBC with diff, CMP, CA27.29. 2.  Review bilateral mammogram on 02/21/2017. 3.  Schedule bone density study. 4.  Continue calcium and vitamin D for osteoporosis.   5.  Continue to monitor platelet count with no intervention. 6.  RTC after bone density for MD assessment and labs (CBC with diff, CMP, CA27.29).   Lequita Asal, MD  02/23/2017, 11:12 AM

## 2017-02-24 LAB — CANCER ANTIGEN 27.29: CA 27.29: 17.7 U/mL (ref 0.0–38.6)

## 2017-02-28 ENCOUNTER — Ambulatory Visit: Payer: Medicare Other

## 2017-03-09 ENCOUNTER — Ambulatory Visit: Payer: Medicare Other | Attending: Orthopedic Surgery

## 2017-03-09 DIAGNOSIS — M25562 Pain in left knee: Secondary | ICD-10-CM | POA: Insufficient documentation

## 2017-03-09 DIAGNOSIS — G8929 Other chronic pain: Secondary | ICD-10-CM | POA: Diagnosis present

## 2017-03-09 DIAGNOSIS — R262 Difficulty in walking, not elsewhere classified: Secondary | ICD-10-CM | POA: Insufficient documentation

## 2017-03-09 NOTE — Therapy (Signed)
[facility] [address] [place], [address] Phone: [phone]   Fax:  [phone]  Physical Therapy Treatment Progress Report  And Discharge Summary  Patient Details  Name: Aimee Gutierrez MRN: [id number] Date of Birth: 1933/05/11 Referring Provider: Jeneen Rinks Philmon Holley Bouche., MD  Encounter Date: 03/09/2017      PT End of Session - 03/09/17 0950    Visit Number 7   Number of Visits 10   Date for PT Re-Evaluation 03/09/17   Authorization Type 7   Authorization Time Period of 10 gcode   PT Start Time 0950   PT Stop Time 1034   PT Time Calculation (min) 44 min   Activity Tolerance Patient tolerated treatment well   Behavior During Therapy [facility] for tasks assessed/performed      Past Medical History:  Diagnosis Date  . Arthritis   . Breast cancer ([place]) 03/09/15   right breast, radiation  . Breast cancer ([place]) 2013   left breast, radiation  . Breast cancer of lower-outer quadrant of right female breast ([place]) 03/09/2015   3.5 mm invasive carcinoma, low-grade DCIS. ER 90%, PR greater than 50%, HER-2/neu not amplified. Axillary sentinel node and radiation deferred based on age..  . Cancer ([place]) 2013   with radiation, stage 1, left breast  . Environmental allergies   . Hypothyroidism   . IBS (irritable bowel syndrome)    in the past, diverticulitis in the past.  . Osteoporosis     Past Surgical History:  Procedure Laterality Date  . BREAST BIOPSY Right 02/17/15   positive  . BREAST EXCISIONAL BIOPSY Left 2013   Positive  . BREAST SURGERY Left 2013   [place] Right 03/09/15   wide excision  . CATARACT EXTRACTION  2013, 2015  . OVARY SURGERY  2001   benign  . TOTAL KNEE ARTHROPLASTY Right 10/12/2016   Procedure: TOTAL KNEE ARTHROPLASTY;  Surgeon: Dereck Leep, MD;  Location: [facility] ORS;  Service: Orthopedics;  Laterality: Right;    There were no vitals filed for this visit.       Subjective Assessment - 03/09/17 0951    Subjective Pt states that she had a couple of days when both legs and hips bothered her. Feels good today. Took one pain pill since last session. I know the weather has something to do with it.  Walked mostly in the house without the cane. I know I'm better, even though I have bad days.  7-8/10 at most for the past 7 days (when both hips and knees bothered her). Other than that 1-2/10 L knee at most.   Pt states that her L knee is better and that she is walking better.  Pt states that she feels like she can continue with her progress with her HEP. Has an appointment with her doctor in May.    Pertinent History L knee pain. Gradual onset for a long time. Had injections in both knees for several years. Her most recent injection last summer 2017 (synvisc), did not help her L knee (used to help). Has not yet had PT for L knee. L knee pain worsened after having therapy for her R knee (using seated leg press).  Pt also states losing her balance when donning and doffing her pants due to her L knee.  Has to sit down to do so.   R TKA doing great.    Patient Stated Goals I'd like to  be able to walk, go shopping (with less L knee pain).    Currently in Pain? No/denies   Pain Score 0-No pain   Pain Onset More than a month ago            [facility] PT Assessment - 03/09/17 0958      Observation/Other Assessments   Observations 4 min walk with SPC (unable to walk 6 min secondary to fatigue): 459 ft   Lower Extremity Functional Scale  40/80     Strength   Right Hip Extension 4-/5   Right Hip ABduction 4/5   Left Hip Extension 4-/5   Left Hip ABduction 4+/5   Left Knee Flexion 5/5                             PT Education - 03/09/17 0957    Education provided Yes   Education Details ther-ex   Person(s) Educated Patient   Methods Explanation;Demonstration;Tactile cues;Verbal cues   Comprehension Verbalized understanding;Returned  demonstration        Objectives  There-ex  Directed patient with seated manually resisted knee flexion, S/L hip abduction, prone hip extension 1x each way  Reviewed progress/current status with strength with pt   Gait x 4 min with SPC   459 ft. Had stop secondary to fatigue. 5/10 L knee pain which disappears quickly when sitting.  Pt was recommended to practice to gradually increase her endurance with walking at home. Pt verbalized understanding.   Reviewed seated L knee extension HEP 10x2    Supine SLR L hip flexion with slight L hip ER 8x to promote VMO strength. L posterior hip discomfort around the sciatic area  Supine single knee to chest on L 10 seconds x 2 to help decrease L posterior hip symptoms.   R posterior knee discomfort when pt attempted the single knee to chest for R LE   seated hip adductor ball (25 cm physioball) squeeze with glute max squeeze 10x5 seconds for 2 sets   No posterior knee or hip pain bilaterally with supine single knee to chest. No pain/symptoms afterwards.   Reviewed piriformis muscle and its effect on the sciatic nerve   Improved exercise technique, movement at target joints, use of target muscles after min to mod verbal, visual, tactile cues.    Pt demonstrates overall decreased L knee pain levels at worst, improved L glute max and L hamstring strength, improved ability to ambulate (per pt reports), and improved function (improved LEFS scores) since initial evaluation. Patient has made progress with physical therapy towards goals, is consistent with her HEP and feels like she can continue with her improvement with her exercises at home. Skilled physical therapy services discharged after today's PT recertification.          PT Long Term Goals - 03/09/17 1007      PT LONG TERM GOAL #1   Title Patient will have a decrease in L knee pain to 4/10 or less L knee pain at worst to promote ability to ambulate longer distances, perform  functional tasks.   Baseline 8/10 L knee pain at worst (01/31/2017); 7-8/10 at most when both hips and knees bothered her one time, other than that, 1-2/10 L knee pain at most for the past 7 days (03/09/2017)   Time 4   Period Weeks   Status Partially Met     PT LONG TERM GOAL #2   Title Patient will improve her 6  minute walk distance to at least 628 ft with [facility] to promote mobility.    Baseline 578 ft with SPC (01/31/2017); 459 ft with SPC but had to stop after 4 minutes due to fatigue (04-05-2017)   Time 4   Period Weeks   Status On-going     PT LONG TERM GOAL #3   Title Patient will improve her LEFS score by at least 9 points as a demonstration of improved function.    Baseline 28/80 (01/31/2017); 40/50 (04-05-17)   Time 4   Period Weeks   Status Achieved     PT LONG TERM GOAL #4   Title Patient will improve bilateral hip extension, and hip abduction strength by at least 1/2 MMT to promote ability to perform standing tasks.    Time 4   Period Weeks   Status Partially Met     PT LONG TERM GOAL #5   Title Patient will improve L knee flexion strength by at least 1/2 MMT grade to promote ability to ambulate with less L knee pain.   Time 4   Period Weeks   Status Achieved               Plan - Apr 05, 2017 1246    Clinical Impression Statement Pt demonstrates overall decreased L knee pain levels at worst, improved L glute max and L hamstring strength, improved ability to ambulate (per pt reports), and improved function (improved LEFS scores) since initial evaluation. Patient has made progress with physical therapy towards goals, is consistent with her HEP and feels like she can continue with her improvement with her exercises at home. Skilled physical therapy services discharged after today's PT recertification.    Rehab Potential Good   Clinical Impairments Affecting Rehab Potential Chronicity of condition   PT Treatment/Interventions Therapeutic activities;Therapeutic exercise;Manual  techniques;Patient/family education;Neuromuscular re-education   PT Next Visit Plan Continue progress with HEP.    Consulted and Agree with Plan of Care Patient      Patient will benefit from skilled therapeutic intervention in order to improve the following deficits and impairments:  Pain, Improper body mechanics, Decreased strength, Difficulty walking  Visit Diagnosis: Chronic pain of left knee - Plan: PT plan of care cert/re-cert  Difficulty in walking, not elsewhere classified - Plan: PT plan of care cert/re-cert       G-Codes - 2017-04-05 1256    Functional Assessment Tool Used (Outpatient Only) LEFS, clinical presentation, patient interview   Functional Limitation Mobility: Walking and moving around   Mobility: Walking and Moving Around Current Status (P1156) --   Mobility: Walking and Moving Around Goal Status [phone]) At least 20 percent but less than 40 percent impaired, limited or restricted   Mobility: Walking and Moving Around Discharge Status [phone]) At least 40 percent but less than 60 percent impaired, limited or restricted      Problem List Patient Active Problem List   Diagnosis Date Noted  . S/P total knee arthroplasty 10/12/2016  . Chronic ITP (idiopathic thrombocytopenic purpura) (HCC) 10/06/2016  . Carcinoma in situ, breast, ductal 08/03/2015  . Acid indigestion 07/02/2015  . Adult hypothyroidism 07/02/2015  . Arthritis, degenerative 07/02/2015  . Change in blood platelet count 07/02/2015  . Osteoporosis 06/04/2015  . Invasive ductal carcinoma of right breast, stage 1 (HCC) 03/13/2015  . Invasive ductal carcinoma of left breast, stage 1 (HCC) 02/26/2015    Thank you for your referral.  Loralyn Freshwater PT, DPT   04/05/17, 1:04 PM  [facility] [address], [place], [address] Phone: [phone]   Fax:  [phone]  Name: SEVANA GRANDINETTI MRN: [id number] Date of Birth: Apr 08, 1933

## 2017-03-15 ENCOUNTER — Ambulatory Visit: Payer: Medicare Other | Admitting: Radiation Oncology

## 2017-04-10 ENCOUNTER — Encounter: Payer: Self-pay | Admitting: Hematology and Oncology

## 2017-05-05 ENCOUNTER — Ambulatory Visit
Admission: RE | Admit: 2017-05-05 | Discharge: 2017-05-05 | Disposition: A | Discharge status: Home / Self Care | Payer: Medicare Other | Source: Ambulatory Visit | Attending: Physician Assistant | Admitting: Physician Assistant

## 2017-05-05 ENCOUNTER — Other Ambulatory Visit: Payer: Self-pay | Admitting: Physician Assistant

## 2017-05-05 DIAGNOSIS — R0789 Other chest pain: Secondary | ICD-10-CM | POA: Insufficient documentation

## 2017-05-05 DIAGNOSIS — I700 Atherosclerosis of aorta: Secondary | ICD-10-CM | POA: Insufficient documentation

## 2017-05-05 DIAGNOSIS — I2510 Atherosclerotic heart disease of native coronary artery without angina pectoris: Secondary | ICD-10-CM | POA: Diagnosis not present

## 2017-05-05 DIAGNOSIS — R0602 Shortness of breath: Secondary | ICD-10-CM | POA: Diagnosis present

## 2017-05-05 LAB — POCT I-STAT CREATININE: Creatinine, Ser: 0.9 mg/dL (ref 0.44–1.00)

## 2017-05-05 MED ORDER — IOPAMIDOL (ISOVUE-370) INJECTION 76%
75.0000 mL | Freq: Once | INTRAVENOUS | Status: AC | PRN
  Administered 2017-05-05: 75 mL via INTRAVENOUS

## 2017-05-25 ENCOUNTER — Ambulatory Visit
Admission: RE | Admit: 2017-05-25 | Discharge: 2017-05-25 | Disposition: A | Discharge status: Home / Self Care | Payer: Medicare Other | Source: Ambulatory Visit | Attending: Hematology and Oncology | Admitting: Hematology and Oncology

## 2017-05-25 ENCOUNTER — Telehealth: Payer: Self-pay | Admitting: *Deleted

## 2017-05-25 DIAGNOSIS — Z780 Asymptomatic menopausal state: Secondary | ICD-10-CM | POA: Diagnosis not present

## 2017-05-25 DIAGNOSIS — C50912 Malignant neoplasm of unspecified site of left female breast: Secondary | ICD-10-CM | POA: Diagnosis not present

## 2017-05-25 DIAGNOSIS — C50911 Malignant neoplasm of unspecified site of right female breast: Secondary | ICD-10-CM | POA: Insufficient documentation

## 2017-05-25 DIAGNOSIS — M4186 Other forms of scoliosis, lumbar region: Secondary | ICD-10-CM | POA: Diagnosis not present

## 2017-05-25 DIAGNOSIS — E039 Hypothyroidism, unspecified: Secondary | ICD-10-CM | POA: Insufficient documentation

## 2017-05-25 DIAGNOSIS — Z923 Personal history of irradiation: Secondary | ICD-10-CM | POA: Diagnosis not present

## 2017-05-25 DIAGNOSIS — M810 Age-related osteoporosis without current pathological fracture: Secondary | ICD-10-CM | POA: Diagnosis not present

## 2017-05-25 NOTE — Telephone Encounter (Signed)
-----   Message from Lequita Asal, MD sent at 05/25/2017  2:43 PM EDT ----- Regarding: Please call patient  Bone density has decreased slightly.  Osteoporosis.  M  ----- Message ----- From: Interface, Rad Results In Sent: 05/25/2017  11:49 AM To: Lequita Asal, MD

## 2017-05-25 NOTE — Telephone Encounter (Signed)
Called patient to inform her that her bone density is improved.

## 2017-05-26 ENCOUNTER — Inpatient Hospital Stay: Payer: Medicare Other | Attending: Hematology and Oncology

## 2017-05-26 ENCOUNTER — Inpatient Hospital Stay (HOSPITAL_BASED_OUTPATIENT_CLINIC_OR_DEPARTMENT_OTHER): Payer: Medicare Other | Admitting: Hematology and Oncology

## 2017-05-26 ENCOUNTER — Encounter: Payer: Self-pay | Admitting: Hematology and Oncology

## 2017-05-26 VITALS — BP 129/85 | HR 76 | Temp 98.8°F | Resp 18 | Wt 141.5 lb

## 2017-05-26 DIAGNOSIS — Z87891 Personal history of nicotine dependence: Secondary | ICD-10-CM

## 2017-05-26 DIAGNOSIS — E039 Hypothyroidism, unspecified: Secondary | ICD-10-CM

## 2017-05-26 DIAGNOSIS — D696 Thrombocytopenia, unspecified: Secondary | ICD-10-CM | POA: Insufficient documentation

## 2017-05-26 DIAGNOSIS — C50911 Malignant neoplasm of unspecified site of right female breast: Secondary | ICD-10-CM

## 2017-05-26 DIAGNOSIS — K589 Irritable bowel syndrome without diarrhea: Secondary | ICD-10-CM | POA: Diagnosis not present

## 2017-05-26 DIAGNOSIS — Z803 Family history of malignant neoplasm of breast: Secondary | ICD-10-CM | POA: Insufficient documentation

## 2017-05-26 DIAGNOSIS — Z79899 Other long term (current) drug therapy: Secondary | ICD-10-CM

## 2017-05-26 DIAGNOSIS — D693 Immune thrombocytopenic purpura: Secondary | ICD-10-CM

## 2017-05-26 DIAGNOSIS — Z923 Personal history of irradiation: Secondary | ICD-10-CM | POA: Insufficient documentation

## 2017-05-26 DIAGNOSIS — Z853 Personal history of malignant neoplasm of breast: Secondary | ICD-10-CM | POA: Diagnosis present

## 2017-05-26 DIAGNOSIS — C50912 Malignant neoplasm of unspecified site of left female breast: Secondary | ICD-10-CM

## 2017-05-26 DIAGNOSIS — M810 Age-related osteoporosis without current pathological fracture: Secondary | ICD-10-CM

## 2017-05-26 LAB — CBC WITH DIFFERENTIAL/PLATELET
Basophils Absolute: 0.1 10*3/uL (ref 0–0.1)
Basophils Relative: 1 %
Eosinophils Absolute: 0.1 10*3/uL (ref 0–0.7)
Eosinophils Relative: 2 %
HCT: 41.3 % (ref 35.0–47.0)
Hemoglobin: 14 g/dL (ref 12.0–16.0)
Lymphocytes Relative: 15 %
Lymphs Abs: 0.8 10*3/uL — ABNORMAL LOW (ref 1.0–3.6)
MCH: 29.2 pg (ref 26.0–34.0)
MCHC: 33.8 g/dL (ref 32.0–36.0)
MCV: 86.2 fL (ref 80.0–100.0)
Monocytes Absolute: 0.3 10*3/uL (ref 0.2–0.9)
Monocytes Relative: 6 %
Neutro Abs: 4.2 10*3/uL (ref 1.4–6.5)
Neutrophils Relative %: 76 %
Platelets: 135 10*3/uL — ABNORMAL LOW (ref 150–440)
RBC: 4.79 MIL/uL (ref 3.80–5.20)
RDW: 16 % — ABNORMAL HIGH (ref 11.5–14.5)
WBC: 5.5 10*3/uL (ref 3.6–11.0)

## 2017-05-26 LAB — COMPREHENSIVE METABOLIC PANEL
ALT: 13 U/L — ABNORMAL LOW (ref 14–54)
AST: 17 U/L (ref 15–41)
Albumin: 4.3 g/dL (ref 3.5–5.0)
Alkaline Phosphatase: 63 U/L (ref 38–126)
Anion gap: 7 (ref 5–15)
BUN: 20 mg/dL (ref 6–20)
CO2: 30 mmol/L (ref 22–32)
Calcium: 9.8 mg/dL (ref 8.9–10.3)
Chloride: 98 mmol/L — ABNORMAL LOW (ref 101–111)
Creatinine, Ser: 0.94 mg/dL (ref 0.44–1.00)
GFR calc Af Amer: >60 mL/min (ref >60)
GFR calc non Af Amer: 55 mL/min — ABNORMAL LOW (ref >60)
Glucose, Bld: 133 mg/dL — ABNORMAL HIGH (ref 65–99)
Potassium: 4 mmol/L (ref 3.5–5.1)
Sodium: 135 mmol/L (ref 135–145)
Total Bilirubin: 0.8 mg/dL (ref 0.3–1.2)
Total Protein: 7.4 g/dL (ref 6.5–8.1)

## 2017-05-26 NOTE — Progress Notes (Signed)
Patient offers no complaints today.  Patient here today regarding bone density results.

## 2017-05-26 NOTE — Progress Notes (Signed)
[facility] day:  05/26/17  Chief Complaint: Aimee Gutierrez is a 83 y.o. female with a history of bilateral breast cancer who is seen for review of bone density and 3 month assessment.  HPI: The patient was last seen in the medical oncology clinic on 02/03/2017.  At that time, she denied any breast concerns.  Exam was stable.  Platelet count was136,000.  Bone density on 05/25/2017 revealed osteoporosis with a T-score of -3.4 in the left femoral neck and -3.4 in the left forearm radius.  Since patient's last visit she reports that she is feeling "pretty good". She denies any breast concerns.  Patient reports frequent "stomach aches"  with certain foods; reports that lettuce really bothers her. Patient plans to discuss this with Dr. Caryl Comes.    Past Medical History:  Diagnosis Date  . Arthritis   . Breast cancer ([place]) 03/09/15   right breast, radiation  . Breast cancer ([place]) 2013   left breast, radiation  . Breast cancer of lower-outer quadrant of right female breast ([place]) 03/09/2015   3.5 mm invasive carcinoma, low-grade DCIS. ER 90%, PR greater than 50%, HER-2/neu not amplified. Axillary sentinel node and radiation deferred based on age..  . Cancer ([place]) 2013   with radiation, stage 1, left breast  . Environmental allergies   . Hypothyroidism   . IBS (irritable bowel syndrome)    in the past, diverticulitis in the past.  . Osteoporosis     Past Surgical History:  Procedure Laterality Date  . BREAST BIOPSY Right 02/17/15   positive  . BREAST EXCISIONAL BIOPSY Left 2013   Positive  . BREAST SURGERY Left 2013   [place] Right 03/09/15   wide excision  . CATARACT EXTRACTION  2013, 2015  . OVARY SURGERY  2001   benign  . TOTAL KNEE ARTHROPLASTY Right 10/12/2016   Procedure: TOTAL KNEE ARTHROPLASTY;  Surgeon: Dereck Leep, MD;  Location: [facility] ORS;  Service: Orthopedics;  Laterality: Right;    Family History   Problem Relation Age of Onset  . Arthritis Mother   . Heart disease Father   . Alzheimer's disease Sister   . Breast cancer Paternal Grandmother     Social History:  reports that she quit smoking about 7 years ago. She has never used smokeless tobacco. She reports that she does not drink alcohol or use drugs.  He son died on 03/24/16 and her husband died on 04/07/2016.  The patient is alone today.  Allergies:  Allergies  Allergen Reactions  . Nitrofurantoin Nausea Only  . Latex Rash    Latex IgE was NEGATIVE (<0.10)  . Levothyroxine Nausea Only    Shaking, nausea, hypertension-can tolerate Synthroid  . Sulfa Antibiotics Rash    Current Medications: Current Outpatient Prescriptions  Medication Sig Dispense Refill  . calcium carbonate (TUMS EX) 750 MG chewable tablet Chew 2 tablets by mouth daily.    . Cholecalciferol (VITAMIN D3) 1000 UNITS CAPS Take 4,000 Units by mouth daily.     Marland Kitchen levothyroxine (SYNTHROID, LEVOTHROID) 88 MCG tablet Take 88 mcg by mouth daily.    . phenazopyridine (PYRIDIUM) 100 MG tablet Take 100 mg by mouth 3 (three) times daily as needed for pain. Reported on 04/29/2016    . enoxaparin (LOVENOX) 40 MG/0.4ML injection Inject 0.4 mLs (40 mg total) into the skin daily. (Patient not taking: Reported on 01/31/2017) 14 Syringe 0  . oxyCODONE (OXY IR/ROXICODONE) 5 MG immediate  release tablet Take 1-2 tablets (5-10 mg total) by mouth every 4 (four) hours as needed for severe pain or breakthrough pain. (Patient not taking: Reported on 01/31/2017) 30 tablet 0  . Probiotic Product (HEALTHY COLON PO) Take 1 capsule by mouth at bedtime.     . traMADol (ULTRAM) 50 MG tablet Take 1-2 tablets (50-100 mg total) by mouth every 4 (four) hours as needed for moderate pain. (Patient not taking: Reported on 01/31/2017) 60 tablet 0   No current facility-administered medications for this visit.     Review of Systems:  GENERAL:  Feels "pretty good".. No fevers or sweats. Weight  stable. PERFORMANCE STATUS (ECOG):  1 HEENT:  No visual changes, runny nose, sore throat, mouth sores or tenderness. Lungs: No shortness of breath or cough.  No hemoptysis. Cardiac:  No chest pain, palpitations, orthopnea, or PND. GI:  No nausea, vomiting, diarrhea, constipation, melena or hematochezia. (+) frequent "stomach aches" with certain foods.  GU:  No urgency, frequency, dysuria, or hematuria. Musculoskeletal:  Interval knee surgery.  No back pain.  No muscle tenderness. Extremities:  No arthritis pain in hands.  No swelling. Skin:  No rashes or skin changes. Neuro:  No headache, numbness or weakness, balance or coordination issues. Endocrine:  No diabetes.  Thyroid disease on Synthroid.  No hot flashes or night sweats. Psych:  No mood changes, depression or anxiety. Pain:  No focal pain. Review of systems:  All other systems reviewed and found to be negative.  Physical Exam: Blood pressure 129/85, pulse 76, temperature 98.8 F (37.1 C), temperature source Tympanic, resp. rate 18, weight 141 lb 8 oz (64.2 kg). GENERAL:  Well developed, well nourished, woman sitting comfortably in the exam room in no acute distress.  MENTAL STATUS:  Alert and oriented to person, place and time. HEAD:  Short gray hair.  Normocephalic, atraumatic, face symmetric, no Cushingoid features. EYES:  Hazel eyes.  Pupils equal round and reactive to light and accomodation.  No conjunctivitis or scleral icterus. ENT:  Oropharynx clear without lesion.  Tongue normal. Mucous membranes moist.  NECK:  No murmur appreciated. RESPIRATORY:  Clear to auscultation without rales, wheezes or rhonchi. CARDIOVASCULAR:  Regular rate and rhythm without murmur, rub or gallop. BREAST:  Right breast without masses, skin changes or nipple discharge.  Left breast without masses, skin changes or nipple discharge.  Inverted nipples (chronic). ABDOMEN:  Soft, non-tender, with active bowel sounds, and no hepatosplenomegaly.  No  masses. SKIN:  No rashes, ulcers or lesions. EXTREMITIES: Arthritis changes in hands.  No edema, no skin discoloration or tenderness.  No palpable cords. LYMPH NODES: No palpable cervical, supraclavicular, axillary or inguinal adenopathy  NEUROLOGICAL: Unremarkable. PSYCH:  Appropriate.   Appointment on 05/26/2017  Component Date Value Ref Range Status  . Sodium 05/26/2017 135  135 - 145 mmol/L Final  . Potassium 05/26/2017 4.0  3.5 - 5.1 mmol/L Final  . Chloride 05/26/2017 98* 101 - 111 mmol/L Final  . CO2 05/26/2017 30  22 - 32 mmol/L Final  . Glucose, Bld 05/26/2017 133* 65 - 99 mg/dL Final  . BUN 05/26/2017 20  6 - 20 mg/dL Final  . Creatinine, Ser 05/26/2017 0.94  0.44 - 1.00 mg/dL Final  . Calcium 05/26/2017 9.8  8.9 - 10.3 mg/dL Final  . Total Protein 05/26/2017 7.4  6.5 - 8.1 g/dL Final  . Albumin 05/26/2017 4.3  3.5 - 5.0 g/dL Final  . AST 05/26/2017 17  15 - 41 U/L Final  . ALT  05/26/2017 13* 14 - 54 U/L Final  . Alkaline Phosphatase 05/26/2017 63  38 - 126 U/L Final  . Total Bilirubin 05/26/2017 0.8  0.3 - 1.2 mg/dL Final  . GFR calc non Af Amer 05/26/2017 55* >60 mL/min Final  . GFR calc Af Amer 05/26/2017 >60  >60 mL/min Final   Comment: (NOTE) The eGFR has been calculated using the CKD EPI equation. This calculation has not been validated in all clinical situations. eGFR's persistently <60 mL/min signify possible Chronic Kidney Disease.   . Anion gap 05/26/2017 7  5 - 15 Final  . WBC 05/26/2017 5.5  3.6 - 11.0 K/uL Final  . RBC 05/26/2017 4.79  3.80 - 5.20 MIL/uL Final  . Hemoglobin 05/26/2017 14.0  12.0 - 16.0 g/dL Final  . HCT 05/26/2017 41.3  35.0 - 47.0 % Final  . MCV 05/26/2017 86.2  80.0 - 100.0 fL Final  . MCH 05/26/2017 29.2  26.0 - 34.0 pg Final  . MCHC 05/26/2017 33.8  32.0 - 36.0 g/dL Final  . RDW 05/26/2017 16.0* 11.5 - 14.5 % Final  . Platelets 05/26/2017 135* 150 - 440 K/uL Final  . Neutrophils Relative % 05/26/2017 76  % Final  . Neutro Abs  05/26/2017 4.2  1.4 - 6.5 K/uL Final  . Lymphocytes Relative 05/26/2017 15  % Final  . Lymphs Abs 05/26/2017 0.8* 1.0 - 3.6 K/uL Final  . Monocytes Relative 05/26/2017 6  % Final  . Monocytes Absolute 05/26/2017 0.3  0.2 - 0.9 K/uL Final  . Eosinophils Relative 05/26/2017 2  % Final  . Eosinophils Absolute 05/26/2017 0.1  0 - 0.7 K/uL Final  . Basophils Relative 05/26/2017 1  % Final  . Basophils Absolute 05/26/2017 0.1  0 - 0.1 K/uL Final    Assessment:  CHEMERE STEFFLER is a 83 y.o. female with a history of bilateral breast cancer and chronic mild thrombocytopenia..   She initially presented with stage I left breast cancer s/p lumpectomy and sentinel lymph node biopsy on 03/09/2012. Initial biopsy revealed a 7 mm lesion in the left breast. Pathology revealed a 1 mm focus of residual invasive mammry carcinoma with DCIS. Stage T1aN0M0. Tumor was ER/PR+. Her2/neu was not assessed.  She received radiation from 04/26/2012 to 06/20/2012. She declined hormonal therapy.   Mammogram on 02/13/2014 revealed no evidence of maligancy. Mammogram and ultrasound on 02/16/2015 revealed a 5 x 3 x 2 mm hypoechoic mass at the 4 o'clock position of the right breast. Core biopsy on 02/17/2015 revealed low grade DCIS, cribiform type. Estrogen receptor was positive (> 90%) and progesterone receptor positive (> 90%).   She underwent wide excision of the right breast on 03/09/2015. Pathology revealed a 3.5 mm focus of grade II invasive carcinoma with ductal carcinoma in situ. Margins were negative. No lymph nodes were removed. Tumor was ER positive (> 90%), PR positive (51-90%) and Her2/neu negative. Pathologic stage was T1aNxMx. She completed radiation on 05/13/2015.  She decided against hormonal therapy.  Bilateral mammogram on 02/21/2017 revealed stable post lumpectomy changes bilaterally.  CA27.29 has been followed: 24.0 on 04/16/2015, 19.2 on 08/03/2015, 11.8 on 11/10/2015, 14.3 on 04/29/2016, 12.2  on 10/06/2016, and 17.7 on 02/23/2017.  Bone density study on 05/25/2015 revealed osteoporosis (T score of -3.3 in the femur neck and -3.5 in the forearm). She is taking calcium and vitamin D.  She is not interested in Prolia or Fosamax; readdressed today and patient remains adamant that she does not wish to taking anything.  She has persistent thrombocytopenia c/w mild chronic ITP.  Platelet count has ranged between 96,000 - 161,000 without trend. Platelet count 135,000  today.   Symptomatically, she denies any breast concerns.  She has frequent stomach aches with certain foods. Exam is stable.    Plan: 1.  Labs today:  CBC with diff, CMP, CA27.29. 2.  Review bone density study.  Patient declines any medication for treatment. 3.  Continue calcium and vitamin D for osteoporosis.    4.  RTC in 4 months for MD assessment and labs (CBC with diff, CMP, CA27.29) 5.  Continue to monitor platelet count with no intervention.   Lequita Asal, MD  05/26/2017, 11:25 AM

## 2017-05-27 LAB — CANCER ANTIGEN 27.29: CA 27.29: 13.1 U/mL (ref 0.0–38.6)

## 2017-05-28 DIAGNOSIS — M1712 Unilateral primary osteoarthritis, left knee: Secondary | ICD-10-CM | POA: Insufficient documentation

## 2017-10-13 ENCOUNTER — Inpatient Hospital Stay: Payer: Medicare Other | Attending: Hematology and Oncology

## 2017-10-13 ENCOUNTER — Other Ambulatory Visit: Payer: Self-pay | Admitting: *Deleted

## 2017-10-13 ENCOUNTER — Encounter: Payer: Self-pay | Admitting: Hematology and Oncology

## 2017-10-13 ENCOUNTER — Inpatient Hospital Stay (HOSPITAL_BASED_OUTPATIENT_CLINIC_OR_DEPARTMENT_OTHER): Payer: Medicare Other | Admitting: Hematology and Oncology

## 2017-10-13 ENCOUNTER — Other Ambulatory Visit: Payer: Self-pay

## 2017-10-13 VITALS — BP 119/76 | HR 87 | Temp 96.9°F | Wt 143.1 lb

## 2017-10-13 DIAGNOSIS — C50911 Malignant neoplasm of unspecified site of right female breast: Secondary | ICD-10-CM

## 2017-10-13 DIAGNOSIS — Z803 Family history of malignant neoplasm of breast: Secondary | ICD-10-CM | POA: Diagnosis not present

## 2017-10-13 DIAGNOSIS — M810 Age-related osteoporosis without current pathological fracture: Secondary | ICD-10-CM | POA: Diagnosis not present

## 2017-10-13 DIAGNOSIS — K589 Irritable bowel syndrome without diarrhea: Secondary | ICD-10-CM | POA: Insufficient documentation

## 2017-10-13 DIAGNOSIS — Z853 Personal history of malignant neoplasm of breast: Secondary | ICD-10-CM

## 2017-10-13 DIAGNOSIS — M1990 Unspecified osteoarthritis, unspecified site: Secondary | ICD-10-CM | POA: Diagnosis not present

## 2017-10-13 DIAGNOSIS — Z923 Personal history of irradiation: Secondary | ICD-10-CM | POA: Insufficient documentation

## 2017-10-13 DIAGNOSIS — Z87891 Personal history of nicotine dependence: Secondary | ICD-10-CM | POA: Diagnosis not present

## 2017-10-13 DIAGNOSIS — Z86000 Personal history of in-situ neoplasm of breast: Secondary | ICD-10-CM | POA: Diagnosis present

## 2017-10-13 DIAGNOSIS — R109 Unspecified abdominal pain: Secondary | ICD-10-CM

## 2017-10-13 DIAGNOSIS — Z79899 Other long term (current) drug therapy: Secondary | ICD-10-CM | POA: Diagnosis not present

## 2017-10-13 DIAGNOSIS — C50912 Malignant neoplasm of unspecified site of left female breast: Secondary | ICD-10-CM

## 2017-10-13 DIAGNOSIS — E039 Hypothyroidism, unspecified: Secondary | ICD-10-CM

## 2017-10-13 DIAGNOSIS — D693 Immune thrombocytopenic purpura: Secondary | ICD-10-CM | POA: Diagnosis not present

## 2017-10-13 DIAGNOSIS — Z170 Estrogen receptor positive status [ER+]: Secondary | ICD-10-CM | POA: Insufficient documentation

## 2017-10-13 LAB — COMPREHENSIVE METABOLIC PANEL
ALT: 14 U/L (ref 14–54)
AST: 18 U/L (ref 15–41)
Albumin: 4.3 g/dL (ref 3.5–5.0)
Alkaline Phosphatase: 73 U/L (ref 38–126)
Anion gap: 9 (ref 5–15)
BUN: 22 mg/dL — ABNORMAL HIGH (ref 6–20)
CO2: 25 mmol/L (ref 22–32)
Calcium: 9.5 mg/dL (ref 8.9–10.3)
Chloride: 100 mmol/L — ABNORMAL LOW (ref 101–111)
Creatinine, Ser: 0.85 mg/dL (ref 0.44–1.00)
GFR calc Af Amer: >60 mL/min (ref >60)
GFR calc non Af Amer: >60 mL/min (ref >60)
Glucose, Bld: 105 mg/dL — ABNORMAL HIGH (ref 65–99)
Potassium: 4.4 mmol/L (ref 3.5–5.1)
Sodium: 134 mmol/L — ABNORMAL LOW (ref 135–145)
Total Bilirubin: 0.4 mg/dL (ref 0.3–1.2)
Total Protein: 7.3 g/dL (ref 6.5–8.1)

## 2017-10-13 LAB — CBC WITH DIFFERENTIAL/PLATELET
Basophils Absolute: 0 10*3/uL (ref 0–0.1)
Basophils Relative: 0 %
Eosinophils Absolute: 0.1 10*3/uL (ref 0–0.7)
Eosinophils Relative: 3 %
HCT: 39.1 % (ref 35.0–47.0)
Hemoglobin: 13.2 g/dL (ref 12.0–16.0)
Lymphocytes Relative: 20 %
Lymphs Abs: 1.2 10*3/uL (ref 1.0–3.6)
MCH: 30.2 pg (ref 26.0–34.0)
MCHC: 33.7 g/dL (ref 32.0–36.0)
MCV: 89.5 fL (ref 80.0–100.0)
Monocytes Absolute: 0.4 10*3/uL (ref 0.2–0.9)
Monocytes Relative: 7 %
Neutro Abs: 3.9 10*3/uL (ref 1.4–6.5)
Neutrophils Relative %: 70 %
Platelets: 139 10*3/uL — ABNORMAL LOW (ref 150–440)
RBC: 4.37 MIL/uL (ref 3.80–5.20)
RDW: 14.6 % — ABNORMAL HIGH (ref 11.5–14.5)
WBC: 5.7 10*3/uL (ref 3.6–11.0)

## 2017-10-13 NOTE — Progress Notes (Signed)
[facility] day:  10/13/17  Chief Complaint: Aimee Gutierrez is a 83 y.o. female with a history of bilateral breast cancer who is seen for review of bone density study and 4 month assessment.  HPI: The patient was last seen in the medical oncology clinic on 05/26/2017.  At that time, she denied any breast concerns.  She had frequent stomach aches with certain foods. Exam was stable.  Platelet count was 135,00.  CA27.29 was normal.  She was not interested in hormonal therapy or treatment of osteoporosis.  Bone density on 05/25/2017 revealed osteoporosis with a T-score of -3.4 in the left femoral neck and -3.4 in the left forearm radius.  During the interim, patient notes that she is doing well. Patient reporting that she has a "roll" in her abdomen that is getting bigger and bigger. Patient also concerned because her breasts are full. She states, "They are so full that I am aware". Patient has no other physical concerns. She has not experienced any B symptoms or interval infections. Weight remains stable.    Past Medical History:  Diagnosis Date  . Arthritis   . Breast cancer ([place]) 03/09/15   right breast, radiation  . Breast cancer ([place]) 2013   left breast, radiation  . Breast cancer of lower-outer quadrant of right female breast ([place]) 03/09/2015   3.5 mm invasive carcinoma, low-grade DCIS. ER 90%, PR greater than 50%, HER-2/neu not amplified. Axillary sentinel node and radiation deferred based on age..  . Cancer ([place]) 2013   with radiation, stage 1, left breast  . Environmental allergies   . Hypothyroidism   . IBS (irritable bowel syndrome)    in the past, diverticulitis in the past.  . Osteoporosis     Past Surgical History:  Procedure Laterality Date  . BREAST BIOPSY Right 02/17/15   positive  . BREAST EXCISIONAL BIOPSY Left 2013   Positive  . BREAST SURGERY Left 2013   [place] Right 03/09/15   wide excision  .  CATARACT EXTRACTION  2013, 2015  . OVARY SURGERY  2001   benign    Family History  Problem Relation Age of Onset  . Arthritis Mother   . Heart disease Father   . Alzheimer's disease Sister   . Breast cancer Paternal Grandmother     Social History:  reports that she quit smoking about 7 years ago. she has never used smokeless tobacco. She reports that she does not drink alcohol or use drugs.  He son died on Apr 15, 2016 and her husband died on 29-Apr-2016.  The patient is alone today.  Allergies:  Allergies  Allergen Reactions  . Nitrofurantoin Nausea Only  . Latex Rash    Latex IgE was NEGATIVE (<0.10)  . Levothyroxine Nausea Only    Shaking, nausea, hypertension-can tolerate Synthroid  . Sulfa Antibiotics Rash    Current Medications: Current Outpatient Medications  Medication Sig Dispense Refill  . calcium carbonate (TUMS EX) 750 MG chewable tablet Chew 2 tablets by mouth daily.    . Cholecalciferol (VITAMIN D3) 1000 UNITS CAPS Take 4,000 Units by mouth daily.     Marland Kitchen levothyroxine (SYNTHROID, LEVOTHROID) 88 MCG tablet Take 88 mcg by mouth daily.    . naproxen sodium (ALEVE) 220 MG tablet Take by mouth.    Marland Kitchen omeprazole (PRILOSEC) 20 MG capsule Take by mouth.    . Probiotic Product (HEALTHY COLON PO) Take 1 capsule by mouth at bedtime.  No current facility-administered medications for this visit.     Review of Systems:  GENERAL:  "I think I am good". No fevers or sweats. Weight up 2 pounds. PERFORMANCE STATUS (ECOG):  1 HEENT:  No visual changes, runny nose, sore throat, mouth sores or tenderness. Lungs: No shortness of breath or cough.  No hemoptysis. Cardiac:  No chest pain, palpitations, orthopnea, or PND. GI:  "roll in her abdomen".  No nausea, vomiting, diarrhea, constipation, melena or hematochezia. (+) frequent "stomach aches" with certain foods.  GU:  No urgency, frequency, dysuria, or hematuria. Musculoskeletal:  No back pain.  No muscle  tenderness. Extremities:  No arthritis pain in hands.  No swelling. Skin:  No rashes or skin changes. Neuro:  No headache, numbness or weakness, balance or coordination issues. Endocrine:  No diabetes.  Thyroid disease on Synthroid.  No hot flashes or night sweats. Psych:  No mood changes, depression or anxiety. Pain:  No focal pain. Review of systems:  All other systems reviewed and found to be negative.  Physical Exam: Blood pressure 119/76, pulse 87, temperature (!) 96.9 F (36.1 C), temperature source Tympanic, weight 143 lb 1.6 oz (64.9 kg). GENERAL:  Well developed, well nourished, woman sitting comfortably in the exam room in no acute distress.  MENTAL STATUS:  Alert and oriented to person, place and time. HEAD:  Short gray hair.  Normocephalic, atraumatic, face symmetric, no Cushingoid features. EYES:  Hazel eyes.  Pupils equal round and reactive to light and accomodation.  No conjunctivitis or scleral icterus. ENT:  Oropharynx clear without lesion.  Tongue normal. Mucous membranes moist.  NECK:  No murmur appreciated. RESPIRATORY:  Clear to auscultation without rales, wheezes or rhonchi. CARDIOVASCULAR:  Regular rate and rhythm without murmur, rub or gallop. BREAST:  Right breast with fibrocystic changes.  No masses, skin changes or nipple discharge.  Left breast without masses, skin changes or nipple discharge.  Inverted nipples (chronic). ABDOMEN:  Soft, non-tender, with active bowel sounds, and no hepatosplenomegaly.  No masses. SKIN:  No rashes, ulcers or lesions. EXTREMITIES: Arthritis changes in hands.  No edema, no skin discoloration or tenderness.  No palpable cords. LYMPH NODES: No palpable cervical, supraclavicular, axillary or inguinal adenopathy  NEUROLOGICAL: Unremarkable. PSYCH:  Appropriate.   Appointment on 10/13/2017  Component Date Value Ref Range Status  . Sodium 10/13/2017 134* 135 - 145 mmol/L Final  . Potassium 10/13/2017 4.4  3.5 - 5.1 mmol/L Final  .  Chloride 10/13/2017 100* 101 - 111 mmol/L Final  . CO2 10/13/2017 25  22 - 32 mmol/L Final  . Glucose, Bld 10/13/2017 105* 65 - 99 mg/dL Final  . BUN 10/13/2017 22* 6 - 20 mg/dL Final  . Creatinine, Ser 10/13/2017 0.85  0.44 - 1.00 mg/dL Final  . Calcium 10/13/2017 9.5  8.9 - 10.3 mg/dL Final  . Total Protein 10/13/2017 7.3  6.5 - 8.1 g/dL Final  . Albumin 10/13/2017 4.3  3.5 - 5.0 g/dL Final  . AST 10/13/2017 18  15 - 41 U/L Final  . ALT 10/13/2017 14  14 - 54 U/L Final  . Alkaline Phosphatase 10/13/2017 73  38 - 126 U/L Final  . Total Bilirubin 10/13/2017 0.4  0.3 - 1.2 mg/dL Final  . GFR calc non Af Amer 10/13/2017 >60  >60 mL/min Final  . GFR calc Af Amer 10/13/2017 >60  >60 mL/min Final   Comment: (NOTE) The eGFR has been calculated using the CKD EPI equation. This calculation has not been validated in  all clinical situations. eGFR's persistently <60 mL/min signify possible Chronic Kidney Disease.   . Anion gap 10/13/2017 9  5 - 15 Final  . CA 27.29 10/13/2017 17.0  0.0 - 38.6 U/mL Final   Comment: (NOTE) [employer]/ACS methodology Performed At: [facility] [place], [place] [id number] Rush Farmer MD [id number]   . WBC 10/13/2017 5.7  3.6 - 11.0 K/uL Final  . RBC 10/13/2017 4.37  3.80 - 5.20 MIL/uL Final  . Hemoglobin 10/13/2017 13.2  12.0 - 16.0 g/dL Final  . HCT 10/13/2017 39.1  35.0 - 47.0 % Final  . MCV 10/13/2017 89.5  80.0 - 100.0 fL Final  . MCH 10/13/2017 30.2  26.0 - 34.0 pg Final  . MCHC 10/13/2017 33.7  32.0 - 36.0 g/dL Final  . RDW 10/13/2017 14.6* 11.5 - 14.5 % Final  . Platelets 10/13/2017 139* 150 - 440 K/uL Final  . Neutrophils Relative % 10/13/2017 70  % Final  . Neutro Abs 10/13/2017 3.9  1.4 - 6.5 K/uL Final  . Lymphocytes Relative 10/13/2017 20  % Final  . Lymphs Abs 10/13/2017 1.2  1.0 - 3.6 K/uL Final  . Monocytes Relative 10/13/2017 7  % Final  . Monocytes Absolute 10/13/2017 0.4  0.2 - 0.9 K/uL Final  .  Eosinophils Relative 10/13/2017 3  % Final  . Eosinophils Absolute 10/13/2017 0.1  0 - 0.7 K/uL Final  . Basophils Relative 10/13/2017 0  % Final  . Basophils Absolute 10/13/2017 0.0  0 - 0.1 K/uL Final    Assessment:  UMAIZA MATUSIK is a 83 y.o. female with a history of bilateral breast cancer and chronic mild thrombocytopenia..   She initially presented with stage I left breast cancer s/p lumpectomy and sentinel lymph node biopsy on 03/09/2012. Initial biopsy revealed a 7 mm lesion in the left breast. Pathology revealed a 1 mm focus of residual invasive mammry carcinoma with DCIS. Stage T1aN0M0. Tumor was ER/PR+. Her2/neu was not assessed.  She received radiation from 04/26/2012 to 06/20/2012. She declined hormonal therapy.   Mammogram on 02/13/2014 revealed no evidence of maligancy. Mammogram and ultrasound on 02/16/2015 revealed a 5 x 3 x 2 mm hypoechoic mass at the 4 o'clock position of the right breast. Core biopsy on 02/17/2015 revealed low grade DCIS, cribiform type. Estrogen receptor was positive (> 90%) and progesterone receptor positive (> 90%).   She underwent wide excision of the right breast on 03/09/2015. Pathology revealed a 3.5 mm focus of grade II invasive carcinoma with ductal carcinoma in situ. Margins were negative. No lymph nodes were removed. Tumor was ER positive (> 90%), PR positive (51-90%) and Her2/neu negative. Pathologic stage was T1aNxMx. She completed radiation on 05/13/2015.  She decided against hormonal therapy.  Bilateral mammogram on 02/21/2017 revealed stable post lumpectomy changes bilaterally.  CA27.29 has been followed: 24.0 on 04/16/2015, 19.2 on 08/03/2015, 11.8 on 11/10/2015, 14.3 on 04/29/2016, 12.2 on 10/06/2016, 17.7 on 02/23/2017, 13.1 on 05/26/2017, and 17 on 10/13/2017.  Bone density study on 05/25/2015 revealed osteoporosis (T score of -3.3 in the femur neck and -3.5 in the forearm). Bone density on 05/25/2017 revealed osteoporosis  with a T-score of -3.4 in the left femoral neck and -3.4 in the left forearm radius.  She is taking calcium and vitamin D.  She is not interested in Prolia or Fosamax; readdressed today and patient remains adamant that she does not wish to taking anything.   She has persistent thrombocytopenia c/w mild chronic ITP.  Platelet  count has ranged between 96,000 - 161,000 without trend. Platelet count 139,000  today.   Symptomatically, she denies any breast concerns.  She has concerns regarding breast fullness.  Exam reveals fibrocystic changes in the RIGHT breast.  Labs unremarkable.   Plan: 1.  Labs today:  CBC with diff, CMP, CA27.29. 2.  Schedule bilateral mammogram on 02/21/2018. 3.  Discuss osteoporosis.  Continue calcium and vitamin D for osteoporosis.    4.  Continue to monitor platelet count with no intervention. 5.  RTC in 6 months for MD assessment and labs (CBC with diff, CMP, CA27.29)   Honor Loh, NP 10/13/2017, 2:13 PM   I saw and evaluated the patient, participating in the key portions of the service and reviewing pertinent diagnostic studies and records.  I reviewed the nurse practitioner's note and agree with the findings and the plan.  The assessment and plan were discussed with the patient.  A few questions were asked by the patient and answered.   Nolon Stalls, MD 10/13/2017, 2:13 PM

## 2017-10-14 LAB — CANCER ANTIGEN 27.29: CA 27.29: 17 U/mL (ref 0.0–38.6)

## 2017-12-05 HISTORY — PX: MASTECTOMY: SHX3

## 2018-01-18 ENCOUNTER — Other Ambulatory Visit: Payer: Self-pay | Admitting: Family Medicine

## 2018-01-18 ENCOUNTER — Ambulatory Visit
Admission: RE | Admit: 2018-01-18 | Discharge: 2018-01-18 | Disposition: A | Discharge status: Home / Self Care | Payer: Medicare Other | Source: Ambulatory Visit | Attending: Family Medicine | Admitting: Family Medicine

## 2018-01-18 DIAGNOSIS — R1013 Epigastric pain: Secondary | ICD-10-CM | POA: Diagnosis not present

## 2018-01-18 DIAGNOSIS — I700 Atherosclerosis of aorta: Secondary | ICD-10-CM | POA: Diagnosis not present

## 2018-01-18 DIAGNOSIS — K769 Liver disease, unspecified: Secondary | ICD-10-CM

## 2018-01-18 DIAGNOSIS — K219 Gastro-esophageal reflux disease without esophagitis: Secondary | ICD-10-CM

## 2018-01-22 ENCOUNTER — Other Ambulatory Visit: Payer: Self-pay | Admitting: Family Medicine

## 2018-01-29 ENCOUNTER — Other Ambulatory Visit: Payer: Self-pay | Admitting: Family Medicine

## 2018-01-29 ENCOUNTER — Ambulatory Visit: Payer: Self-pay | Admitting: General Surgery

## 2018-01-29 DIAGNOSIS — R1013 Epigastric pain: Secondary | ICD-10-CM

## 2018-01-29 DIAGNOSIS — R1906 Epigastric swelling, mass or lump: Secondary | ICD-10-CM

## 2018-01-29 NOTE — H&P (View-Only) (Signed)
PATIENT PROFILE: Aimee Gutierrez is a 84 y.o. female who presents to the Clinic for consultation at the request of Dr. Whitaker for evaluation of epigastric hernia.  PCP:  Klein, Bert Jack III, MD  HISTORY OF PRESENT ILLNESS: Ms. Aimee Gutierrez reports having pain on epigastric area. The patient refers the pain and "a knot" started on 01/18/18 on the epigastric area that radiated to the left upper quadrant. The patient refers that her PCP started her on PPI's thinking of gastritis and it improved some but then improved when "the knot" disappeared. She was fine until 3 days ago the the pain and "the knot" came back for a short period of time and it recurred last night despite taking PPI's. Pain is localized to the epigastric area and radiates to the left upper abdomen. Mild nausea with pain but no vomit. No abdominal distention. Pain not associated directly to food intake. If the knot is not present the pain is relieved. When the knot comes out patient has the pain. PPI's is not helping anymore. Tylenol is not helping either. Denies diarrhea or constipation.   Patient cannot recall if she has had a Colonoscopy Patient denies previous Upper endoscopy   PROBLEM LIST: Problem List  Date Reviewed: 07/06/2017         Noted   Primary osteoarthritis of left knee 05/28/2017   Status post total right knee replacement 11/21/2016   Chronic ITP (idiopathic thrombocytopenic purpura) (CMS-HCC) 10/06/2016   Chronic gouty arthritis 10/04/2016   Aortic atherosclerosis (CMS-HCC) 08/26/2016   Overview    By chest x-ray 9/17      Pure hypercholesterolemia 12/03/2015   Osteoarthritis Unknown   Osteoporosis Unknown   Hypothyroidism, unspecified Unknown   Dyspepsia Unknown   History of breast cancer Unknown   Overview    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 Choksi and Dr Byrnett.  Status post surgery, radiation therapy.  Followed by Dr. Corcoran         GENERAL REVIEW OF SYSTEMS:   General  ROS: negative for - chills, fatigue, fever, weight gain or weight loss Allergy and Immunology ROS: negative for - hives  Hematological and Lymphatic ROS: negative for - bleeding problems or bruising, negative for palpable nodes Endocrine ROS: negative for - heat or cold intolerance, hair changes Respiratory ROS: negative for - shortness of breath or wheezing Cardiovascular ROS: no chest pain or palpitations GI ROS: See HPI Musculoskeletal ROS: Positive for - joint swelling or muscle pain Neurological ROS: negative for - confusion, syncope Dermatological ROS: negative for pruritus and rash Psychiatric: negative for anxiety, depression, difficulty sleeping and memory loss  MEDICATIONS: Current Outpatient Medications  Medication Sig Dispense Refill  . calcium carbonate (TUMS) 200 mg calcium (500 mg) chewable tablet Take 2 tablets by mouth once daily.    . cholecalciferol (VITAMIN D3) 1,000 unit capsule Take 4,000 Units by mouth once daily.      . Lactobacillus acidophilus (PROBIOTIC ORAL) Take by mouth.    . naproxen sodium (ALEVE, ANAPROX) 220 MG tablet Take 220 mg by mouth as needed for Pain.    . omeprazole (PRILOSEC) 20 MG DR capsule TAKE 1 CAPSILE BY MOUTH 2 TIMES DAILY FOR 14 DAYS 28 capsule 2  . SYNTHROID 88 mcg tablet TAKE 1 TABLET BY MOUTH ONCE DAILY. TAKE ON EMPTY STOMACH WITH A GLASSOF WATER AT LEAST 30-60 MINUTES BEFORE BREAKFAST 30 tablet 11   No current facility-administered medications for this visit.     ALLERGIES:   Latex; Levothyroxine; Macrobid [nitrofurantoin monohyd/m-cryst]; Nitrofurantoin; and Sulfa (sulfonamide antibiotics)  PAST MEDICAL HISTORY: Past Medical History:  Diagnosis Date  . Bilateral cataracts   . Breast cancer , unspecified (CMS-HCC)    Infiltrating ductal carcinoma and ductal carcinoma in situ in left breast, detected on mammogram.  Surgery/oncology in [place], [place]; s/p lumpectomy with sentinel node dissection, T1N0M0, ER+, PR+.  Finished radiation  therapy 7/13 with Dr. Baruch Gouty.  . 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 Oliva Bustard and Dr Bary Castilla  . Dyspepsia   . History of diverticulitis   . Hypothyroidism, unspecified   . Osteoarthritis   . Osteoporosis   . Pure hypercholesterolemia 12/03/2015  . Thrombocytopenia (CMS-HCC)     PAST SURGICAL HISTORY: Past Surgical History:  Procedure Laterality Date  . Bilateral cataracts removed    . HYSTERECTOMY    . Left breast lumpectomy, sentinel node dissection  03/2012   Done in [place].  Marland Kitchen Procedure for ovarian cyst  2001  . Right total knee arthroplasty using computer-assisted navigation  10/12/2016   Dr Marry Guan  . TONSILLECTOMY       FAMILY HISTORY: Family History  Problem Relation Age of Onset  . High blood pressure (Hypertension) Mother   . Obesity Mother   . Myocardial Infarction (Heart attack) Father   . Heart disease Father   . Breast cancer Paternal Grandmother      SOCIAL HISTORY: Social History   Socioeconomic History  . Marital status: Widowed    Spouse name: Not on file  . Number of children: 4  . Years of education: 36  . Highest education level: Not on file  Occupational History  . Occupation: Retired    Comment: Engineer, mining  Social Needs  . Financial resource strain: Not on file  . Food insecurity:    Worry: Not on file    Inability: Not on file  . Transportation needs:    Medical: Not on file    Non-medical: Not on file  Tobacco Use  . Smoking status: Former Smoker    Packs/day: 0.50    Years: 20.00    Pack years: 10.00    Types: Cigarettes    Last attempt to quit: 2010    Years since quitting: 9.1  . Smokeless tobacco: Never Used  Substance and Sexual Activity  . Alcohol use: No    Comment: Occasional  . Drug use: No  . Sexual activity: Defer    Partners: Male  Lifestyle  . Physical activity:    Days per week: Not on file    Minutes per session: Not on file  . Stress: Not  on file  Relationships  . Social connections:    Talks on phone: Not on file    Gets together: Not on file    Attends religious service: Not on file    Active member of club or organization: Not on file    Attends meetings of clubs or organizations: Not on file    Relationship status: Not on file  . Intimate partner violence:    Fear of current or ex partner: Not on file    Emotionally abused: Not on file    Physically abused: Not on file    Forced sexual activity: Not on file  Other Topics Concern  . Not on file  Social History Narrative  . Not on file    PHYSICAL EXAM: Vitals:   01/29/18 1043  BP: 141/87  Pulse: 67  Temp: 36.2 C (97.1 F)   Body mass index is 23.8 kg/m. Weight: 64.9 kg (143 lb)   GENERAL: Alert, active, oriented x3  HEENT: Pupils equal reactive to light. Extraocular movements are intact. Sclera clear. Palpebral conjunctiva normal red color.Pharynx clear.  NECK: Supple with no palpable mass and no adenopathy.  LUNGS: Sound clear with no rales rhonchi or wheezes.  HEART: Regular rhythm S1 and S2 without murmur.  ABDOMEN: Soft and depressible, nontender with no palpable mass, no hepatomegaly. Palpable mass on the epigastric area, tender to palpation. No erythema. Most likely incarcerated fat. No sign of strangulation at this moment.   EXTREMITIES: Well-developed well-nourished symmetrical with no dependent edema.  NEUROLOGICAL: Awake alert oriented, facial expression symmetrical, moving all extremities.  REVIEW OF DATA: I have reviewed the following data today: Office Visit on 01/18/2018  Component Date Value  . WBC (White Blood Cell Co* 01/18/2018 5.1   . RBC (Red Blood Cell Coun* 01/18/2018 4.67   . Hemoglobin 01/18/2018 13.8   . Hematocrit 01/18/2018 43.5   . MCV (Mean Corpuscular Vo* 01/18/2018 93.1   . MCH (Mean Corpuscular He* 01/18/2018 29.6   . MCHC (Mean Corpuscular H* 01/18/2018 31.7*  . Platelet Count 01/18/2018 135*  . RDW-CV  (Red Cell Distrib* 01/18/2018 14.1   . Neutrophils 01/18/2018 3.62   . Lymphocytes 01/18/2018 0.94*  . Monocytes 01/18/2018 0.37   . Eosinophils 01/18/2018 0.12   . Basophils 01/18/2018 0.07   . Neutrophil % 01/18/2018 70.6*  . Lymphocyte % 01/18/2018 18.3   . Monocyte % 01/18/2018 7.2   . Eosinophil % 01/18/2018 2.3   . Basophil% 01/18/2018 1.4   . Immature Granulocyte % 01/18/2018 0.2   . Immature Granulocyte Cou* 01/18/2018 0.01   . Glucose 01/18/2018 95   . Sodium 01/18/2018 138   . Potassium 01/18/2018 4.3   . Chloride 01/18/2018 100   . Carbon Dioxide (CO2) 01/18/2018 33.7*  . Urea Nitrogen (BUN) 01/18/2018 19   . Creatinine 01/18/2018 0.9   . Glomerular Filtration Ra* 01/18/2018 60*  . Calcium 01/18/2018 9.9   . AST  01/18/2018 15   . ALT  01/18/2018 14   . Alk Phos (alkaline Phosp* 01/18/2018 63   . Albumin 01/18/2018 4.1   . Bilirubin, Total 01/18/2018 0.6   . Protein, Total 01/18/2018 7.1   . A/G Ratio 01/18/2018 1.4   . Lipase 01/18/2018 35      ASSESSMENT: Ms. Lindvall is a 84 y.o. female presenting for consultation for epigastric hernia. Patient with sign and symptoms of epigastric hernia. Looks like incarcerated fat unable to be reduced due to tenderness. No sign of obstruction. CT scan pending to confirm the hernia since I was not able to palpate the defect and to rule out any other defect on the epigastric/midline wall that we may repair on same surgery. This will help to decide if doing open approach of a single hernia vs laparoscopic approach of multiple/complicated ventral hernia.     PLAN: 1. Ventral hernia repair with insertion of mesh (49561, 49568) 2. CBC, CMP, done 3. Internal Medicine Clearance.  4. Avoid heavy lifting 5. May use ice packs on affected area for soreness  Patient and her daughter verbalized understanding, all questions were answered, and were agreeable with the plan outlined above.   Taletha Twiford Cintron-Diaz, MD  Electronically signed by  Shravan Salahuddin Cintron-Diaz, MD 

## 2018-01-29 NOTE — H&P (Signed)
PATIENT PROFILE: Aimee Gutierrez is a 84 y.o. female who presents to the Clinic for consultation at the request of Dr. Whitaker for evaluation of epigastric hernia.  PCP:  Klein, Bert Jack III, MD  HISTORY OF PRESENT ILLNESS: Aimee Gutierrez reports having pain on epigastric area. The patient refers the pain and "a knot" started on 01/18/18 on the epigastric area that radiated to the left upper quadrant. The patient refers that her PCP started her on PPI's thinking of gastritis and it improved some but then improved when "the knot" disappeared. She was fine until 3 days ago the the pain and "the knot" came back for a short period of time and it recurred last night despite taking PPI's. Pain is localized to the epigastric area and radiates to the left upper abdomen. Mild nausea with pain but no vomit. No abdominal distention. Pain not associated directly to food intake. If the knot is not present the pain is relieved. When the knot comes out patient has the pain. PPI's is not helping anymore. Tylenol is not helping either. Denies diarrhea or constipation.   Patient cannot recall if she has had a Colonoscopy Patient denies previous Upper endoscopy   PROBLEM LIST: Problem List  Date Reviewed: 07/06/2017         Noted   Primary osteoarthritis of left knee 05/28/2017   Status post total right knee replacement 11/21/2016   Chronic ITP (idiopathic thrombocytopenic purpura) (CMS-HCC) 10/06/2016   Chronic gouty arthritis 10/04/2016   Aortic atherosclerosis (CMS-HCC) 08/26/2016   Overview    By chest x-ray 9/17      Pure hypercholesterolemia 12/03/2015   Osteoarthritis Unknown   Osteoporosis Unknown   Hypothyroidism, unspecified Unknown   Dyspepsia Unknown   History of breast cancer Unknown   Overview    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 Choksi and Dr Byrnett.  Status post surgery, radiation therapy.  Followed by Dr. Corcoran         GENERAL REVIEW OF SYSTEMS:   General  ROS: negative for - chills, fatigue, fever, weight gain or weight loss Allergy and Immunology ROS: negative for - hives  Hematological and Lymphatic ROS: negative for - bleeding problems or bruising, negative for palpable nodes Endocrine ROS: negative for - heat or cold intolerance, hair changes Respiratory ROS: negative for - shortness of breath or wheezing Cardiovascular ROS: no chest pain or palpitations GI ROS: See HPI Musculoskeletal ROS: Positive for - joint swelling or muscle pain Neurological ROS: negative for - confusion, syncope Dermatological ROS: negative for pruritus and rash Psychiatric: negative for anxiety, depression, difficulty sleeping and memory loss  MEDICATIONS: Current Outpatient Medications  Medication Sig Dispense Refill  . calcium carbonate (TUMS) 200 mg calcium (500 mg) chewable tablet Take 2 tablets by mouth once daily.    . cholecalciferol (VITAMIN D3) 1,000 unit capsule Take 4,000 Units by mouth once daily.      . Lactobacillus acidophilus (PROBIOTIC ORAL) Take by mouth.    . naproxen sodium (ALEVE, ANAPROX) 220 MG tablet Take 220 mg by mouth as needed for Pain.    . omeprazole (PRILOSEC) 20 MG DR capsule TAKE 1 CAPSILE BY MOUTH 2 TIMES DAILY FOR 14 DAYS 28 capsule 2  . SYNTHROID 88 mcg tablet TAKE 1 TABLET BY MOUTH ONCE DAILY. TAKE ON EMPTY STOMACH WITH A GLASSOF WATER AT LEAST 30-60 MINUTES BEFORE BREAKFAST 30 tablet 11   No current facility-administered medications for this visit.     ALLERGIES:   Latex; Levothyroxine; Macrobid [nitrofurantoin monohyd/m-cryst]; Nitrofurantoin; and Sulfa (sulfonamide antibiotics)  PAST MEDICAL HISTORY: Past Medical History:  Diagnosis Date  . Bilateral cataracts   . Breast cancer , unspecified (CMS-HCC)    Infiltrating ductal carcinoma and ductal carcinoma in situ in left breast, detected on mammogram.  Surgery/oncology in [place], [place]; s/p lumpectomy with sentinel node dissection, T1N0M0, ER+, PR+.  Finished radiation  therapy 7/13 with Dr. Baruch Gouty.  . 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 Oliva Bustard and Dr Bary Castilla  . Dyspepsia   . History of diverticulitis   . Hypothyroidism, unspecified   . Osteoarthritis   . Osteoporosis   . Pure hypercholesterolemia 12/03/2015  . Thrombocytopenia (CMS-HCC)     PAST SURGICAL HISTORY: Past Surgical History:  Procedure Laterality Date  . Bilateral cataracts removed    . HYSTERECTOMY    . Left breast lumpectomy, sentinel node dissection  03/2012   Done in [place].  Marland Kitchen Procedure for ovarian cyst  2001  . Right total knee arthroplasty using computer-assisted navigation  10/12/2016   Dr Marry Guan  . TONSILLECTOMY       FAMILY HISTORY: Family History  Problem Relation Age of Onset  . High blood pressure (Hypertension) Mother   . Obesity Mother   . Myocardial Infarction (Heart attack) Father   . Heart disease Father   . Breast cancer Paternal Grandmother      SOCIAL HISTORY: Social History   Socioeconomic History  . Marital status: Widowed    Spouse name: Not on file  . Number of children: 4  . Years of education: 36  . Highest education level: Not on file  Occupational History  . Occupation: Retired    Comment: Engineer, mining  Social Needs  . Financial resource strain: Not on file  . Food insecurity:    Worry: Not on file    Inability: Not on file  . Transportation needs:    Medical: Not on file    Non-medical: Not on file  Tobacco Use  . Smoking status: Former Smoker    Packs/day: 0.50    Years: 20.00    Pack years: 10.00    Types: Cigarettes    Last attempt to quit: 2010    Years since quitting: 9.1  . Smokeless tobacco: Never Used  Substance and Sexual Activity  . Alcohol use: No    Comment: Occasional  . Drug use: No  . Sexual activity: Defer    Partners: Male  Lifestyle  . Physical activity:    Days per week: Not on file    Minutes per session: Not on file  . Stress: Not  on file  Relationships  . Social connections:    Talks on phone: Not on file    Gets together: Not on file    Attends religious service: Not on file    Active member of club or organization: Not on file    Attends meetings of clubs or organizations: Not on file    Relationship status: Not on file  . Intimate partner violence:    Fear of current or ex partner: Not on file    Emotionally abused: Not on file    Physically abused: Not on file    Forced sexual activity: Not on file  Other Topics Concern  . Not on file  Social History Narrative  . Not on file    PHYSICAL EXAM: Vitals:   01/29/18 1043  BP: 141/87  Pulse: 67  Temp: 36.2 C (97.1 F)   Body mass index is 23.8 kg/m. Weight: 64.9 kg (143 lb)   GENERAL: Alert, active, oriented x3  HEENT: Pupils equal reactive to light. Extraocular movements are intact. Sclera clear. Palpebral conjunctiva normal red color.Pharynx clear.  NECK: Supple with no palpable mass and no adenopathy.  LUNGS: Sound clear with no rales rhonchi or wheezes.  HEART: Regular rhythm S1 and S2 without murmur.  ABDOMEN: Soft and depressible, nontender with no palpable mass, no hepatomegaly. Palpable mass on the epigastric area, tender to palpation. No erythema. Most likely incarcerated fat. No sign of strangulation at this moment.   EXTREMITIES: Well-developed well-nourished symmetrical with no dependent edema.  NEUROLOGICAL: Awake alert oriented, facial expression symmetrical, moving all extremities.  REVIEW OF DATA: I have reviewed the following data today: Office Visit on 01/18/2018  Component Date Value  . WBC (White Blood Cell Co* 01/18/2018 5.1   . RBC (Red Blood Cell Coun* 01/18/2018 4.67   . Hemoglobin 01/18/2018 13.8   . Hematocrit 01/18/2018 43.5   . MCV (Mean Corpuscular Vo* 01/18/2018 93.1   . MCH (Mean Corpuscular He* 01/18/2018 29.6   . MCHC (Mean Corpuscular H* 01/18/2018 31.7*  . Platelet Count 01/18/2018 135*  . RDW-CV  (Red Cell Distrib* 01/18/2018 14.1   . Neutrophils 01/18/2018 3.62   . Lymphocytes 01/18/2018 0.94*  . Monocytes 01/18/2018 0.37   . Eosinophils 01/18/2018 0.12   . Basophils 01/18/2018 0.07   . Neutrophil % 01/18/2018 70.6*  . Lymphocyte % 01/18/2018 18.3   . Monocyte % 01/18/2018 7.2   . Eosinophil % 01/18/2018 2.3   . Basophil% 01/18/2018 1.4   . Immature Granulocyte % 01/18/2018 0.2   . Immature Granulocyte Cou* 01/18/2018 0.01   . Glucose 01/18/2018 95   . Sodium 01/18/2018 138   . Potassium 01/18/2018 4.3   . Chloride 01/18/2018 100   . Carbon Dioxide (CO2) 01/18/2018 33.7*  . Urea Nitrogen (BUN) 01/18/2018 19   . Creatinine 01/18/2018 0.9   . Glomerular Filtration Ra* 01/18/2018 60*  . Calcium 01/18/2018 9.9   . AST  01/18/2018 15   . ALT  01/18/2018 14   . Alk Phos (alkaline Phosp* 01/18/2018 63   . Albumin 01/18/2018 4.1   . Bilirubin, Total 01/18/2018 0.6   . Protein, Total 01/18/2018 7.1   . A/G Ratio 01/18/2018 1.4   . Lipase 01/18/2018 35      ASSESSMENT: Ms. Royer is a 84 y.o. female presenting for consultation for epigastric hernia. Patient with sign and symptoms of epigastric hernia. Looks like incarcerated fat unable to be reduced due to tenderness. No sign of obstruction. CT scan pending to confirm the hernia since I was not able to palpate the defect and to rule out any other defect on the epigastric/midline wall that we may repair on same surgery. This will help to decide if doing open approach of a single hernia vs laparoscopic approach of multiple/complicated ventral hernia.     PLAN: 1. Ventral hernia repair with insertion of mesh (49561, 49568) 2. CBC, CMP, done 3. Internal Medicine Clearance.  4. Avoid heavy lifting 5. May use ice packs on affected area for soreness  Patient and her daughter verbalized understanding, all questions were answered, and were agreeable with the plan outlined above.    Cintron-Diaz, MD  Electronically signed by   Cintron-Diaz, MD 

## 2018-01-30 ENCOUNTER — Ambulatory Visit
Admission: RE | Admit: 2018-01-30 | Discharge: 2018-01-30 | Disposition: A | Discharge status: Home / Self Care | Payer: Medicare Other | Source: Ambulatory Visit | Attending: Family Medicine | Admitting: Family Medicine

## 2018-01-30 ENCOUNTER — Other Ambulatory Visit: Payer: Self-pay | Admitting: Family Medicine

## 2018-01-30 DIAGNOSIS — I700 Atherosclerosis of aorta: Secondary | ICD-10-CM | POA: Insufficient documentation

## 2018-01-30 DIAGNOSIS — M47816 Spondylosis without myelopathy or radiculopathy, lumbar region: Secondary | ICD-10-CM | POA: Diagnosis not present

## 2018-01-30 DIAGNOSIS — K769 Liver disease, unspecified: Secondary | ICD-10-CM

## 2018-01-30 DIAGNOSIS — R1906 Epigastric swelling, mass or lump: Secondary | ICD-10-CM | POA: Diagnosis not present

## 2018-01-30 DIAGNOSIS — R935 Abnormal findings on diagnostic imaging of other abdominal regions, including retroperitoneum: Secondary | ICD-10-CM | POA: Diagnosis not present

## 2018-01-30 DIAGNOSIS — Z853 Personal history of malignant neoplasm of breast: Secondary | ICD-10-CM | POA: Diagnosis not present

## 2018-01-30 DIAGNOSIS — Z90710 Acquired absence of both cervix and uterus: Secondary | ICD-10-CM | POA: Insufficient documentation

## 2018-01-30 DIAGNOSIS — K5730 Diverticulosis of large intestine without perforation or abscess without bleeding: Secondary | ICD-10-CM | POA: Insufficient documentation

## 2018-01-30 DIAGNOSIS — M4186 Other forms of scoliosis, lumbar region: Secondary | ICD-10-CM | POA: Insufficient documentation

## 2018-01-30 DIAGNOSIS — R1013 Epigastric pain: Secondary | ICD-10-CM | POA: Insufficient documentation

## 2018-01-30 MED ORDER — IOPAMIDOL (ISOVUE-300) INJECTION 61%
75.0000 mL | Freq: Once | INTRAVENOUS | Status: AC | PRN
  Administered 2018-01-30: 75 mL via INTRAVENOUS

## 2018-01-30 MED ORDER — IOPAMIDOL (ISOVUE-300) INJECTION 61%: 85.0000 mL | Freq: Once | INTRAVENOUS | Status: DC | PRN

## 2018-01-31 ENCOUNTER — Encounter
Admission: RE | Admit: 2018-01-31 | Discharge: 2018-01-31 | Disposition: A | Discharge status: Home / Self Care | Payer: Medicare Other | Source: Ambulatory Visit | Attending: General Surgery | Admitting: General Surgery

## 2018-01-31 ENCOUNTER — Other Ambulatory Visit: Payer: Self-pay

## 2018-01-31 DIAGNOSIS — M810 Age-related osteoporosis without current pathological fracture: Secondary | ICD-10-CM | POA: Diagnosis not present

## 2018-01-31 DIAGNOSIS — I739 Peripheral vascular disease, unspecified: Secondary | ICD-10-CM | POA: Diagnosis not present

## 2018-01-31 DIAGNOSIS — K589 Irritable bowel syndrome without diarrhea: Secondary | ICD-10-CM | POA: Diagnosis not present

## 2018-01-31 DIAGNOSIS — K219 Gastro-esophageal reflux disease without esophagitis: Secondary | ICD-10-CM | POA: Diagnosis not present

## 2018-01-31 DIAGNOSIS — E039 Hypothyroidism, unspecified: Secondary | ICD-10-CM | POA: Diagnosis not present

## 2018-01-31 DIAGNOSIS — Z79899 Other long term (current) drug therapy: Secondary | ICD-10-CM | POA: Diagnosis not present

## 2018-01-31 DIAGNOSIS — E7800 Pure hypercholesterolemia, unspecified: Secondary | ICD-10-CM | POA: Diagnosis not present

## 2018-01-31 DIAGNOSIS — Z87891 Personal history of nicotine dependence: Secondary | ICD-10-CM | POA: Diagnosis not present

## 2018-01-31 DIAGNOSIS — M1990 Unspecified osteoarthritis, unspecified site: Secondary | ICD-10-CM | POA: Diagnosis not present

## 2018-01-31 DIAGNOSIS — K436 Other and unspecified ventral hernia with obstruction, without gangrene: Secondary | ICD-10-CM | POA: Diagnosis present

## 2018-01-31 DIAGNOSIS — Z881 Allergy status to other antibiotic agents status: Secondary | ICD-10-CM | POA: Diagnosis not present

## 2018-01-31 DIAGNOSIS — D693 Immune thrombocytopenic purpura: Secondary | ICD-10-CM | POA: Diagnosis not present

## 2018-01-31 DIAGNOSIS — Z91040 Latex allergy status: Secondary | ICD-10-CM | POA: Diagnosis not present

## 2018-01-31 DIAGNOSIS — Z96651 Presence of right artificial knee joint: Secondary | ICD-10-CM | POA: Diagnosis not present

## 2018-01-31 DIAGNOSIS — Z888 Allergy status to other drugs, medicaments and biological substances status: Secondary | ICD-10-CM | POA: Diagnosis not present

## 2018-01-31 DIAGNOSIS — Z882 Allergy status to sulfonamides status: Secondary | ICD-10-CM | POA: Diagnosis not present

## 2018-01-31 DIAGNOSIS — I700 Atherosclerosis of aorta: Secondary | ICD-10-CM | POA: Diagnosis not present

## 2018-01-31 HISTORY — DX: Gastro-esophageal reflux disease without esophagitis: K21.9

## 2018-01-31 MED ORDER — CEFAZOLIN SODIUM-DEXTROSE 2-4 GM/100ML-% IV SOLN
2.0000 g | INTRAVENOUS | Status: AC
  Administered 2018-02-01: 2 g via INTRAVENOUS

## 2018-01-31 NOTE — Patient Instructions (Signed)
Your procedure is scheduled on: 02/01/18 Wed @12 :30 Report to Same Day Surgery 2nd floor medical mall [facility] Entrance-take elevator on left to 2nd floor.  Check in with surgery information desk.)   Remember: Instructions that are not followed completely may result in serious medical risk, up to and including death, or upon the discretion of your surgeon and anesthesiologist your surgery may need to be rescheduled.    _x___ 1. Do not eat food after midnight the night before your procedure. You may drink clear liquids up to 2 hours before you are scheduled to arrive at the hospital for your procedure.  Do not drink clear liquids within 2 hours of your scheduled arrival to the hospital.  Clear liquids include  --Water or Apple juice without pulp  --Clear carbohydrate beverage such as ClearFast or Gatorade  --Black Coffee or Clear Tea (No milk, no creamers, do not add anything to                  the coffee or Tea Type 1 and type 2 diabetics should only drink water.  .     __x__ 2. No Alcohol for 24 hours before or after surgery.   __x__3. No Smoking or e-cigarettes for 24 prior to surgery.  Do not use any chewable tobacco products for at least 6 hour prior to surgery   ____  4. Bring all medications with you on the day of surgery if instructed.    __x__ 5. Notify your doctor if there is any change in your medical condition     (cold, fever, infections).    x___6. On the morning of surgery brush your teeth with toothpaste and water.  You may rinse your mouth with mouth wash if you wish.  Do not swallow any toothpaste or mouthwash.   Do not wear jewelry, make-up, hairpins, clips or nail polish.  Do not wear lotions, powders, or perfumes. You may wear deodorant.  Do not shave 48 hours prior to surgery. Men may shave face and neck.  Do not bring valuables to the hospital.    [facility] is not responsible for any belongings or valuables.               Contacts, dentures or bridgework  may not be worn into surgery.  Leave your suitcase in the car. After surgery it may be brought to your room.  For patients admitted to the hospital, discharge time is determined by your                       treatment team.  _  Patients discharged the day of surgery will not be allowed to drive home.  You will need someone to drive you home and stay with you the night of your procedure.    Please read over the following fact sheets that you were given:   [facility] Preparing for Surgery and or MRSA Information   _x___ Take anti-hypertensive listed below, cardiac, seizure, asthma,     anti-reflux and psychiatric medicines. These include:  1. levothyroxine (SYNTHROID, LEVOTHROID) 88 MCG tablet  2.Omeprazole (PRILOSEC PO  The night before and the morning of surgery  3.  4.  5.  6.  ____Fleets enema or Magnesium Citrate as directed.   _x___ Use CHG Soap or sage wipes as directed on instruction sheet   ____ Use inhalers on the day of surgery and bring to hospital day of surgery  ____ Stop Metformin and  Janumet 2 days prior to surgery.    ____ Take 1/2 of usual insulin dose the night before surgery and none on the morning     surgery.   _x___ Follow recommendations from Cardiologist, Pulmonologist or PCP regarding          stopping Aspirin, Coumadin, Plavix ,Eliquis, Effient, or Pradaxa, and Pletal.  X____Stop Anti-inflammatories such as Advil, Aleve, Ibuprofen, Motrin, Naproxen, Naprosyn, Goodies powders or aspirin products. OK to take Tylenol and                          Celebrex.   _x___ Stop supplements until after surgery.  But may continue Vitamin D, Vitamin B,       and multivitamin.   ____ Bring C-Pap to the hospital.

## 2018-02-01 ENCOUNTER — Encounter: Payer: Self-pay | Admitting: Anesthesiology

## 2018-02-01 ENCOUNTER — Ambulatory Visit: Payer: Medicare Other | Admitting: Certified Registered Nurse Anesthetist

## 2018-02-01 ENCOUNTER — Ambulatory Visit
Admission: RE | Admit: 2018-02-01 | Discharge: 2018-02-01 | Disposition: A | Discharge status: Home / Self Care | Payer: Medicare Other | Source: Ambulatory Visit | Attending: General Surgery | Admitting: General Surgery

## 2018-02-01 ENCOUNTER — Encounter
Admission: RE | Disposition: A | Discharge status: Home / Self Care | Payer: Self-pay | Source: Ambulatory Visit | Attending: General Surgery

## 2018-02-01 DIAGNOSIS — E039 Hypothyroidism, unspecified: Secondary | ICD-10-CM | POA: Insufficient documentation

## 2018-02-01 DIAGNOSIS — E7800 Pure hypercholesterolemia, unspecified: Secondary | ICD-10-CM | POA: Insufficient documentation

## 2018-02-01 DIAGNOSIS — K436 Other and unspecified ventral hernia with obstruction, without gangrene: Secondary | ICD-10-CM | POA: Diagnosis not present

## 2018-02-01 DIAGNOSIS — D693 Immune thrombocytopenic purpura: Secondary | ICD-10-CM | POA: Insufficient documentation

## 2018-02-01 DIAGNOSIS — Z882 Allergy status to sulfonamides status: Secondary | ICD-10-CM | POA: Insufficient documentation

## 2018-02-01 DIAGNOSIS — K589 Irritable bowel syndrome without diarrhea: Secondary | ICD-10-CM | POA: Insufficient documentation

## 2018-02-01 DIAGNOSIS — Z87891 Personal history of nicotine dependence: Secondary | ICD-10-CM | POA: Insufficient documentation

## 2018-02-01 DIAGNOSIS — Z96651 Presence of right artificial knee joint: Secondary | ICD-10-CM | POA: Insufficient documentation

## 2018-02-01 DIAGNOSIS — M810 Age-related osteoporosis without current pathological fracture: Secondary | ICD-10-CM | POA: Insufficient documentation

## 2018-02-01 DIAGNOSIS — Z79899 Other long term (current) drug therapy: Secondary | ICD-10-CM | POA: Insufficient documentation

## 2018-02-01 DIAGNOSIS — K219 Gastro-esophageal reflux disease without esophagitis: Secondary | ICD-10-CM | POA: Insufficient documentation

## 2018-02-01 DIAGNOSIS — Z91040 Latex allergy status: Secondary | ICD-10-CM | POA: Insufficient documentation

## 2018-02-01 DIAGNOSIS — Z881 Allergy status to other antibiotic agents status: Secondary | ICD-10-CM | POA: Insufficient documentation

## 2018-02-01 DIAGNOSIS — M1990 Unspecified osteoarthritis, unspecified site: Secondary | ICD-10-CM | POA: Insufficient documentation

## 2018-02-01 DIAGNOSIS — Z888 Allergy status to other drugs, medicaments and biological substances status: Secondary | ICD-10-CM | POA: Insufficient documentation

## 2018-02-01 DIAGNOSIS — I739 Peripheral vascular disease, unspecified: Secondary | ICD-10-CM | POA: Insufficient documentation

## 2018-02-01 DIAGNOSIS — I700 Atherosclerosis of aorta: Secondary | ICD-10-CM | POA: Insufficient documentation

## 2018-02-01 HISTORY — PX: VENTRAL HERNIA REPAIR: SHX424

## 2018-02-01 SURGERY — REPAIR, HERNIA, VENTRAL
Anesthesia: General | Wound class: Clean

## 2018-02-01 MED ORDER — MIDAZOLAM HCL 2 MG/2ML IJ SOLN
INTRAMUSCULAR | Status: DC | PRN
  Administered 2018-02-01 (×2): 1 mg via INTRAVENOUS

## 2018-02-01 MED ORDER — BUPIVACAINE-EPINEPHRINE (PF) 0.5% -1:200000 IJ SOLN
INTRAMUSCULAR | Status: AC
  Filled 2018-02-01: qty 30

## 2018-02-01 MED ORDER — FENTANYL CITRATE (PF) 100 MCG/2ML IJ SOLN
25.0000 ug | INTRAMUSCULAR | Status: DC | PRN
  Administered 2018-02-01 (×4): 25 ug via INTRAVENOUS

## 2018-02-01 MED ORDER — OXYCODONE HCL 5 MG PO TABS
5.0000 mg | ORAL_TABLET | Freq: Once | ORAL | Status: AC | PRN
Start: 2018-02-01 — End: 2018-02-01
  Administered 2018-02-01: 5 mg via ORAL

## 2018-02-01 MED ORDER — PROPOFOL 10 MG/ML IV BOLUS
INTRAVENOUS | Status: AC
  Filled 2018-02-01: qty 20

## 2018-02-01 MED ORDER — OXYCODONE HCL 5 MG PO TABS
ORAL_TABLET | ORAL | Status: AC
  Administered 2018-02-01: 5 mg via ORAL
  Filled 2018-02-01: qty 1

## 2018-02-01 MED ORDER — LACTATED RINGERS IV SOLN
INTRAVENOUS | Status: DC
  Administered 2018-02-01: 13:00:00 via INTRAVENOUS

## 2018-02-01 MED ORDER — FENTANYL CITRATE (PF) 100 MCG/2ML IJ SOLN
INTRAMUSCULAR | Status: AC
  Filled 2018-02-01: qty 2

## 2018-02-01 MED ORDER — ONDANSETRON HCL 4 MG/2ML IJ SOLN
INTRAMUSCULAR | Status: DC | PRN
  Administered 2018-02-01: 4 mg via INTRAVENOUS

## 2018-02-01 MED ORDER — PROPOFOL 10 MG/ML IV BOLUS
INTRAVENOUS | Status: DC | PRN
  Administered 2018-02-01: 100 mg via INTRAVENOUS

## 2018-02-01 MED ORDER — ONDANSETRON HCL 4 MG/2ML IJ SOLN: 4.0000 mg | Freq: Once | INTRAMUSCULAR | Status: DC | PRN

## 2018-02-01 MED ORDER — BUPIVACAINE-EPINEPHRINE 0.5% -1:200000 IJ SOLN
INTRAMUSCULAR | Status: DC | PRN
  Administered 2018-02-01: 20 mL

## 2018-02-01 MED ORDER — EPHEDRINE SULFATE 50 MG/ML IJ SOLN
INTRAMUSCULAR | Status: DC | PRN
  Administered 2018-02-01: 5 mg via INTRAVENOUS

## 2018-02-01 MED ORDER — ACETAMINOPHEN 10 MG/ML IV SOLN
INTRAVENOUS | Status: AC
  Filled 2018-02-01: qty 100

## 2018-02-01 MED ORDER — SUCCINYLCHOLINE CHLORIDE 20 MG/ML IJ SOLN
INTRAMUSCULAR | Status: DC | PRN
  Administered 2018-02-01: 100 mg via INTRAVENOUS

## 2018-02-01 MED ORDER — SUGAMMADEX SODIUM 200 MG/2ML IV SOLN
INTRAVENOUS | Status: DC | PRN
  Administered 2018-02-01: 127 mg via INTRAVENOUS

## 2018-02-01 MED ORDER — SUGAMMADEX SODIUM 200 MG/2ML IV SOLN
INTRAVENOUS | Status: AC
  Filled 2018-02-01: qty 2

## 2018-02-01 MED ORDER — ROCURONIUM BROMIDE 50 MG/5ML IV SOLN
INTRAVENOUS | Status: AC
  Filled 2018-02-01: qty 1

## 2018-02-01 MED ORDER — LIDOCAINE HCL (CARDIAC) 20 MG/ML IV SOLN
INTRAVENOUS | Status: DC | PRN
  Administered 2018-02-01: 80 mg via INTRAVENOUS

## 2018-02-01 MED ORDER — MIDAZOLAM HCL 2 MG/2ML IJ SOLN
INTRAMUSCULAR | Status: AC
  Filled 2018-02-01: qty 2

## 2018-02-01 MED ORDER — CEFAZOLIN SODIUM-DEXTROSE 2-4 GM/100ML-% IV SOLN
INTRAVENOUS | Status: AC
  Filled 2018-02-01: qty 100

## 2018-02-01 MED ORDER — ROCURONIUM BROMIDE 100 MG/10ML IV SOLN
INTRAVENOUS | Status: DC | PRN
  Administered 2018-02-01: 5 mg via INTRAVENOUS
  Administered 2018-02-01: 25 mg via INTRAVENOUS

## 2018-02-01 MED ORDER — LIDOCAINE HCL (PF) 2 % IJ SOLN
INTRAMUSCULAR | Status: AC
  Filled 2018-02-01: qty 10

## 2018-02-01 MED ORDER — OXYCODONE HCL 5 MG PO TABS
ORAL_TABLET | ORAL | Status: AC
  Filled 2018-02-01: qty 1

## 2018-02-01 MED ORDER — ACETAMINOPHEN 10 MG/ML IV SOLN
INTRAVENOUS | Status: DC | PRN
  Administered 2018-02-01: 1000 mg via INTRAVENOUS

## 2018-02-01 MED ORDER — BUPIVACAINE-EPINEPHRINE (PF) 0.25% -1:200000 IJ SOLN
INTRAMUSCULAR | Status: AC
  Filled 2018-02-01: qty 10

## 2018-02-01 MED ORDER — FENTANYL CITRATE (PF) 100 MCG/2ML IJ SOLN
INTRAMUSCULAR | Status: AC
Start: 2018-02-01 — End: 2018-02-01
  Administered 2018-02-01: 25 ug via INTRAVENOUS
  Filled 2018-02-01: qty 2

## 2018-02-01 MED ORDER — DEXAMETHASONE SODIUM PHOSPHATE 10 MG/ML IJ SOLN
INTRAMUSCULAR | Status: DC | PRN
  Administered 2018-02-01: 5 mg via INTRAVENOUS

## 2018-02-01 MED ORDER — HYDROCODONE-ACETAMINOPHEN 5-325 MG PO TABS: 1.0000 | ORAL_TABLET | ORAL | 0 refills | Status: AC | PRN

## 2018-02-01 MED ORDER — SUCCINYLCHOLINE CHLORIDE 20 MG/ML IJ SOLN
INTRAMUSCULAR | Status: AC
  Filled 2018-02-01: qty 1

## 2018-02-01 MED ORDER — FENTANYL CITRATE (PF) 100 MCG/2ML IJ SOLN
INTRAMUSCULAR | Status: DC | PRN
  Administered 2018-02-01 (×4): 25 ug via INTRAVENOUS

## 2018-02-01 MED ORDER — EPHEDRINE SULFATE 50 MG/ML IJ SOLN
INTRAMUSCULAR | Status: AC
  Filled 2018-02-01: qty 1

## 2018-02-01 SURGICAL SUPPLY — 24 items
BLADE SURG 15 STRL LF DISP TIS (BLADE) ×1 IMPLANT
BLADE SURG 15 STRL SS (BLADE) ×2
CANISTER SUCT 1200ML W/VALVE (MISCELLANEOUS) ×3 IMPLANT
CHLORAPREP W/TINT 26ML (MISCELLANEOUS) ×3 IMPLANT
DERMABOND ADVANCED (GAUZE/BANDAGES/DRESSINGS) ×2
DERMABOND ADVANCED .7 DNX12 (GAUZE/BANDAGES/DRESSINGS) ×1 IMPLANT
DRAPE LAPAROTOMY 100X77 ABD (DRAPES) ×3 IMPLANT
ELECT REM PT RETURN 9FT ADLT (ELECTROSURGICAL) ×3
ELECTRODE REM PT RTRN 9FT ADLT (ELECTROSURGICAL) ×1 IMPLANT
GLOVE BIO SURGEON STRL SZ7.5 (GLOVE) ×3 IMPLANT
GOWN STRL REUS W/ TWL LRG LVL3 (GOWN DISPOSABLE) ×3 IMPLANT
GOWN STRL REUS W/TWL LRG LVL3 (GOWN DISPOSABLE) ×6
KIT TURNOVER KIT A (KITS) ×3 IMPLANT
LABEL OR SOLS (LABEL) ×3 IMPLANT
NEEDLE HYPO 25X1 1.5 SAFETY (NEEDLE) ×3 IMPLANT
NS IRRIG 500ML POUR BTL (IV SOLUTION) ×3 IMPLANT
PACK BASIN MINOR ARMC (MISCELLANEOUS) ×3 IMPLANT
SUT MNCRL 4-0 (SUTURE) ×2
SUT MNCRL 4-0 27XMFL (SUTURE) ×1
SUT PROLENE 0 CT 2 (SUTURE) ×3 IMPLANT
SUT VIC AB 2-0 SH 27 (SUTURE) ×2
SUT VIC AB 2-0 SH 27XBRD (SUTURE) ×1 IMPLANT
SUTURE MNCRL 4-0 27XMF (SUTURE) ×1 IMPLANT
SYR 10ML LL (SYRINGE) ×3 IMPLANT

## 2018-02-01 NOTE — Anesthesia Post-op Follow-up Note (Signed)
Anesthesia QCDR form completed.        

## 2018-02-01 NOTE — Anesthesia Postprocedure Evaluation (Signed)
Anesthesia Post Note  Patient: Aimee Gutierrez  Procedure(s) Performed: HERNIA REPAIR VENTRAL ADULT (N/A )  Patient location during evaluation: PACU Anesthesia Type: General Level of consciousness: awake and alert and oriented Pain management: pain level controlled Vital Signs Assessment: post-procedure vital signs reviewed and stable Respiratory status: spontaneous breathing Cardiovascular status: blood pressure returned to baseline Anesthetic complications: no     Last Vitals:  Vitals:   02/01/18 1549 02/01/18 1603  BP: 139/76 119/68  Pulse: 70 78  Resp: 17 16  Temp:    SpO2: 95% 96%    Last Pain:  Vitals:   02/01/18 1525  TempSrc:   PainSc: 4                  Aldous Housel

## 2018-02-01 NOTE — Brief Op Note (Signed)
02/01/2018  3:03 PM  PATIENT:  Aimee Gutierrez  84 y.o. female  PRE-OPERATIVE DIAGNOSIS:  INCARCERATED VENTRAL HERNIA  POST-OPERATIVE DIAGNOSIS:  INCARCERATED VENTRAL HERNIA  PROCEDURE:  Procedure(s): HERNIA REPAIR VENTRAL ADULT (N/A)  SURGEON:  Surgeon(s) and Role:    * Herbert Pun, MD - Primary  ANESTHESIA:   general  EBL:  5 mL   BLOOD ADMINISTERED:none  DRAINS: none   LOCAL MEDICATIONS USED:  BUPIVICAINE   COUNTS:  YES  PLAN OF CARE: Discharge to home after PACU  PATIENT DISPOSITION:  PACU - hemodynamically stable.

## 2018-02-01 NOTE — Transfer of Care (Signed)
Immediate Anesthesia Transfer of Care Note  Patient: IONNA AVIS  Procedure(s) Performed: HERNIA REPAIR VENTRAL ADULT (N/A )  Patient Location: PACU  Anesthesia Type:General  Level of Consciousness: awake, oriented and patient cooperative  Airway & Oxygen Therapy: Patient Spontanous Breathing and Patient connected to face mask oxygen  Post-op Assessment: Report given to RN and Post -op Vital signs reviewed and stable  Post vital signs: Reviewed and stable  Last Vitals:  Vitals:   02/01/18 1236 02/01/18 1436  BP: (!) 148/79   Pulse: 63 89  Resp: 20 15  Temp: (!) 36.3 C (!) 36 C  SpO2: 97%     Last Pain:  Vitals:   02/01/18 1236  TempSrc: Temporal         Complications: No apparent anesthesia complications

## 2018-02-01 NOTE — Discharge Instructions (Signed)
°  Diet: Resume home heart healthy regular diet.   Activity: No heavy lifting > 10 pounds (children, pets, laundry, garbage) or strenuous activity until follow-up, but light activity and walking are encouraged. Do not drive or drink alcohol if taking narcotic pain medications.  Wound care: May shower with soapy water and pat dry (do not rub incisions), but no baths or submerging incision underwater until follow-up. (no swimming)   Medications: Resume all home medications. For mild to moderate pain: acetaminophen (Tylenol) or ibuprofen (if no kidney disease). Combining Tylenol with alcohol can substantially increase your risk of causing liver disease. Narcotic pain medications, if prescribed, can be used for severe pain, though may cause nausea, constipation, and drowsiness. Do not combine Tylenol and Percocet within a 6 hour period as Percocet contains Tylenol. If you do not need the narcotic pain medication, you do not need to fill the prescription.  Call office [phone]) at any time if any questions, worsening pain, fevers/chills, bleeding, drainage from incision site, or other concerns.

## 2018-02-01 NOTE — Interval H&P Note (Signed)
History and Physical Interval Note:  02/01/2018 12:41 PM  Aimee Gutierrez  has presented today for surgery, with the diagnosis of INCARCERATED VENTRAL HERNIA  The various methods of treatment have been discussed with the patient and family. After consideration of risks, benefits and other options for treatment, the patient has consented to  Procedure(s): HERNIA REPAIR VENTRAL ADULT (N/A) INSERTION OF MESH (N/A) as a surgical intervention .  The patient's history has been reviewed, patient examined, no change in status, stable for surgery.  I have reviewed the patient's chart and labs.  Correct site marked in the pre procedure room. Questions were answered to the patient's satisfaction.     Herbert Pun

## 2018-02-01 NOTE — Anesthesia Procedure Notes (Signed)
Procedure Name: Intubation Date/Time: 02/01/2018 1:24 PM Performed by: Terrence Dupont, CRNA Pre-anesthesia Checklist: Patient identified, Emergency Drugs available, Suction available, Patient being monitored and Timeout performed Patient Re-evaluated:Patient Re-evaluated prior to induction Oxygen Delivery Method: Circle system utilized Preoxygenation: Pre-oxygenation with 100% oxygen Induction Type: IV induction Ventilation: Mask ventilation without difficulty Laryngoscope Size: Mac and 3 Grade View: Grade I Tube type: Oral Tube size: 7.0 mm Number of attempts: 1 Airway Equipment and Method: Stylet Placement Confirmation: ETT inserted through vocal cords under direct vision,  positive ETCO2 and breath sounds checked- equal and bilateral Secured at: 21 cm Tube secured with: Tape Dental Injury: Teeth and Oropharynx as per pre-operative assessment

## 2018-02-01 NOTE — Anesthesia Preprocedure Evaluation (Signed)
Anesthesia Evaluation  Patient identified by MRN, date of birth, ID band Patient awake    Reviewed: Allergy & Precautions, NPO status , Patient's Chart, lab work & pertinent test results, reviewed documented beta blocker date and time   Airway Mallampati: II  TM Distance: >3 FB     Dental  (+) Chipped   Pulmonary former smoker,           Cardiovascular + Peripheral Vascular Disease       Neuro/Psych    GI/Hepatic GERD  ,  Endo/Other  Hypothyroidism   Renal/GU      Musculoskeletal  (+) Arthritis ,   Abdominal   Peds  Hematology   Anesthesia Other Findings IBS. EKG reviewed and ok. Will use non-latex gloves.  Reproductive/Obstetrics                             Anesthesia Physical  Anesthesia Plan  ASA: III  Anesthesia Plan: General   Post-op Pain Management:    Induction:   PONV Risk Score and Plan:   Airway Management Planned: Oral ETT  Additional Equipment:   Intra-op Plan:   Post-operative Plan: Extubation in OR  Informed Consent: I have reviewed the patients History and Physical, chart, labs and discussed the procedure including the risks, benefits and alternatives for the proposed anesthesia with the patient or authorized representative who has indicated his/her understanding and acceptance.   Dental advisory given  Plan Discussed with: CRNA and Surgeon  Anesthesia Plan Comments:         Anesthesia Quick Evaluation

## 2018-02-01 NOTE — Op Note (Signed)
Preoperative diagnosis: Ventral (epigastric) hernia.  Postoperative diagnosis: Ventral (epigastric) hernia.  Procedure: Ventral hernia repair (Primary)  Anesthesia: GETA  Surgeon: Dr. Windell Moment  Wound Classification: Clean  Indications:Patient is a 84 y.o. female developed a ventral hernia in the epigastric area. This was symptomatic and incarcerated with fat as seen on CT scan and repair was indicated.   Findings: 1. A 79mm x 92mm epigastric hernia defect 2. Abundant amount of fat incarcerated, unable to be reduced. No hollow viscus organ identified during procedure 3. Tension free repair achieved 4. Adequate hemostasis  Description of procedure: The patient was brought to the operating room and general anesthesia was induced. A time-out was completed verifying correct patient, procedure, site, positioning, and implant(s) and/or special equipment prior to beginning this procedure. Antibiotics were administered prior to making the incision. The anterior abdominal wall was prepped and draped in the standard sterile fashion. A vertical midline incision over the palpable hernia content was made. The incision was deepened to the hernia content identifying pre peritoneal fat that was not able to be reduced due to the small size of the hernia defect. The fat was dissected around and deep to its base were the defect border was identified. Since it was not able to be reduced, the fat was divided with electrocautery. Hemostasis achieved.  The fascia was cleaned of fat superficial and deep. No need to enter intraperitoneally.  The anterior fascia was then closed with a figure of 8 Prolene 0 suture on midline.  Dead space approximated with 2-0 Vicryl and the skin was closed with subcuticular sutures of Monocryl 4-0.  The patient tolerated the procedure well and was brought to the postanesthesia care unit in stable condition.   Specimen: Ventral hernia content  Complications: None  Estimated Blood  Loss: 77mL

## 2018-02-02 ENCOUNTER — Encounter: Payer: Self-pay | Admitting: General Surgery

## 2018-02-03 ENCOUNTER — Ambulatory Visit: Payer: Medicare Other

## 2018-02-05 LAB — SURGICAL PATHOLOGY

## 2018-02-08 ENCOUNTER — Ambulatory Visit: Payer: Medicare Other

## 2018-02-09 ENCOUNTER — Ambulatory Visit
Admission: RE | Admit: 2018-02-09 | Discharge: 2018-02-09 | Disposition: A | Discharge status: Home / Self Care | Payer: Medicare Other | Source: Ambulatory Visit | Attending: Family Medicine | Admitting: Family Medicine

## 2018-02-09 DIAGNOSIS — K769 Liver disease, unspecified: Secondary | ICD-10-CM | POA: Diagnosis present

## 2018-02-09 DIAGNOSIS — D1803 Hemangioma of intra-abdominal structures: Secondary | ICD-10-CM | POA: Insufficient documentation

## 2018-02-09 DIAGNOSIS — K439 Ventral hernia without obstruction or gangrene: Secondary | ICD-10-CM | POA: Diagnosis not present

## 2018-02-09 MED ORDER — GADOBENATE DIMEGLUMINE 529 MG/ML IV SOLN
13.0000 mL | Freq: Once | INTRAVENOUS | Status: AC | PRN
  Administered 2018-02-09: 13 mL via INTRAVENOUS

## 2018-03-02 ENCOUNTER — Ambulatory Visit
Admission: RE | Admit: 2018-03-02 | Discharge: 2018-03-02 | Disposition: A | Discharge status: Home / Self Care | Payer: Medicare Other | Source: Ambulatory Visit | Attending: Urgent Care | Admitting: Urgent Care

## 2018-03-02 ENCOUNTER — Other Ambulatory Visit: Payer: Self-pay | Admitting: Urgent Care

## 2018-03-02 DIAGNOSIS — C50912 Malignant neoplasm of unspecified site of left female breast: Secondary | ICD-10-CM

## 2018-03-02 DIAGNOSIS — C50911 Malignant neoplasm of unspecified site of right female breast: Secondary | ICD-10-CM | POA: Diagnosis present

## 2018-03-02 DIAGNOSIS — Z853 Personal history of malignant neoplasm of breast: Secondary | ICD-10-CM

## 2018-03-02 DIAGNOSIS — N63 Unspecified lump in unspecified breast: Secondary | ICD-10-CM

## 2018-03-02 HISTORY — DX: Personal history of irradiation: Z92.3

## 2018-03-05 ENCOUNTER — Telehealth: Payer: Self-pay | Admitting: *Deleted

## 2018-03-05 NOTE — Telephone Encounter (Signed)
  Please keep me updated about her biopsy date.  M

## 2018-03-05 NOTE — Telephone Encounter (Signed)
Patient called to report that [facility] said [facility] will not be able to get her biopsy this week. Patient anxious about having to wait and is asking if she will be OK to wait for the biopsy. I spoke with A Shaver <RN and she will check on this tomorrow morning and return call to patient.

## 2018-03-06 NOTE — Progress Notes (Signed)
  Oncology Nurse Navigator Documentation  Navigator Location: [facility] (03/06/18 0900)   )Navigator Encounter Type: Introductory phone call;Education;Telephone (03/06/18 0900) Telephone: Symptom Mgt;Incoming Call;Outgoing Call;Appt Confirmation/Clarification (03/06/18 0900)                   Patient Visit Type: Initial (03/06/18 0900)       Interventions: Coordination of Care (03/06/18 0900)                      Time Spent with Patient: 30 (03/06/18 0900)   Patient phoned yesterday with concerns about biopsy timing.  Spoke with her this morning, and she is scheduled for 03/13/18.  She is pleased with that appointment. States she has had a knee replacement, and questions  whether she needs to take antibiotics prior to biopsy.  Per radiologist, not necessary to take antibiotics prior to biopsy.

## 2018-03-13 ENCOUNTER — Ambulatory Visit
Admission: RE | Admit: 2018-03-13 | Discharge: 2018-03-13 | Disposition: A | Discharge status: Home / Self Care | Payer: Medicare Other | Source: Ambulatory Visit | Attending: Urgent Care | Admitting: Urgent Care

## 2018-03-13 DIAGNOSIS — N6321 Unspecified lump in the left breast, upper outer quadrant: Secondary | ICD-10-CM | POA: Diagnosis present

## 2018-03-13 DIAGNOSIS — N63 Unspecified lump in unspecified breast: Secondary | ICD-10-CM

## 2018-03-13 DIAGNOSIS — C50412 Malignant neoplasm of upper-outer quadrant of left female breast: Secondary | ICD-10-CM | POA: Insufficient documentation

## 2018-03-13 DIAGNOSIS — Z853 Personal history of malignant neoplasm of breast: Secondary | ICD-10-CM

## 2018-03-13 HISTORY — PX: BREAST BIOPSY: SHX20

## 2018-03-14 ENCOUNTER — Other Ambulatory Visit: Payer: Self-pay | Admitting: Pathology

## 2018-03-15 ENCOUNTER — Ambulatory Visit (INDEPENDENT_AMBULATORY_CARE_PROVIDER_SITE_OTHER): Payer: Medicare Other | Admitting: General Surgery

## 2018-03-15 ENCOUNTER — Encounter: Payer: Self-pay | Admitting: Hematology and Oncology

## 2018-03-15 ENCOUNTER — Ambulatory Visit: Payer: Self-pay

## 2018-03-15 ENCOUNTER — Inpatient Hospital Stay: Payer: Medicare Other | Attending: Hematology and Oncology | Admitting: Hematology and Oncology

## 2018-03-15 ENCOUNTER — Encounter: Payer: Self-pay | Admitting: General Surgery

## 2018-03-15 VITALS — BP 147/65 | HR 82 | Temp 97.7°F | Resp 18 | Ht 66.0 in | Wt 146.2 lb

## 2018-03-15 VITALS — BP 130/70 | HR 74 | Resp 16 | Ht 64.0 in | Wt 146.0 lb

## 2018-03-15 DIAGNOSIS — Z79899 Other long term (current) drug therapy: Secondary | ICD-10-CM | POA: Diagnosis not present

## 2018-03-15 DIAGNOSIS — C50412 Malignant neoplasm of upper-outer quadrant of left female breast: Secondary | ICD-10-CM | POA: Diagnosis not present

## 2018-03-15 DIAGNOSIS — C50912 Malignant neoplasm of unspecified site of left female breast: Secondary | ICD-10-CM

## 2018-03-15 DIAGNOSIS — Z170 Estrogen receptor positive status [ER+]: Secondary | ICD-10-CM | POA: Diagnosis not present

## 2018-03-15 DIAGNOSIS — M25562 Pain in left knee: Secondary | ICD-10-CM | POA: Diagnosis not present

## 2018-03-15 DIAGNOSIS — M810 Age-related osteoporosis without current pathological fracture: Secondary | ICD-10-CM | POA: Insufficient documentation

## 2018-03-15 DIAGNOSIS — K219 Gastro-esophageal reflux disease without esophagitis: Secondary | ICD-10-CM | POA: Insufficient documentation

## 2018-03-15 DIAGNOSIS — K589 Irritable bowel syndrome without diarrhea: Secondary | ICD-10-CM | POA: Diagnosis not present

## 2018-03-15 DIAGNOSIS — Z87891 Personal history of nicotine dependence: Secondary | ICD-10-CM | POA: Diagnosis not present

## 2018-03-15 DIAGNOSIS — N6320 Unspecified lump in the left breast, unspecified quadrant: Secondary | ICD-10-CM | POA: Insufficient documentation

## 2018-03-15 DIAGNOSIS — Z803 Family history of malignant neoplasm of breast: Secondary | ICD-10-CM | POA: Insufficient documentation

## 2018-03-15 DIAGNOSIS — Z7189 Other specified counseling: Secondary | ICD-10-CM

## 2018-03-15 DIAGNOSIS — Z923 Personal history of irradiation: Secondary | ICD-10-CM | POA: Insufficient documentation

## 2018-03-15 DIAGNOSIS — M1990 Unspecified osteoarthritis, unspecified site: Secondary | ICD-10-CM | POA: Insufficient documentation

## 2018-03-15 DIAGNOSIS — K5730 Diverticulosis of large intestine without perforation or abscess without bleeding: Secondary | ICD-10-CM

## 2018-03-15 DIAGNOSIS — Z86000 Personal history of in-situ neoplasm of breast: Secondary | ICD-10-CM

## 2018-03-15 DIAGNOSIS — K769 Liver disease, unspecified: Secondary | ICD-10-CM | POA: Insufficient documentation

## 2018-03-15 DIAGNOSIS — Z853 Personal history of malignant neoplasm of breast: Secondary | ICD-10-CM | POA: Diagnosis not present

## 2018-03-15 DIAGNOSIS — D693 Immune thrombocytopenic purpura: Secondary | ICD-10-CM | POA: Insufficient documentation

## 2018-03-15 DIAGNOSIS — C50911 Malignant neoplasm of unspecified site of right female breast: Secondary | ICD-10-CM

## 2018-03-15 DIAGNOSIS — Z7982 Long term (current) use of aspirin: Secondary | ICD-10-CM | POA: Diagnosis not present

## 2018-03-15 DIAGNOSIS — E039 Hypothyroidism, unspecified: Secondary | ICD-10-CM

## 2018-03-15 NOTE — Patient Instructions (Addendum)
The patient is aware to call back for any questions or new concerns.  

## 2018-03-15 NOTE — Progress Notes (Signed)
Patient return to clinic today regarding breast cancer.

## 2018-03-15 NOTE — Progress Notes (Signed)
Patient ID: Aimee Gutierrez, female   DOB: [birth year], 84 y.o.   MRN: [id number]  Chief Complaint  Patient presents with  . Other    HPI Aimee Gutierrez is a 84 y.o. female who presents for a breast evaluation referred by Dr Mike Gip. The most recent mammogram was done on 02/21/2018 and left breast biopsy done on 03/13/2018.   Patient does perform regular self breast checks and gets regular mammograms done.   Aimee Gutierrez is present at visit.  She states she has lost her husband, son and a daughter since last year.   HPI  Past Medical History:  Diagnosis Date  . Arthritis   . Breast cancer ([place]) 03/09/15   right breast, radiation  . Breast cancer ([place]) 2013   left breast, radiation  . Breast cancer of lower-outer quadrant of right female breast ([place]) 03/09/2015   3.5 mm invasive carcinoma, low-grade DCIS. ER 90%, PR greater than 50%, HER-2/neu not amplified. Axillary sentinel node and radiation deferred based on age..  . Cancer ([place]) 2013   with radiation, stage 1, left breast  . Environmental allergies   . GERD (gastroesophageal reflux disease)   . Hypothyroidism   . IBS (irritable bowel syndrome)    in the past, diverticulitis in the past.  . Osteoporosis   . Personal history of radiation therapy 2016  . Personal history of radiation therapy 2013    Past Surgical History:  Procedure Laterality Date  . BREAST BIOPSY Right 02/17/15   positive  . BREAST BIOPSY Left 03/13/2018   [place] core bx, INVASIVE MAMMARY CARCINOMA  . BREAST LUMPECTOMY Right 2016  . BREAST LUMPECTOMY Left 2013  . BREAST SURGERY Left 2013   [place] Right 03/09/15   wide excision  . CATARACT EXTRACTION  2013, 2015  . OVARY SURGERY  2001   benign  . TOTAL KNEE ARTHROPLASTY Right 10/12/2016   Procedure: TOTAL KNEE ARTHROPLASTY;  Surgeon: Dereck Leep, MD;  Location: [facility] ORS;  Service: Orthopedics;  Laterality: Right;  . VENTRAL HERNIA REPAIR N/A 02/01/2018   Procedure: HERNIA  REPAIR VENTRAL ADULT;  Surgeon: Herbert Pun, MD;  Location: [facility] ORS;  Service: General;  Laterality: N/A;    Family History  Problem Relation Age of Onset  . Arthritis Mother   . Heart disease Father   . Alzheimer's disease Sister   . Breast cancer Paternal Grandmother     Social History Social History   Tobacco Use  . Smoking status: Former Smoker    Last attempt to quit: 12/05/2009    Years since quitting: 8.2  . Smokeless tobacco: Never Used  Substance Use Topics  . Alcohol use: No    Alcohol/week: 0.0 oz    Comment: rare glass of wine  . Drug use: No    Allergies  Allergen Reactions  . Nitrofurantoin Nausea Only  . Latex Rash    Latex IgE was NEGATIVE (<0.10)  . Levothyroxine Nausea Only    Shaking, nausea, hypertension-can tolerate Synthroid  . Sulfa Antibiotics Rash    Current Outpatient Medications  Medication Sig Dispense Refill  . aspirin EC 81 MG tablet Take 81 mg by mouth at bedtime as needed (light headed, dizziness).    . calcium carbonate (TUMS EX) 750 MG chewable tablet Chew 2 tablets by mouth daily.    . Cholecalciferol (VITAMIN D) 2000 units tablet Take 4,000 Units by mouth daily.    Marland Kitchen dextromethorphan (DELSYM) 30 MG/5ML liquid Take 30 mg  by mouth as needed for cough.    . Menthol, Topical Analgesic, (BLUE-EMU MAXIMUM STRENGTH EX) Apply 1 application topically daily as needed (knee pain).    . naproxen sodium (ALEVE) 220 MG tablet Take 220 mg by mouth daily as needed (pain).     . Omeprazole (PRILOSEC PO) Take by mouth 3 times/day as needed-between meals & bedtime.    . Probiotic Product (PROBIOTIC PO) Take by mouth.    . SYNTHROID 88 MCG tablet Take 88 mcg by mouth daily.     No current facility-administered medications for this visit.     Review of Systems Review of Systems  Constitutional: Negative.   Respiratory: Negative.   Cardiovascular: Negative.     Blood pressure 130/70, pulse 74, resp. rate 16, height _0  (1.626 m),  weight 146 lb (66.2 kg).  Physical Exam Physical Exam  Constitutional: She is oriented to person, place, and time. She appears well-developed and well-nourished.  HENT:  Mouth/Throat: Oropharynx is clear and moist.  Eyes: Conjunctivae are normal. No scleral icterus.  Neck: Neck supple.  Cardiovascular: Normal rate, regular rhythm and normal heart sounds.  Pulmonary/Chest: Effort normal and breath sounds normal. Right breast exhibits no inverted nipple, no mass, no nipple discharge, no skin change and no tenderness. Left breast exhibits no inverted nipple, no mass, no nipple discharge, no skin change and no tenderness.    Lymphadenopathy:    She has no cervical adenopathy.    She has no axillary adenopathy.  Neurological: She is alert and oriented to person, place, and time.  Skin: Skin is warm and dry.  Psychiatric: Her behavior is normal.    Data Reviewed March 13, 2018 biopsy results reviewed: DIAGNOSIS:  A. LEFT BREAST, 3:00, 3 CMFN; ULTRASOUND-GUIDED BIOPSY:  - INVASIVE MAMMARY CARCINOMA, NO SPECIAL TYPE.   Size of invasive carcinoma: 5 mm in this sample  Histologic grade of invasive carcinoma: Grade 2            Glandular/tubular differentiation score: 3            Nuclear pleomorphism score: 2            Mitotic rate score: 1            Total score: 6  Ductal carcinoma in situ: Present  Lymphovascular invasion: Not identified  2018 and 2019 mammograms reviewed.  Ultrasound prior to biopsy of March 8 and biopsy date of March 02, 2018 reviewed.  Post.  Biopsy breast imaging films reviewed.  New far posterior lesion in the lateral aspect of the left breast.  Ultrasound examination was undertaken to determine if it would be possible to complete a wide excision and consider accelerated partial breast radiation.  There is a irregular hypoechoic mass measuring 0.56 x 0.75 x 0.77 cm in the left breast at the 3 o'clock position, 3 cm  from the nipple.  This corresponds in size and location to the prebiopsy imaging.  There does not appear to be adequate spacing to allow for accelerated partial radiation.  BI-RADS-6.  Assessment    New left breast cancer in a breast previously treated with wide excision and whole breast radiation.    Plan  Options for management were reviewed in detail including wide excision alone, with antiestrogen therapy if appropriate (declined in 2016 as she was caring for her husband) wide excision with a potential tailored external beam radiation to the area and mastectomy.  The pros and cons of each of these were reviewed in  detail.  Wide excision alone even with antiestrogen therapy would likely put her at a 15% local recurrence rate of 10 years.  Mastectomy would lower this to approximately 1-2%.  The patient is a candidate for sentinel node biopsy.  This is likely a T1b lesion based on imaging, and pros and cons of Oncotype DX/Mammoprint testing will be discussed with the patient by medical oncology.  At this time, she is leaning towards mastectomy.  She has an appointment on March 29, 2018 with Skip Estimable, MD from orthopedics, planning on a potential knee replacement in the future.  I think she could get her surgery done either before or after her upcoming appointment with Dr. Marry Guan anticipating a typical 6-8-week lag time between the decision for knee replacement and making it to the operating room schedule.  The patient was given several dates and encouraged to review her options with her family at her leisure, and notify the office of how she would like to proceed.  Patient's daughter, Aimee Gutierrez, participated by phone for the discussion after exam.   HPI, Physical Exam, Assessment and Plan have been scribed under the direction and in the presence of Aimee Bellow, MD. Aimee Fetch, RN  I have completed the exam and reviewed the above documentation for accuracy and completeness.  I agree  with the above.  Haematologist has been used and any errors in dictation or transcription are unintentional.  Hervey Ard, M.D., F.A.C.S.   Forest Gleason Leesha Veno 03/15/2018, 8:35 PM

## 2018-03-15 NOTE — Progress Notes (Signed)
[facility] day:  03/15/2018   Chief Complaint: Aimee Gutierrez is a 84 y.o. female with a history of bilateral breast cancer who is seen for a review of interval mammogram and breast biopsy.   HPI: The patient was last seen in the medical oncology clinic on 10/13/2017.  At that time, patient has been doing well.  She complained of a "roll" in her abdomen that was  enlarging.  Patient complained of a sensation of "fullness" in her breast.  She denied other complaints.  There were no B symptoms or interval infections. Exam revealed fibrocystic changes in the RIGHT breast.   She was scheduled for bilateral mammogram on 02/21/2018.    Abdominal ultrasound on 01/18/2018 demonstrated no acute process or explanation for epigastric pain.  There was an incidental finding made of a 1.1 cm hyperechoic right liver lobe lesion, felt to be most consistent with a hemangioma.  Recommended MRI if history warranted further evaluation, or follow-up ultrasound in 6 months was recommended to confirm stability.  Abdomen and pelvic CT with contrast on 01/30/2018 revealed a 1 cm lesion within the posterior right lobe of the liver possibly a hemangioma.  There was a 1.4 cm lesion dome of the liver inadequately assessed.  Given her history of breast cancer, a dedicated hepatic MRI was recommended.  There was sigmoid diverticulosis with prominent muscular hypertrophy.  Trace free fluid in the right aspect of the pelvis of indeterminate etiology was noted.  Small bowel loops that entered the right inguinal canal that did not cause proximal obstruction.   Patient underwent ventral hernia repair with Dr. Zachery Dauer on 02/01/2018.  Liver MRI on 02/09/2018 that confirmed that the previously demonstrated hepatic lesions were hemangiomas.  There was no evidence of metastatic breast cancer.  There was an enlarging fat-containing ventral hernia that appeared inflamed.  There was questionable  incarceration.  Diagnostic mammogram on 03/02/2018 revealed scattered areas of fibroglandular density.  Targeted ultrasound demonstrated a 6 x 8 x 8 mm irregular hypoechoic LEFT breast mass at the 3 o'clock position, located 3 cm from the nipple.  Breast biopsy was recommended.   LEFT breast biopsy on 03/13/2018 revealed a grade II invasive mammary carcinoma.  ER, PR and Her2/neu are pending.  Symptomatically, patient is doing well overall.  She has pain in her LEFT knee. She is scheduled to see Dr. Marry Guan later this month for discussions regarding a replacement. Patient complains of "soreness" and marked tenderness in her LEFT breast following her last mammogram and subsequent biopsy. She notes that she did not have as much pain before the mammogram. Patient states, "they just smashed me as hard as they could. They ruined it. It hurts so bad now".  Patient denies an B symptoms or recent infections.  She is eating well. Her weight is up 3 pounds. Patient denies pain in the clinic today.    Past Medical History:  Diagnosis Date  . Arthritis   . Breast cancer ([place]) 03/09/15   right breast, radiation  . Breast cancer ([place]) 2013   left breast, radiation  . Breast cancer of lower-outer quadrant of right female breast ([place]) 03/09/2015   3.5 mm invasive carcinoma, low-grade DCIS. ER 90%, PR greater than 50%, HER-2/neu not amplified. Axillary sentinel node and radiation deferred based on age..  . Cancer ([place]) 2013   with radiation, stage 1, left breast  . Environmental allergies   . GERD (gastroesophageal reflux disease)   .  Hypothyroidism   . IBS (irritable bowel syndrome)    in the past, diverticulitis in the past.  . Osteoporosis   . Personal history of radiation therapy 2016  . Personal history of radiation therapy 2013    Past Surgical History:  Procedure Laterality Date  . BREAST BIOPSY Right 02/17/15   positive  . BREAST BIOPSY Left 04/04/2018   [place] core bx  . BREAST LUMPECTOMY Right  2016  . BREAST LUMPECTOMY Left 2013  . BREAST SURGERY Left 2013   [place] Right 03/09/15   wide excision  . CATARACT EXTRACTION  2013, 2015  . OVARY SURGERY  2001   benign  . TOTAL KNEE ARTHROPLASTY Right 10/12/2016   Procedure: TOTAL KNEE ARTHROPLASTY;  Surgeon: Dereck Leep, MD;  Location: [facility] ORS;  Service: Orthopedics;  Laterality: Right;  . VENTRAL HERNIA REPAIR N/A 02/01/2018   Procedure: HERNIA REPAIR VENTRAL ADULT;  Surgeon: Herbert Pun, MD;  Location: [facility] ORS;  Service: General;  Laterality: N/A;    Family History  Problem Relation Age of Onset  . Arthritis Mother   . Heart disease Father   . Alzheimer's disease Sister   . Breast cancer Paternal Grandmother     Social History:  reports that she quit smoking about 8 years ago. She has never used smokeless tobacco. She reports that she does not drink alcohol or use drugs.  He son died on March 22, 2016 and her husband died on Apr 05, 2016.  The patient is accompanied by her friend, Aimee Gutierrez, today. Aimee Gutierrez (daughter) on conference call via phone from [place].   Allergies:  Allergies  Allergen Reactions  . Nitrofurantoin Nausea Only  . Latex Rash    Latex IgE was NEGATIVE (<0.10)  . Levothyroxine Nausea Only    Shaking, nausea, hypertension-can tolerate Synthroid  . Sulfa Antibiotics Rash    Current Medications: Current Outpatient Medications  Medication Sig Dispense Refill  . Cholecalciferol (VITAMIN D) 2000 units tablet Take 4,000 Units by mouth daily.    . naproxen sodium (ALEVE) 220 MG tablet Take 220 mg by mouth daily as needed (pain).     . Probiotic Product (PROBIOTIC PO) Take by mouth.    . SYNTHROID 88 MCG tablet Take 88 mcg by mouth daily.    Marland Kitchen aspirin EC 81 MG tablet Take 81 mg by mouth at bedtime as needed (light headed, dizziness).    . calcium carbonate (TUMS EX) 750 MG chewable tablet Chew 2 tablets by mouth daily.    Marland Kitchen dextromethorphan (DELSYM) 30 MG/5ML liquid  Take 30 mg by mouth as needed for cough.    . Menthol, Topical Analgesic, (BLUE-EMU MAXIMUM STRENGTH EX) Apply 1 application topically daily as needed (knee pain).    . Omeprazole (PRILOSEC PO) Take by mouth 3 times/day as needed-between meals & bedtime.     No current facility-administered medications for this visit.     Review of Systems:  GENERAL:  Feels "good". No fevers or sweats. Weight up 3 pounds. PERFORMANCE STATUS (ECOG):  1 HEENT:  No visual changes, runny nose, sore throat, mouth sores or tenderness. Lungs: No shortness of breath or cough.  No hemoptysis. Cardiac:  No chest pain, palpitations, orthopnea, or PND. Breast:  Sore/tenderness in LEFT breast. Fibrocystic changes in RIGHT breast.  GI:  No nausea, vomiting, diarrhea, constipation, melena or hematochezia. (+) frequent "stomach aches" with certain foods. s/p ventral hernia repair. GU:  No urgency, frequency, dysuria, or hematuria. Musculoskeletal:  No back pain.  Left knee issues.  No muscle tenderness. Extremities:  Arthritis pain in hands.  No swelling. Skin:  No rashes or skin changes. Neuro:  No headache, numbness or weakness, balance or coordination issues. Endocrine:  No diabetes.  Thyroid disease on Synthroid.  No hot flashes or night sweats. Psych:  No mood changes, depression or anxiety. Pain:  No focal pain. Review of systems:  All other systems reviewed and found to be negative.  Physical Exam: Blood pressure (!) 147/65, pulse 82, temperature 97.7 F (36.5 C), temperature source Tympanic, resp. rate 18, height _0  (1.676 m), weight 146 lb 3 oz (66.3 kg). GENERAL:  Well developed, well nourished, woman sitting comfortably in the exam room in no acute distress.  MENTAL STATUS:  Alert and oriented to person, place and time. HEAD:  Short gray hair.  Normocephalic, atraumatic, face symmetric, no Cushingoid features. EYES:  Hazel eyes.  Pupils equal round and reactive to light and accomodation.  No  conjunctivitis or scleral icterus. ENT:  Oropharynx clear without lesion.  Tongue normal. Mucous membranes moist.  NECK:  No murmur appreciated. RESPIRATORY:  Clear to auscultation without rales, wheezes or rhonchi. CARDIOVASCULAR:  Regular rate and rhythm without murmur, rub or gallop. BREAST:  RIGHT breast with fibrocystic changes. LEFT breast exquisitely tender.  No discrete masses, skin changes or nipple discharge.   ABDOMEN:  Soft, non-tender, with active bowel sounds, and no hepatosplenomegaly.  No masses. SKIN:  No rashes, ulcers or lesions. EXTREMITIES: Arthritis changes in hands.  No edema, no skin discoloration or tenderness.  No palpable cords. LYMPH NODES: No palpable cervical, supraclavicular, axillary or inguinal adenopathy  NEUROLOGICAL: Unremarkable. PSYCH:  Appropriate.   Hospital Outpatient Visit on 03/13/2018  Component Date Value Ref Range Status  . SURGICAL PATHOLOGY 03/13/2018    Final                   Value:Surgical Pathology CASE: [id number] PATIENT: Aimee Gutierrez Surgical Pathology Report     SPECIMEN SUBMITTED: A. Breast, left 3 o'clock 3 CMFN  CLINICAL HISTORY: H/O left breast cancer 2013 and right breast cancer 2016  PRE-OPERATIVE DIAGNOSIS: Suspect invasive breast cancer  POST-OPERATIVE DIAGNOSIS: None provided.     DIAGNOSIS: A.  LEFT BREAST, 3:00, 3 CMFN; ULTRASOUND-GUIDED BIOPSY: - INVASIVE MAMMARY CARCINOMA, NO SPECIAL TYPE.  Size of invasive carcinoma: 5 mm in this sample Histologic grade of invasive carcinoma: Grade 2                      Glandular/tubular differentiation score: 3                      Nuclear pleomorphism score: 2                      Mitotic rate score: 1                      Total score: 6 Ductal carcinoma in situ: Present Lymphovascular invasion: Not identified  ER/PR/HER2: Immunohistochemistry will be performed on block A1, with reflex to [place] for HER2 2+. The results will be reported in an  addendum.  Comment: The d                         efinitive grade will be assigned on the excisional specimen. The diagnosis was called to [place] [facility] [place] on 03/14/18 at 415 PM.  GROSS DESCRIPTION: A. Labeled: Left breast 3:00, 3 cm from nipple Received: In formalin Time/date in fixative: 1:35 PM on 03/13/2018 Cold ischemic time: Less than 5 minutes Total fixation time: 7 hours Core pieces: 3 Size: 0.6-1.2 cm in length and 0.2 cm in diameter Description: Yellow to pink fibrofatty tissue Ink color: Green Entirely submitted in one cassette.     Final Diagnosis performed by Delorse Lek, MD.   Electronically signed 03/14/2018 4:22:22PM The electronic signature indicates that the named Attending Pathologist has evaluated the specimen  Technical component performed at [facility], [address] [place], [place] [address] Lab: [phone] Dir: Rush Farmer, MD, MMM  Professional component performed at [facility], [facility], [place], [place], [place] [address] Lab: [place] Dir: Dellia Nims. Reuel Derby, MD     Assessment:  Aimee Gutierrez is a 84 y.o. female with a history of bilateral breast cancer and chronic mild thrombocytopenia..   She initially presented with stage I left breast cancer s/p lumpectomy and sentinel lymph node biopsy on 03/09/2012. Initial biopsy revealed a 7 mm lesion in the left breast. Pathology revealed a 1 mm focus of residual invasive mammry carcinoma with DCIS. Stage T1aN0M0. Tumor was ER/PR+. Her2/neu was not assessed.  She received radiation from 04/26/2012 to 06/20/2012. She declined hormonal therapy.   Mammogram on 02/13/2014 revealed no evidence of maligancy. Mammogram and ultrasound on 02/16/2015 revealed a 5 x 3 x 2 mm hypoechoic mass at the 4 o'clock position of the right breast. Core biopsy on 02/17/2015 revealed low grade DCIS, cribiform type. Estrogen receptor was positive  (> 90%) and progesterone receptor positive (> 90%).   She underwent wide excision of the right breast on 03/09/2015. Pathology revealed a 3.5 mm focus of grade II invasive carcinoma with ductal carcinoma in situ. Margins were negative. No lymph nodes were removed. Tumor was ER positive (> 90%), PR positive (51-90%) and Her2/neu negative. Pathologic stage was T1aNxMx. She completed radiation on 05/13/2015.  She decided against hormonal therapy.  Bilateral mammogram on 02/21/2017 revealed stable post lumpectomy changes bilaterally.  Diagnostic mammogram on 03/02/2018 revealing scattered areas of fiber glandular density.  Targeted ultrasound was performed that demonstrated a 6 x 8 x 8 mm irregular hypoechoic LEFT breast mass at the 3 o'clock position, located 3 cm from the nipple.    LEFT breast biopsy on 03/13/2018.  Pathology revealed a grade II invasive mammary carcinoma.  ER, PR, and Her2/neu are pending.  CA27.29 has been followed: 24.0 on 04/16/2015, 19.2 on 08/03/2015, 11.8 on 11/10/2015, 14.3 on 04/29/2016, 12.2 on 10/06/2016, 17.7 on 02/23/2017, 13.1 on 05/26/2017, and 17 on 10/13/2017.  Bone density study on 05/25/2015 revealed osteoporosis (T score of -3.3 in the femur neck and -3.5 in the forearm). Bone density on 05/25/2017 revealed osteoporosis with a T-score of -3.4 in the left femoral neck and -3.4 in the left forearm radius.  She is taking calcium and vitamin D.  She is not interested in Prolia or Fosamax; readdressed today and patient remains adamant that she does not wish to taking anything.   She has persistent thrombocytopenia c/w mild chronic ITP.  Platelet count has ranged between 96,000 - 161,000 without trend.   Abdominal ultrasound on 01/18/2018 showed a 1.1 cm hyperechoic right liver lobe lesion, felt to be most consistent with a hemangioma.    CT abdomen and  pelvis on 01/30/2018 that revealed a 1.2 cm defect within the anterior abdominal wall through which  omentum/fat and vessels transverses and appeared inflamed. 1 cm lesion within the posterior right liver lobe again mentioned, and felt to quite possibly a hemangioma.  There was a 1.4 cm lesion at dome of the left lobe of the liver. There was sigmoid diverticulosis with prominent muscular hypertrophy.  Trace free fluid in the right aspect of the pelvis of indeterminate etiology noted.  Small bowel loops that entered the right inguinal canal that did not cause proximal obstruction.  Liver MRI on 02/09/2018 confirmed that the previously demonstrated hepatic lesions were hemangiomas.  There was no evidence of metastatic breast cancer.  There was an enlarging fat-containing ventral hernia that appeared inflamed.  There was questionable incarceration. She underwent ventral hernia repair on 02/01/2018.  Symptomatically, patient has marked tenderness in her LEFT breast.  She has no B symptoms or recent infections.  Exam reveals fibrocystic changes in the RIGHT breast.  Labs unremarkable.   Plan: 1.  Review recent mammogram and breast biopsy - grade II invasive mammary carcinoma (clinical stage I disease).  Discussed recurrence vs. new primary tumor.  2.  Discuss referral to surgery (Dr Bary Castilla) for discussions regarding LEFT mastectomy vs lumpectomy and local radiation.  She has an appointment with Dr Bary Castilla at 4:30 pm this afternoon.  3.  Discuss likely plan for hormonal therapy after surgery.  Patient has declined in the past citing that she "had her reasons".  Patient willing to at least consider hormonal treatment at this point.   4.  RTC after surgery for MD assessment, labs (CBC with diff, CMP, CA27.29), and review of pathology.  Will need to call for an appointment.    Honor Loh, NP 03/15/18, 2:36 PM  I saw and evaluated the patient, participating in the key portions of the service and reviewing pertinent diagnostic studies and records.  I reviewed the nurse practitioner's note and agree with the  findings and the plan.  The assessment and plan were discussed with the patient.  Multiple questions were asked by the patient and answered.   Nolon Stalls, MD 03/15/18,2:36 PM

## 2018-03-16 ENCOUNTER — Other Ambulatory Visit: Payer: Self-pay

## 2018-03-16 ENCOUNTER — Other Ambulatory Visit: Payer: Self-pay | Admitting: General Surgery

## 2018-03-16 ENCOUNTER — Telehealth: Payer: Self-pay | Admitting: General Surgery

## 2018-03-16 ENCOUNTER — Telehealth: Payer: Self-pay

## 2018-03-16 DIAGNOSIS — Z170 Estrogen receptor positive status [ER+]: Principal | ICD-10-CM

## 2018-03-16 DIAGNOSIS — C50412 Malignant neoplasm of upper-outer quadrant of left female breast: Secondary | ICD-10-CM

## 2018-03-16 MED ORDER — LIDOCAINE-PRILOCAINE 2.5-2.5 % EX CREA: 1.0000 "application " | TOPICAL_CREAM | CUTANEOUS | 0 refills | Status: DC | PRN

## 2018-03-16 NOTE — Telephone Encounter (Signed)
The patient called and would like to see about setting up her surgery. She has opted for a left breast mastectomy and would like to have surgery on 03/26/18. She has several questions for Dr Bary Castilla about the type of mastectomy he will be doing and would like for him to call her about this. I have sent a message asking that he call her about this. We will contact the patient once surgery is posted about surgery instructions, pre admit testing, and arrival time and location.

## 2018-03-16 NOTE — Telephone Encounter (Signed)
Patient had questions about her upcoming breast surgery.  She was concerned if muscle was going to be removed.  She was advised that this is going to be a simple mastectomy so the breast, nipple and areola skin will be removed, but no muscle will be disturbed.  We discussed the use of EMLA cream to help make the injections for sentinel node identification more comfortable.  She had not required sentinel node injection with her 2016 cancers, so this portion of the procedure was a little new to her.  Was instructed to use the cream 1 hour before and to cover the area with piece of plastic wrap so it does not get absorbed into her clothing and this will be done to help minimize discomfort during sentinel node injection.  Plans at present are for surgical intervention on March 26, 2018 with a left simple mastectomy and sentinel node biopsy.

## 2018-03-19 ENCOUNTER — Telehealth: Payer: Self-pay

## 2018-03-19 NOTE — Telephone Encounter (Signed)
Spoke with the patient about her surgery for 03/26/18. She will pre admit by phone on 03/20/18. She will arrive at the hospital on 03/26/18 for surgery and report to the Radiology desk at 12:45 pm. Surgery instructions reviewed with the patient and mailed to her home. The patient is aware of date, time, and instructions.

## 2018-03-20 ENCOUNTER — Encounter
Admission: RE | Admit: 2018-03-20 | Discharge: 2018-03-20 | Disposition: A | Discharge status: Home / Self Care | Payer: Medicare Other | Source: Ambulatory Visit | Attending: General Surgery | Admitting: General Surgery

## 2018-03-20 ENCOUNTER — Other Ambulatory Visit: Payer: Self-pay

## 2018-03-20 NOTE — Patient Instructions (Signed)
Your procedure is scheduled on: 03/26/18 12:45 Report to Radiology.   Remember: Instructions that are not followed completely may result in serious medical risk, up to and including death, or upon the discretion of your surgeon and anesthesiologist your surgery may need to be rescheduled.     _X__ 1. Do not eat food after midnight the night before your procedure.                 No gum chewing or hard candies. You may drink clear liquids up to 2 hours                 before you are scheduled to arrive for your surgery- DO not drink clear                 liquids within 2 hours of the start of your surgery.                 Clear Liquids include:  water, apple juice without pulp, clear carbohydrate                 drink such as Clearfast of [place]art[place]ade, Black Coffee or Tea (Do not add                 anything to coffee or tea).  __X__2.  On the morning of surgery brush your teeth with toothpaste and water, you                 may rinse your mouth with mouthwash if you wish.  Do not swallow any              toothpaste of mouthwash.     _X__ 3.  No Alcohol for 24 hours before or after surgery.   _X__ 4.  Do Not Smoke or use e-cigarettes For 24 Hours Prior to Your Surgery.                 Do not use any chewable tobacco products for at least 6 hours prior to                 surgery.  ____  5.  Bring all medications with you on the day of surgery if instructed.   __X__  6.  Notify your doctor if there is any change in your medical condition      (cold, fever, infections).     Do not wear jewelry, make-up, hairpins, clips or nail polish. Do not wear lotions, powders, or perfumes. You may wear deodorant. Do not shave 48 hours prior to surgery. Men may shave face and neck. Do not bring valuables to the hospital.    [facility] is not responsible for any belongings or valuables.  Contacts, dentures or bridgework may not be worn into surgery. Leave your suitcase in the car.  After surgery it may be brought to your room. For patients admitted to the hospital, discharge time is determined by your treatment team.   Patients discharged the day of surgery will not be allowed to drive home.    __X__ Take these medicines the morning of surgery with A SIP OF WATER:    1.OMEPRAZOLE AT BEDTIME 03/25/18 AND AM OF SURGERY  2. SYNTHROID  3.   4.  5.  6.  ____ Fleet Enema (as directed)   __X__ Use CHG Soap as directed    USING LEFT OVER FROM PREVIOUS SURGERY  ____ Use inhalers on the day of surgery  ____  Stop metformin 2 days prior to surgery    ____ Take 1/2 of usual insulin dose the night before surgery. No insulin the morning          of surgery.   ____ Stop Coumadin/Plavix/aspirin on  ____ Stop Anti-inflammatories on   ____ Stop supplements until after surgery.    ____ Bring C-Pap to the hospital.

## 2018-03-20 NOTE — Anesthesia Pain Management Evaluation Note (Signed)
PATIENT WOULD LIKE TO HAVE DR P CARROLL IF CAN BE WORKED OUT.

## 2018-03-21 ENCOUNTER — Other Ambulatory Visit: Payer: Self-pay | Admitting: Pathology

## 2018-03-21 NOTE — Progress Notes (Unsigned)
.  md

## 2018-03-23 LAB — SURGICAL PATHOLOGY

## 2018-03-25 MED ORDER — CEFAZOLIN SODIUM-DEXTROSE 2-4 GM/100ML-% IV SOLN
2.0000 g | INTRAVENOUS | Status: AC
  Filled 2018-03-25: qty 100

## 2018-03-26 ENCOUNTER — Ambulatory Visit
Admission: RE | Admit: 2018-03-26 | Discharge: 2018-03-26 | Disposition: A | Discharge status: Home / Self Care | Payer: Medicare Other | Source: Ambulatory Visit | Attending: General Surgery | Admitting: General Surgery

## 2018-03-26 ENCOUNTER — Encounter
Admission: RE | Disposition: A | Discharge status: Home / Self Care | Payer: Self-pay | Source: Ambulatory Visit | Attending: General Surgery

## 2018-03-26 ENCOUNTER — Ambulatory Visit: Payer: Medicare Other | Admitting: Anesthesiology

## 2018-03-26 ENCOUNTER — Encounter: Payer: Self-pay | Admitting: Diagnostic Radiology

## 2018-03-26 DIAGNOSIS — C50412 Malignant neoplasm of upper-outer quadrant of left female breast: Secondary | ICD-10-CM | POA: Diagnosis not present

## 2018-03-26 DIAGNOSIS — Z87891 Personal history of nicotine dependence: Secondary | ICD-10-CM | POA: Diagnosis not present

## 2018-03-26 DIAGNOSIS — Z79899 Other long term (current) drug therapy: Secondary | ICD-10-CM | POA: Diagnosis not present

## 2018-03-26 DIAGNOSIS — K219 Gastro-esophageal reflux disease without esophagitis: Secondary | ICD-10-CM | POA: Diagnosis not present

## 2018-03-26 DIAGNOSIS — Z7982 Long term (current) use of aspirin: Secondary | ICD-10-CM | POA: Insufficient documentation

## 2018-03-26 DIAGNOSIS — C50912 Malignant neoplasm of unspecified site of left female breast: Secondary | ICD-10-CM

## 2018-03-26 DIAGNOSIS — Z170 Estrogen receptor positive status [ER+]: Secondary | ICD-10-CM

## 2018-03-26 DIAGNOSIS — K589 Irritable bowel syndrome without diarrhea: Secondary | ICD-10-CM | POA: Diagnosis not present

## 2018-03-26 DIAGNOSIS — E039 Hypothyroidism, unspecified: Secondary | ICD-10-CM | POA: Diagnosis not present

## 2018-03-26 HISTORY — PX: SENTINEL NODE BIOPSY: SHX6608

## 2018-03-26 HISTORY — PX: SIMPLE MASTECTOMY WITH AXILLARY SENTINEL NODE BIOPSY: SHX6098

## 2018-03-26 HISTORY — DX: Malignant neoplasm of unspecified site of left female breast: C50.912

## 2018-03-26 SURGERY — SIMPLE MASTECTOMY
Anesthesia: General | Laterality: Left | Wound class: Clean

## 2018-03-26 MED ORDER — MIDAZOLAM HCL 2 MG/2ML IJ SOLN
INTRAMUSCULAR | Status: AC
  Filled 2018-03-26: qty 2

## 2018-03-26 MED ORDER — CEFAZOLIN SODIUM-DEXTROSE 2-3 GM-%(50ML) IV SOLR
INTRAVENOUS | Status: AC
  Filled 2018-03-26: qty 50

## 2018-03-26 MED ORDER — LACTATED RINGERS IV SOLN
INTRAVENOUS | Status: DC
  Administered 2018-03-26: 14:00:00 via INTRAVENOUS

## 2018-03-26 MED ORDER — FENTANYL CITRATE (PF) 100 MCG/2ML IJ SOLN
INTRAMUSCULAR | Status: AC
  Filled 2018-03-26: qty 2

## 2018-03-26 MED ORDER — ONDANSETRON HCL 4 MG/2ML IJ SOLN
INTRAMUSCULAR | Status: DC | PRN
  Administered 2018-03-26: 4 mg via INTRAVENOUS

## 2018-03-26 MED ORDER — FENTANYL CITRATE (PF) 100 MCG/2ML IJ SOLN
INTRAMUSCULAR | Status: AC
  Administered 2018-03-26: 25 ug via INTRAVENOUS
  Filled 2018-03-26: qty 2

## 2018-03-26 MED ORDER — PROPOFOL 10 MG/ML IV BOLUS
INTRAVENOUS | Status: DC | PRN
  Administered 2018-03-26: 80 mg via INTRAVENOUS

## 2018-03-26 MED ORDER — ACETAMINOPHEN 10 MG/ML IV SOLN
INTRAVENOUS | Status: AC
Start: 2018-03-26 — End: ?
  Filled 2018-03-26: qty 100

## 2018-03-26 MED ORDER — MIDAZOLAM HCL 2 MG/2ML IJ SOLN
INTRAMUSCULAR | Status: DC | PRN
  Administered 2018-03-26 (×2): .5 mg via INTRAVENOUS

## 2018-03-26 MED ORDER — PROPOFOL 10 MG/ML IV BOLUS
INTRAVENOUS | Status: AC
  Filled 2018-03-26: qty 20

## 2018-03-26 MED ORDER — DEXAMETHASONE SODIUM PHOSPHATE 10 MG/ML IJ SOLN
INTRAMUSCULAR | Status: AC
  Filled 2018-03-26: qty 1

## 2018-03-26 MED ORDER — LIDOCAINE HCL (CARDIAC) PF 100 MG/5ML IV SOSY
PREFILLED_SYRINGE | INTRAVENOUS | Status: DC | PRN
  Administered 2018-03-26: 80 mg via INTRAVENOUS

## 2018-03-26 MED ORDER — DEXAMETHASONE SODIUM PHOSPHATE 10 MG/ML IJ SOLN
INTRAMUSCULAR | Status: DC | PRN
  Administered 2018-03-26: 5 mg via INTRAVENOUS

## 2018-03-26 MED ORDER — EPHEDRINE SULFATE 50 MG/ML IJ SOLN
INTRAMUSCULAR | Status: DC | PRN
  Administered 2018-03-26: 10 mg via INTRAVENOUS

## 2018-03-26 MED ORDER — HYDROCODONE-ACETAMINOPHEN 5-325 MG PO TABS
ORAL_TABLET | ORAL | Status: AC
  Filled 2018-03-26: qty 1

## 2018-03-26 MED ORDER — HYDROCODONE-ACETAMINOPHEN 5-325 MG PO TABS
1.0000 | ORAL_TABLET | ORAL | Status: DC | PRN
  Administered 2018-03-26: 1 via ORAL

## 2018-03-26 MED ORDER — KETOROLAC TROMETHAMINE 30 MG/ML IJ SOLN
INTRAMUSCULAR | Status: AC
  Filled 2018-03-26: qty 1

## 2018-03-26 MED ORDER — FENTANYL CITRATE (PF) 100 MCG/2ML IJ SOLN
INTRAMUSCULAR | Status: DC | PRN
  Administered 2018-03-26 (×4): 25 ug via INTRAVENOUS

## 2018-03-26 MED ORDER — LIDOCAINE HCL (PF) 2 % IJ SOLN
INTRAMUSCULAR | Status: AC
  Filled 2018-03-26: qty 10

## 2018-03-26 MED ORDER — ONDANSETRON HCL 4 MG/2ML IJ SOLN
INTRAMUSCULAR | Status: AC
  Filled 2018-03-26: qty 4

## 2018-03-26 MED ORDER — METHYLENE BLUE 1 % INJ SOLN
INTRAMUSCULAR | Status: AC
  Filled 2018-03-26: qty 10

## 2018-03-26 MED ORDER — FENTANYL CITRATE (PF) 100 MCG/2ML IJ SOLN
25.0000 ug | INTRAMUSCULAR | Status: DC | PRN
  Administered 2018-03-26 (×2): 25 ug via INTRAVENOUS

## 2018-03-26 MED ORDER — ONDANSETRON HCL 4 MG/2ML IJ SOLN: 4.0000 mg | Freq: Once | INTRAMUSCULAR | Status: DC | PRN

## 2018-03-26 MED ORDER — CEFAZOLIN SODIUM-DEXTROSE 2-3 GM-%(50ML) IV SOLR
INTRAVENOUS | Status: DC | PRN
  Administered 2018-03-26: 2 g via INTRAVENOUS

## 2018-03-26 MED ORDER — TECHNETIUM TC 99M SULFUR COLLOID FILTERED
0.7550 | Freq: Once | INTRAVENOUS | Status: AC | PRN
  Administered 2018-03-26: 0.755 via INTRADERMAL

## 2018-03-26 MED ORDER — HYDROCODONE-ACETAMINOPHEN 5-325 MG PO TABS: 1.0000 | ORAL_TABLET | ORAL | 0 refills | Status: DC | PRN

## 2018-03-26 MED ORDER — METHYLENE BLUE 0.5 % INJ SOLN
INTRAVENOUS | Status: DC | PRN
  Administered 2018-03-26: 5 mL via SUBMUCOSAL

## 2018-03-26 MED ORDER — ACETAMINOPHEN 10 MG/ML IV SOLN
INTRAVENOUS | Status: DC | PRN
  Administered 2018-03-26: 1000 mg via INTRAVENOUS

## 2018-03-26 SURGICAL SUPPLY — 59 items
APPLIER CLIP 11 MED OPEN (CLIP)
APPLIER CLIP 13 LRG OPEN (CLIP)
BINDER BREAST LRG (GAUZE/BANDAGES/DRESSINGS) IMPLANT
BINDER BREAST MEDIUM (GAUZE/BANDAGES/DRESSINGS) IMPLANT
BINDER BREAST XLRG (GAUZE/BANDAGES/DRESSINGS) IMPLANT
BINDER BREAST XXLRG (GAUZE/BANDAGES/DRESSINGS) IMPLANT
BLADE PHOTON ILLUMINATED (MISCELLANEOUS) IMPLANT
BLADE SURG 15 STRL SS SAFETY (BLADE) ×3 IMPLANT
BULB RESERV EVAC DRAIN JP 100C (MISCELLANEOUS) ×3 IMPLANT
CANISTER SUCT 1200ML W/VALVE (MISCELLANEOUS) ×3 IMPLANT
CHLORAPREP W/TINT 26ML (MISCELLANEOUS) ×3 IMPLANT
CLIP APPLIE 11 MED OPEN (CLIP) IMPLANT
CLIP APPLIE 13 LRG OPEN (CLIP) IMPLANT
CLOSURE WOUND 1/2 X4 (GAUZE/BANDAGES/DRESSINGS) ×2
CNTNR SPEC 2.5X3XGRAD LEK (MISCELLANEOUS) ×3
CONT SPEC 4OZ STER OR WHT (MISCELLANEOUS) ×6
CONTAINER SPEC 2.5X3XGRAD LEK (MISCELLANEOUS) ×3 IMPLANT
COVER PROBE FLX POLY STRL (MISCELLANEOUS) ×3 IMPLANT
DEVICE DUBIN SPECIMEN MAMMOGRA (MISCELLANEOUS) IMPLANT
DRAIN CHANNEL JP 15F RND 16 (MISCELLANEOUS) ×3 IMPLANT
DRAPE LAPAROTOMY TRNSV 106X77 (MISCELLANEOUS) ×3 IMPLANT
DRSG GAUZE FLUFF 36X18 (GAUZE/BANDAGES/DRESSINGS) ×6 IMPLANT
DRSG OPSITE POSTOP 4X10 (GAUZE/BANDAGES/DRESSINGS) ×3 IMPLANT
DRSG TEGADERM 4X4.75 (GAUZE/BANDAGES/DRESSINGS) ×3 IMPLANT
DRSG TELFA 3X8 NADH (GAUZE/BANDAGES/DRESSINGS) ×3 IMPLANT
ELECT CAUTERY BLADE TIP 2.5 (TIP) ×3
ELECT REM PT RETURN 9FT ADLT (ELECTROSURGICAL) ×3
ELECTRODE CAUTERY BLDE TIP 2.5 (TIP) ×1 IMPLANT
ELECTRODE REM PT RTRN 9FT ADLT (ELECTROSURGICAL) ×1 IMPLANT
GAUZE SPONGE 4X4 12PLY STRL (GAUZE/BANDAGES/DRESSINGS) ×3 IMPLANT
GLOVE BIO SURGEON STRL SZ7.5 (GLOVE) ×3 IMPLANT
GLOVE INDICATOR 8.0 STRL GRN (GLOVE) ×3 IMPLANT
GOWN STRL REUS W/ TWL LRG LVL3 (GOWN DISPOSABLE) ×2 IMPLANT
GOWN STRL REUS W/TWL LRG LVL3 (GOWN DISPOSABLE) ×4
KIT TURNOVER KIT A (KITS) ×3 IMPLANT
LABEL OR SOLS (LABEL) ×3 IMPLANT
NDL SAFETY ECLIPSE 18X1.5 (NEEDLE) ×1 IMPLANT
NEEDLE HYPO 18GX1.5 SHARP (NEEDLE) ×2
NEEDLE HYPO 22GX1.5 SAFETY (NEEDLE) ×3 IMPLANT
PACK BASIN MINOR ARMC (MISCELLANEOUS) ×3 IMPLANT
PIN SAFETY STRL (MISCELLANEOUS) ×3 IMPLANT
RETRACTOR RING XSMALL (MISCELLANEOUS) IMPLANT
RTRCTR WOUND ALEXIS 13CM XS SH (MISCELLANEOUS)
SHEARS FOC LG CVD HARMONIC 17C (MISCELLANEOUS) IMPLANT
SLEVE PROBE SENORX GAMMA FIND (MISCELLANEOUS) ×3 IMPLANT
SPONGE LAP 18X18 5 PK (GAUZE/BANDAGES/DRESSINGS) ×3 IMPLANT
STRIP CLOSURE SKIN 1/2X4 (GAUZE/BANDAGES/DRESSINGS) ×4 IMPLANT
SUT ETHILON 3-0 FS-10 30 BLK (SUTURE) ×3
SUT SILK 0 (SUTURE) ×2
SUT SILK 0 30XBRD TIE 6 (SUTURE) ×1 IMPLANT
SUT VIC AB 2-0 CT1 27 (SUTURE) ×6
SUT VIC AB 2-0 CT1 TAPERPNT 27 (SUTURE) ×3 IMPLANT
SUT VIC AB 3-0 SH 27 (SUTURE) ×2
SUT VIC AB 3-0 SH 27X BRD (SUTURE) ×1 IMPLANT
SUT VICRYL+ 3-0 144IN (SUTURE) ×3 IMPLANT
SUTURE EHLN 3-0 FS-10 30 BLK (SUTURE) ×1 IMPLANT
SWABSTK COMLB BENZOIN TINCTURE (MISCELLANEOUS) ×3 IMPLANT
SYR 10ML LL (SYRINGE) ×3 IMPLANT
TAPE TRANSPORE STRL 2 31045 (GAUZE/BANDAGES/DRESSINGS) ×3 IMPLANT

## 2018-03-26 NOTE — OR Nursing (Signed)
Ok to d/c to home per Dr. Bary Castilla via tele @ 1725.

## 2018-03-26 NOTE — Discharge Instructions (Signed)

## 2018-03-26 NOTE — Anesthesia Postprocedure Evaluation (Signed)
Anesthesia Post Note  Patient: Aimee Gutierrez  Procedure(s) Performed: SIMPLE MASTECTOMY (Left ) SENTINEL NODE BIOPSY (Left )  Patient location during evaluation: PACU Anesthesia Type: General Level of consciousness: awake and alert Pain management: pain level controlled Vital Signs Assessment: post-procedure vital signs reviewed and stable Respiratory status: spontaneous breathing, nonlabored ventilation, respiratory function stable and patient connected to nasal cannula oxygen Cardiovascular status: blood pressure returned to baseline and stable Postop Assessment: no apparent nausea or vomiting Anesthetic complications: no     Last Vitals:  Vitals:   03/26/18 1605 03/26/18 1610  BP:  135/71  Pulse: 80 83  Resp: 15 19  Temp:  36.6 C  SpO2: 99% 97%    Last Pain:  Vitals:   03/26/18 1605  PainSc: 3                  Broadus John K Piscitello

## 2018-03-26 NOTE — H&P (Signed)
84 y/o with recurrent left breast cancer. Has decided to proceed to simple mastectomy. No health issues since last office visit. For left mastectomy and SLN biopsy.

## 2018-03-26 NOTE — Anesthesia Procedure Notes (Signed)
Procedure Name: LMA Insertion Date/Time: 03/26/2018 2:13 PM Performed by: Dawayne Cirri I, CRNA Pre-anesthesia Checklist: Patient identified, Patient being monitored, Timeout performed, Emergency Drugs available and Suction available Patient Re-evaluated:Patient Re-evaluated prior to induction Oxygen Delivery Method: Circle system utilized Preoxygenation: Pre-oxygenation with 100% oxygen Induction Type: IV induction Ventilation: Mask ventilation without difficulty LMA: LMA inserted LMA Size: 4.0 Tube type: Oral Number of attempts: 1 Placement Confirmation: positive ETCO2 and breath sounds checked- equal and bilateral Tube secured with: Tape Dental Injury: Teeth and Oropharynx as per pre-operative assessment

## 2018-03-26 NOTE — Anesthesia Preprocedure Evaluation (Signed)
Anesthesia Evaluation  Patient identified by MRN, date of birth, ID band Patient awake    Reviewed: Allergy & Precautions, NPO status , Patient's Chart, lab work & pertinent test results  History of Anesthesia Complications Negative for: history of anesthetic complications  Airway Mallampati: II       Dental   Pulmonary neg sleep apnea, neg COPD, former smoker,           Cardiovascular (-) hypertension+ Peripheral Vascular Disease  (-) CHF (-) dysrhythmias (-) Valvular Problems/Murmurs     Neuro/Psych neg Seizures    GI/Hepatic Neg liver ROS, GERD  Medicated and Controlled,  Endo/Other  neg diabetesHypothyroidism   Renal/GU negative Renal ROS     Musculoskeletal   Abdominal   Peds  Hematology   Anesthesia Other Findings   Reproductive/Obstetrics                             Anesthesia Physical Anesthesia Plan  ASA: III  Anesthesia Plan: General   Post-op Pain Management:    Induction:   PONV Risk Score and Plan: 3 and Dexamethasone, Ondansetron, Treatment may vary due to age or medical condition and Midazolam  Airway Management Planned: LMA and Oral ETT  Additional Equipment:   Intra-op Plan:   Post-operative Plan:   Informed Consent: I have reviewed the patients History and Physical, chart, labs and discussed the procedure including the risks, benefits and alternatives for the proposed anesthesia with the patient or authorized representative who has indicated his/her understanding and acceptance.     Plan Discussed with:   Anesthesia Plan Comments:         Anesthesia Quick Evaluation

## 2018-03-26 NOTE — Anesthesia Post-op Follow-up Note (Signed)
Anesthesia QCDR form completed.        

## 2018-03-26 NOTE — Op Note (Signed)
Preoperative diagnosis: Recurrent left breast cancer.  Postoperative diagnosis: Same.  Operative procedure: Left simple mastectomy with sentinel node biopsy.  Operating Surgeon: Hervey Ard, MD.  Anesthesia: General by LMA.  Estimated blood loss: Less than 20 cc.  Clinical note: This 84 year old woman is developed recurrent left breast cancer.  After reviewing her options she desires to proceed to mastectomy.  The patient was injected with technetium sulfur colloid prior to the procedure.  SCD stockings for DVT prevention.  Patient received Kefzol for antibiotic prophylaxis.  Operative note: With the patient under adequate general anesthesia the breast was cleansed with ChloraPrep after the injection of 5 cc of methylene blue, 0.5%, in the retroareolar plexus.  Elliptical incision was outlined in the skin incised sharply.  The remaining dissection was completed using the photon blade.  Flaps were elevated to the sternum medially, 1 cm below the clavicle superiorly, serratus muscle laterally and rectus fascia inferiorly.  The breast was elevated off the chest wall taking the fascia of the pectoralis muscle with the specimen.  2 indistinct nodes were identified in the axilla and these were sent in formalin for routine histology as sentinel nodes #1 and 2.  Counts were low, no more than 150.  A 15 [place] Blake drain was brought out through the inferior medial flap through a separate stab wound incision.  This was anchored in place with a 3-0 nylon suture.  Good hemostasis was noted.  The flaps were approximated with a running 2-0 Vicryl suture in 2 segments.  Benzoin Steri-Strips were applied.  Telfa and Tegaderm applied to the drain site followed by a honeycomb dressing to the mastectomy site.  Fluff gauze and a compressive wrap was applied.  The patient tolerated the procedure well and was taken to the recovery room in stable condition.

## 2018-03-26 NOTE — Transfer of Care (Signed)
Immediate Anesthesia Transfer of Care Note  Patient: Aimee Gutierrez  Procedure(s) Performed: SIMPLE MASTECTOMY (Left ) SENTINEL NODE BIOPSY (Left )  Patient Location: PACU  Anesthesia Type:General  Level of Consciousness: awake and responds to stimulation  Airway & Oxygen Therapy: Patient Spontanous Breathing and Patient connected to face mask oxygen  Post-op Assessment: Report given to RN and Post -op Vital signs reviewed and stable  Post vital signs: Reviewed and stable  Last Vitals:  Vitals Value Taken Time  BP 139/74 03/26/2018  3:40 PM  Temp    Pulse 80 03/26/2018  3:40 PM  Resp 12 03/26/2018  3:40 PM  SpO2 100 % 03/26/2018  3:40 PM    Last Pain:  Vitals:   03/26/18 1339  PainSc: 0-No pain         Complications: No apparent anesthesia complications

## 2018-03-27 ENCOUNTER — Encounter: Payer: Self-pay | Admitting: General Surgery

## 2018-03-28 ENCOUNTER — Other Ambulatory Visit: Payer: Self-pay | Admitting: Pathology

## 2018-03-28 ENCOUNTER — Ambulatory Visit: Payer: Medicare Other | Admitting: *Deleted

## 2018-03-28 DIAGNOSIS — C50412 Malignant neoplasm of upper-outer quadrant of left female breast: Secondary | ICD-10-CM

## 2018-03-28 LAB — SURGICAL PATHOLOGY

## 2018-03-28 NOTE — Patient Instructions (Signed)
Return as scheduled 

## 2018-03-28 NOTE — Progress Notes (Signed)
Patient came in today for a left mastectomy check.  The wound is clean, with no signs of infection noted. Rewrapped patient. She is doing well. Follow up as scheduled.

## 2018-03-30 ENCOUNTER — Telehealth: Payer: Self-pay

## 2018-03-30 NOTE — Telephone Encounter (Signed)
-----   Message from Robert Bellow, MD sent at 03/30/2018  7:43 AM EDT ----- Notify the patient that the lymph node was fine.  Looks like everything is out.  Follow-up as scheduled.  Thank you

## 2018-03-30 NOTE — Telephone Encounter (Signed)
Notified patient as instructed, patient pleased. Discussed follow-up appointments, patient agrees  

## 2018-04-03 ENCOUNTER — Ambulatory Visit (INDEPENDENT_AMBULATORY_CARE_PROVIDER_SITE_OTHER): Payer: Medicare Other | Admitting: General Surgery

## 2018-04-03 ENCOUNTER — Encounter: Payer: Self-pay | Admitting: General Surgery

## 2018-04-03 VITALS — BP 104/58 | HR 81 | Resp 16 | Ht 64.0 in | Wt 143.0 lb

## 2018-04-03 DIAGNOSIS — C50412 Malignant neoplasm of upper-outer quadrant of left female breast: Secondary | ICD-10-CM

## 2018-04-03 DIAGNOSIS — Z170 Estrogen receptor positive status [ER+]: Secondary | ICD-10-CM

## 2018-04-03 NOTE — Patient Instructions (Signed)
The patient is aware to call back for any questions or concerns.  

## 2018-04-03 NOTE — Progress Notes (Signed)
Patient ID: Aimee Gutierrez, female   DOB: 01/06/1933, 84 y.o.   MRN: [id number]  Chief Complaint  Patient presents with  . Routine Post Op    HPI Aimee Gutierrez is a 85 y.o. female.  Here today for her postoperative visit, left mastecomy on 03-26-18. She states she is doing well. She is her with her daughter, Clarene Critchley.  HPI  Past Medical History:  Diagnosis Date  . Arthritis   . Breast cancer ([place]) 03/09/15   right breast, radiation  . Breast cancer ([place]) 2013   left breast, radiation  . Breast cancer of lower-outer quadrant of right female breast ([place]) 03/09/2015   3.5 mm invasive carcinoma, low-grade DCIS. ER 90%, PR greater than 50%, HER-2/neu not amplified. Axillary sentinel node and radiation deferred based on age..  . Cancer ([place]) 2013   with radiation, stage 1, left breast  . Environmental allergies   . GERD (gastroesophageal reflux disease)   . Hypothyroidism   . IBS (irritable bowel syndrome)    in the past, diverticulitis in the past.  . Osteoporosis   . Personal history of radiation therapy 2016  . Personal history of radiation therapy 2013    Past Surgical History:  Procedure Laterality Date  . BREAST BIOPSY Right 02/17/15   positive  . BREAST BIOPSY Left 03/13/2018   [place] core bx, INVASIVE MAMMARY CARCINOMA  . BREAST LUMPECTOMY Right 2016  . BREAST LUMPECTOMY Left 2013  . BREAST SURGERY Left 2013   [place] Right 03/09/15   wide excision  . CATARACT EXTRACTION  2013, 2015  . OVARY SURGERY  2001   benign  . SENTINEL NODE BIOPSY Left 03/26/2018   Procedure: SENTINEL NODE BIOPSY;  Surgeon: Robert Bellow, MD;  Location: [facility] ORS;  Service: General;  Laterality: Left;  . SIMPLE MASTECTOMY WITH AXILLARY SENTINEL NODE BIOPSY Left 03/26/2018   Procedure: SIMPLE MASTECTOMY;  Surgeon: Robert Bellow, MD;  Location: [facility] ORS;  Service: General;  Laterality: Left;  . TOTAL KNEE ARTHROPLASTY Right 10/12/2016   Procedure: TOTAL KNEE ARTHROPLASTY;   Surgeon: Dereck Leep, MD;  Location: [facility] ORS;  Service: Orthopedics;  Laterality: Right;  . VENTRAL HERNIA REPAIR N/A 02/01/2018   Procedure: HERNIA REPAIR VENTRAL ADULT;  Surgeon: Herbert Pun, MD;  Location: [facility] ORS;  Service: General;  Laterality: N/A;    Family History  Problem Relation Age of Onset  . Arthritis Mother   . Heart disease Father   . Alzheimer's disease Sister   . Breast cancer Paternal Grandmother     Social History Social History   Tobacco Use  . Smoking status: Former Smoker    Last attempt to quit: 12/05/2009    Years since quitting: 8.3  . Smokeless tobacco: Never Used  Substance Use Topics  . Alcohol use: No    Alcohol/week: 0.0 oz    Comment: rare glass of wine  . Drug use: No    Allergies  Allergen Reactions  . Nitrofurantoin Nausea Only  . Latex Rash    Latex IgE was NEGATIVE (<0.10)  . Levothyroxine Nausea Only    Shaking, nausea, hypertension-can tolerate Synthroid  . Sulfa Antibiotics Rash    Current Outpatient Medications  Medication Sig Dispense Refill  . acetaminophen (TYLENOL) 500 MG tablet Take 500 mg by mouth daily as needed for moderate pain.    Marland Kitchen aspirin EC 81 MG tablet Take 81 mg by mouth at bedtime as needed (light headed, dizziness).    Marland Kitchen  calcium carbonate (TUMS EX) 750 MG chewable tablet Chew 2 tablets by mouth daily.    . Cholecalciferol (VITAMIN D) 2000 units tablet Take 4,000 Units by mouth daily.    . Lactobacillus (FLORAJEN ACIDOPHILUS) CAPS Take 1 capsule by mouth every other day. At night    . lidocaine-prilocaine (EMLA) cream Apply 1 application topically as needed. Apply to left breast areola one hour prior to coming to the hospital for your surgery. Cover site with saran wrap. 5 g 0  . Menthol, Topical Analgesic, (BLUE-EMU MAXIMUM STRENGTH EX) Apply 1 application topically daily as needed (knee pain).    Marland Kitchen omeprazole (PRILOSEC) 20 MG capsule Take 20 mg by mouth daily as needed (acid reflux).    . SYNTHROID  88 MCG tablet Take 88 mcg by mouth daily.     No current facility-administered medications for this visit.     Review of Systems Review of Systems  Constitutional: Negative.   Respiratory: Negative.   Cardiovascular: Negative.     Blood pressure (!) 104/58, pulse 81, resp. rate 16, height _0  (1.626 m), weight 143 lb (64.9 kg), SpO2 94 %.  Physical Exam Physical Exam  Constitutional: She is oriented to person, place, and time. She appears well-developed and well-nourished.  Pulmonary/Chest:  Drain removed    Musculoskeletal:  Excellent upper extremity range of motion  Neurological: She is alert and oriented to person, place, and time.  Skin: Skin is warm and dry.  Psychiatric: Her behavior is normal.    Data Reviewed DIAGNOSIS:  A. LEFT BREAST; MASTECTOMY:  - INVASIVE MAMMARY CARCINOMA OF NO SPECIAL TYPE, CLINICALLY RECURRENT.  - BIOPSY SITE CHANGES, MARKER CLIP PRESENT.  - THE SURGICAL MARGINS ARE NEGATIVE.   B. SENTINEL LYMPH NODE #1; EXCISION:  - NO TUMOR SEEN IN ONE LYMPH NODE (0/1).   C. SENTINEL LYMPH NODE #2; EXCISION:  - FIBROADIPOSE TISSUE.   CANCER CASE SUMMARY: INVASIVE CARCINOMA OF THE BREAST  Procedure: Total mastectomy  Specimen Laterality: Left  Tumor Size: 8 mm  Histologic Type: Invasive carcinoma of no special type  Histologic Grade ([place] Histologic Score)            Glandular (Acinar)/Tubular Differentiation: 3            Nuclear Pleomorphism: 2            Mitotic Rate: 1            Overall Grade: 2  Ductal Carcinoma In Situ (DCIS): Present  Margins:            Invasive Carcinoma Margins: Uninvolved by invasive  carcinoma            Distance from closest margin: 13 mm            Specify closest margin: Deep            DCIS Margins: Uninvolved by DCIS            Distance from closest margin: 13 mm            Specify  closest margin: Deep   Regional Lymph Nodes: Uninvolved by tumor cells  Number of Lymph Nodes Examined: 1  Number of Sentinel Nodes Examined: 1  Lymphovascular Invasion: Not identified  Pathologic Stage Classification (pTNM, AJCC 8th Edition): rpT1b rpN0(sn)   TNM Descriptors: r (recurrent)   Biomarkers were performed on a separate specimen and in summary:  Estrogen Receptor (ER) Status: Positive, % cells with nuclear  positivity: >90%  Progesterone Receptor (PgR)  Status: Positive, % cells with nuclear  positivity: 1%  HER 2 FISH: Negative   JP drain record sheet reviewed.  Volume less than 30 cc/day.  Assessment    Doing well post left mastectomy.  Excellent shoulder range of motion.    Plan    Bone density was 05-25-17  RX for bra and prothesis Follow up in one week.  Follow up with Dr Mike Gip as scheduled.  Role of antiestrogen therapy discussed.  Patient is concerned about worsening osteoporosis (T score -3+.).  She will review with medical oncology.      HPI, Physical Exam, Assessment and Plan have been scribed under the direction and in the presence of Robert Bellow, MD. Karie Fetch, RN  I have completed the exam and reviewed the above documentation for accuracy and completeness.  I agree with the above.  Haematologist has been used and any errors in dictation or transcription are unintentional.  Hervey Ard, M.D., F.A.C.S.  Forest Gleason Doralyn Kirkes 04/04/2018, 5:10 AM

## 2018-04-04 ENCOUNTER — Other Ambulatory Visit: Payer: Self-pay

## 2018-04-12 ENCOUNTER — Ambulatory Visit (INDEPENDENT_AMBULATORY_CARE_PROVIDER_SITE_OTHER): Payer: Medicare Other | Admitting: General Surgery

## 2018-04-12 ENCOUNTER — Encounter: Payer: Self-pay | Admitting: General Surgery

## 2018-04-12 ENCOUNTER — Inpatient Hospital Stay (HOSPITAL_BASED_OUTPATIENT_CLINIC_OR_DEPARTMENT_OTHER): Payer: Medicare Other | Admitting: Hematology and Oncology

## 2018-04-12 ENCOUNTER — Other Ambulatory Visit: Payer: Self-pay

## 2018-04-12 ENCOUNTER — Encounter: Payer: Self-pay | Admitting: Hematology and Oncology

## 2018-04-12 ENCOUNTER — Inpatient Hospital Stay: Payer: Medicare Other | Attending: Hematology and Oncology

## 2018-04-12 VITALS — BP 120/68 | HR 76 | Temp 97.1°F | Resp 18 | Wt 144.2 lb

## 2018-04-12 VITALS — BP 128/74 | HR 70 | Resp 14 | Ht 64.0 in | Wt 134.0 lb

## 2018-04-12 DIAGNOSIS — K219 Gastro-esophageal reflux disease without esophagitis: Secondary | ICD-10-CM | POA: Diagnosis not present

## 2018-04-12 DIAGNOSIS — D693 Immune thrombocytopenic purpura: Secondary | ICD-10-CM | POA: Diagnosis not present

## 2018-04-12 DIAGNOSIS — E039 Hypothyroidism, unspecified: Secondary | ICD-10-CM | POA: Insufficient documentation

## 2018-04-12 DIAGNOSIS — R634 Abnormal weight loss: Secondary | ICD-10-CM

## 2018-04-12 DIAGNOSIS — C50412 Malignant neoplasm of upper-outer quadrant of left female breast: Secondary | ICD-10-CM

## 2018-04-12 DIAGNOSIS — Z7982 Long term (current) use of aspirin: Secondary | ICD-10-CM | POA: Diagnosis not present

## 2018-04-12 DIAGNOSIS — Z9012 Acquired absence of left breast and nipple: Secondary | ICD-10-CM

## 2018-04-12 DIAGNOSIS — D1803 Hemangioma of intra-abdominal structures: Secondary | ICD-10-CM | POA: Diagnosis not present

## 2018-04-12 DIAGNOSIS — M810 Age-related osteoporosis without current pathological fracture: Secondary | ICD-10-CM

## 2018-04-12 DIAGNOSIS — Z170 Estrogen receptor positive status [ER+]: Secondary | ICD-10-CM | POA: Diagnosis not present

## 2018-04-12 DIAGNOSIS — Z79899 Other long term (current) drug therapy: Secondary | ICD-10-CM | POA: Diagnosis not present

## 2018-04-12 DIAGNOSIS — M1990 Unspecified osteoarthritis, unspecified site: Secondary | ICD-10-CM

## 2018-04-12 DIAGNOSIS — Z923 Personal history of irradiation: Secondary | ICD-10-CM | POA: Diagnosis not present

## 2018-04-12 DIAGNOSIS — Z853 Personal history of malignant neoplasm of breast: Secondary | ICD-10-CM | POA: Diagnosis not present

## 2018-04-12 DIAGNOSIS — Z87891 Personal history of nicotine dependence: Secondary | ICD-10-CM | POA: Diagnosis not present

## 2018-04-12 DIAGNOSIS — C50911 Malignant neoplasm of unspecified site of right female breast: Secondary | ICD-10-CM

## 2018-04-12 DIAGNOSIS — C50912 Malignant neoplasm of unspecified site of left female breast: Secondary | ICD-10-CM

## 2018-04-12 DIAGNOSIS — Z7189 Other specified counseling: Secondary | ICD-10-CM

## 2018-04-12 LAB — COMPREHENSIVE METABOLIC PANEL
ALT: 15 U/L (ref 14–54)
AST: 18 U/L (ref 15–41)
Albumin: 3.9 g/dL (ref 3.5–5.0)
Alkaline Phosphatase: 75 U/L (ref 38–126)
Anion gap: 7 (ref 5–15)
BUN: 18 mg/dL (ref 6–20)
CO2: 28 mmol/L (ref 22–32)
Calcium: 9.3 mg/dL (ref 8.9–10.3)
Chloride: 103 mmol/L (ref 101–111)
Creatinine, Ser: 0.98 mg/dL (ref 0.44–1.00)
GFR calc Af Amer: >60 mL/min (ref >60)
GFR calc non Af Amer: 52 mL/min — ABNORMAL LOW (ref >60)
Glucose, Bld: 117 mg/dL — ABNORMAL HIGH (ref 65–99)
Potassium: 4.3 mmol/L (ref 3.5–5.1)
Sodium: 138 mmol/L (ref 135–145)
Total Bilirubin: 0.5 mg/dL (ref 0.3–1.2)
Total Protein: 6.9 g/dL (ref 6.5–8.1)

## 2018-04-12 LAB — CBC WITH DIFFERENTIAL/PLATELET
Basophils Absolute: 0.1 10*3/uL (ref 0–0.1)
Basophils Relative: 1 %
Eosinophils Absolute: 0.3 10*3/uL (ref 0–0.7)
Eosinophils Relative: 5 %
HCT: 38.5 % (ref 35.0–47.0)
Hemoglobin: 13.1 g/dL (ref 12.0–16.0)
Lymphocytes Relative: 17 %
Lymphs Abs: 0.9 10*3/uL — ABNORMAL LOW (ref 1.0–3.6)
MCH: 30.1 pg (ref 26.0–34.0)
MCHC: 34 g/dL (ref 32.0–36.0)
MCV: 88.5 fL (ref 80.0–100.0)
Monocytes Absolute: 0.4 10*3/uL (ref 0.2–0.9)
Monocytes Relative: 7 %
Neutro Abs: 3.9 10*3/uL (ref 1.4–6.5)
Neutrophils Relative %: 70 %
Platelets: 129 10*3/uL — ABNORMAL LOW (ref 150–440)
RBC: 4.35 MIL/uL (ref 3.80–5.20)
RDW: 15.3 % — ABNORMAL HIGH (ref 11.5–14.5)
WBC: 5.6 10*3/uL (ref 3.6–11.0)

## 2018-04-12 NOTE — Patient Instructions (Signed)
Tamoxifen oral tablet What is this medicine? TAMOXIFEN (ta MOX i fen) blocks the effects of estrogen. It is commonly used to treat breast cancer. It is also used to decrease the chance of breast cancer coming back in women who have received treatment for the disease. It may also help prevent breast cancer in women who have a high risk of developing breast cancer. This medicine may be used for other purposes; ask your health care provider or pharmacist if you have questions. COMMON BRAND NAME(S): Nolvadex What should I tell my health care provider before I take this medicine? They need to know if you have any of these conditions: -blood clots -blood disease -cataracts or impaired eyesight -endometriosis -high calcium levels -high cholesterol -irregular menstrual cycles -liver disease -stroke -uterine fibroids -an unusual reaction to tamoxifen, other medicines, foods, dyes, or preservatives -pregnant or trying to get pregnant -breast-feeding How should I use this medicine? Take this medicine by mouth with a glass of water. Follow the directions on the prescription label. You can take it with or without food. Take your medicine at regular intervals. Do not take your medicine more often than directed. Do not stop taking except on your doctor's advice. A special MedGuide will be given to you by the pharmacist with each prescription and refill. Be sure to read this information carefully each time. Talk to your pediatrician regarding the use of this medicine in children. While this drug may be prescribed for selected conditions, precautions do apply. Overdosage: If you think you have taken too much of this medicine contact a poison control center or emergency room at once. NOTE: This medicine is only for you. Do not share this medicine with others. What if I miss a dose? If you miss a dose, take it as soon as you can. If it is almost time for your next dose, take only that dose. Do not take  double or extra doses. What may interact with this medicine? Do not take this medicine with any of the following medications: -cisapride -certain medicines for irregular heart beat like dofetilide, dronedarone, quinidine -certain medicines for fungal infection like fluconazole, posaconazole -pimozide -saquinavir -thioridazine This medicine may also interact with the following medications: -aminoglutethimide -anastrozole -bromocriptine -chemotherapy drugs -female hormones, like estrogens and birth control pills -letrozole -medroxyprogesterone -phenobarbital -rifampin -warfarin This list may not describe all possible interactions. Give your health care provider a list of all the medicines, herbs, non-prescription drugs, or dietary supplements you use. Also tell them if you smoke, drink alcohol, or use illegal drugs. Some items may interact with your medicine. What should I watch for while using this medicine? Visit your doctor or health care professional for regular checks on your progress. You will need regular pelvic exams, breast exams, and mammograms. If you are taking this medicine to reduce your risk of getting breast cancer, you should know that this medicine does not prevent all types of breast cancer. If breast cancer or other problems occur, there is no guarantee that it will be found at an early stage. Do not become pregnant while taking this medicine or for 2 months after stopping this medicine. Stop taking this medicine if you get pregnant or think you are pregnant and contact your doctor. This medicine may harm your unborn baby. Women who can possibly become pregnant should use birth control methods that do not use hormones during tamoxifen treatment and for 2 months after therapy has stopped. Talk with your health care provider for birth control advice.  Do not breast feed while taking this medicine. What side effects may I notice from receiving this medicine? Side effects that  you should report to your doctor or health care professional as soon as possible: -allergic reactions like skin rash, itching or hives, swelling of the face, lips, or tongue -changes in vision -changes in your menstrual cycle -difficulty walking or talking -new breast lumps -numbness -pelvic pain or pressure -redness, blistering, peeling or loosening of the skin, including inside the mouth -signs and symptoms of a dangerous change in heartbeat or heart rhythm like chest pain, dizziness, fast or irregular heartbeat, palpitations, feeling faint or lightheaded, falls, breathing problems -sudden chest pain -swelling, pain or tenderness in your calf or leg -unusual bruising or bleeding -vaginal discharge that is bloody, brown, or rust -weakness -yellowing of the whites of the eyes or skin Side effects that usually do not require medical attention (report to your doctor or health care professional if they continue or are bothersome): -fatigue -hair loss, although uncommon and is usually mild -headache -hot flashes -impotence (in men) -nausea, vomiting (mild) -vaginal discharge (white or clear) This list may not describe all possible side effects. Call your doctor for medical advice about side effects. You may report side effects to FDA at 1-800-FDA-1088. Where should I keep my medicine? Keep out of the reach of children. Store at room temperature between 20 and 25 degrees C (68 and 77 degrees F). Protect from light. Keep container tightly closed. Throw away any unused medicine after the expiration date. NOTE: This sheet is a summary. It may not cover all possible information. If you have questions about this medicine, talk to your doctor, pharmacist, or health care provider.  2018 [employer] Standard (2016-06-10 07:27:41)  Letrozole tablets What is this medicine? LETROZOLE (LET roe zole) blocks the production of estrogen. It is used to treat breast cancer. This medicine may be used  for other purposes; ask your health care provider or pharmacist if you have questions. COMMON BRAND NAME(S): Femara What should I tell my health care provider before I take this medicine? They need to know if you have any of these conditions: -high cholesterol -liver disease -osteoporosis (weak bones) -an unusual or allergic reaction to letrozole, other medicines, foods, dyes, or preservatives -pregnant or trying to get pregnant -breast-feeding How should I use this medicine? Take this medicine by mouth with a glass of water. You may take it with or without food. Follow the directions on the prescription label. Take your medicine at regular intervals. Do not take your medicine more often than directed. Do not stop taking except on your doctor's advice. Talk to your pediatrician regarding the use of this medicine in children. Special care may be needed. Overdosage: If you think you have taken too much of this medicine contact a poison control center or emergency room at once. NOTE: This medicine is only for you. Do not share this medicine with others. What if I miss a dose? If you miss a dose, take it as soon as you can. If it is almost time for your next dose, take only that dose. Do not take double or extra doses. What may interact with this medicine? Do not take this medicine with any of the following medications: -estrogens, like hormone replacement therapy or birth control pills This medicine may also interact with the following medications: -dietary supplements such as androstenedione or DHEA -prasterone -tamoxifen This list may not describe all possible interactions. Give your health care provider a list  of all the medicines, herbs, non-prescription drugs, or dietary supplements you use. Also tell them if you smoke, drink alcohol, or use illegal drugs. Some items may interact with your medicine. What should I watch for while using this medicine? Tell your doctor or healthcare  professional if your symptoms do not start to get better or if they get worse. Do not become pregnant while taking this medicine or for 3 weeks after stopping it. Women should inform their doctor if they wish to become pregnant or think they might be pregnant. There is a potential for serious side effects to an unborn child. Talk to your health care professional or pharmacist for more information. Do not breast-feed while taking this medicine or for 3 weeks after stopping it. This medicine may interfere with the ability to have a child. Talk with your doctor or health care professional if you are concerned about your fertility. Using this medicine for a long time may increase your risk of low bone mass. Talk to your doctor about bone health. You may get drowsy or dizzy. Do not drive, use machinery, or do anything that needs mental alertness until you know how this medicine affects you. Do not stand or sit up quickly, especially if you are an older patient. This reduces the risk of dizzy or fainting spells. You may need blood work done while you are taking this medicine. What side effects may I notice from receiving this medicine? Side effects that you should report to your doctor or health care professional as soon as possible: -allergic reactions like skin rash, itching, or hives -bone fracture -chest pain -signs and symptoms of a blood clot such as breathing problems; changes in vision; chest pain; severe, sudden headache; pain, swelling, warmth in the leg; trouble speaking; sudden numbness or weakness of the face, arm or leg -vaginal bleeding Side effects that usually do not require medical attention (report to your doctor or health care professional if they continue or are bothersome): -bone, back, joint, or muscle pain -dizziness -fatigue -fluid retention -headache -hot flashes, night sweats -nausea -weight gain This list may not describe all possible side effects. Call your doctor for  medical advice about side effects. You may report side effects to FDA at 1-800-FDA-1088. Where should I keep my medicine? Keep out of the reach of children. Store between 15 and 30 degrees C (59 and 86 degrees F). Throw away any unused medicine after the expiration date. NOTE: This sheet is a summary. It may not cover all possible information. If you have questions about this medicine, talk to your doctor, pharmacist, or health care provider.  2018 [employer] Standard (2016-06-27 11:10:41)  Denosumab injection What is this medicine? DENOSUMAB (den oh sue mab) slows bone breakdown. Prolia is used to treat osteoporosis in women after menopause and in men. Delton See is used to treat a high calcium level due to cancer and to prevent bone fractures and other bone problems caused by multiple myeloma or cancer bone metastases. Delton See is also used to treat giant cell tumor of the bone. This medicine may be used for other purposes; ask your health care provider or pharmacist if you have questions. COMMON BRAND NAME(S): Prolia, XGEVA What should I tell my health care provider before I take this medicine? They need to know if you have any of these conditions: -dental disease -having surgery or tooth extraction -infection -kidney disease -low levels of calcium or Vitamin D in the blood -malnutrition -on hemodialysis -skin conditions or sensitivity -thyroid  or parathyroid disease -an unusual reaction to denosumab, other medicines, foods, dyes, or preservatives -pregnant or trying to get pregnant -breast-feeding How should I use this medicine? This medicine is for injection under the skin. It is given by a health care professional in a hospital or clinic setting. If you are getting Prolia, a special MedGuide will be given to you by the pharmacist with each prescription and refill. Be sure to read this information carefully each time. For Prolia, talk to your pediatrician regarding the use of this  medicine in children. Special care may be needed. For Delton See, talk to your pediatrician regarding the use of this medicine in children. While this drug may be prescribed for children as young as 13 years for selected conditions, precautions do apply. Overdosage: If you think you have taken too much of this medicine contact a poison control center or emergency room at once. NOTE: This medicine is only for you. Do not share this medicine with others. What if I miss a dose? It is important not to miss your dose. Call your doctor or health care professional if you are unable to keep an appointment. What may interact with this medicine? Do not take this medicine with any of the following medications: -other medicines containing denosumab This medicine may also interact with the following medications: -medicines that lower your chance of fighting infection -steroid medicines like prednisone or cortisone This list may not describe all possible interactions. Give your health care provider a list of all the medicines, herbs, non-prescription drugs, or dietary supplements you use. Also tell them if you smoke, drink alcohol, or use illegal drugs. Some items may interact with your medicine. What should I watch for while using this medicine? Visit your doctor or health care professional for regular checks on your progress. Your doctor or health care professional may order blood tests and other tests to see how you are doing. Call your doctor or health care professional for advice if you get a fever, chills or sore throat, or other symptoms of a cold or flu. Do not treat yourself. This drug may decrease your body's ability to fight infection. Try to avoid being around people who are sick. You should make sure you get enough calcium and vitamin D while you are taking this medicine, unless your doctor tells you not to. Discuss the foods you eat and the vitamins you take with your health care professional. See your  dentist regularly. Brush and floss your teeth as directed. Before you have any dental work done, tell your dentist you are receiving this medicine. Do not become pregnant while taking this medicine or for 5 months after stopping it. Talk with your doctor or health care professional about your birth control options while taking this medicine. Women should inform their doctor if they wish to become pregnant or think they might be pregnant. There is a potential for serious side effects to an unborn child. Talk to your health care professional or pharmacist for more information. What side effects may I notice from receiving this medicine? Side effects that you should report to your doctor or health care professional as soon as possible: -allergic reactions like skin rash, itching or hives, swelling of the face, lips, or tongue -bone pain -breathing problems -dizziness -jaw pain, especially after dental work -redness, blistering, peeling of the skin -signs and symptoms of infection like fever or chills; cough; sore throat; pain or trouble passing urine -signs of low calcium like fast heartbeat, muscle cramps  or muscle pain; pain, tingling, numbness in the hands or feet; seizures -unusual bleeding or bruising -unusually weak or tired Side effects that usually do not require medical attention (report to your doctor or health care professional if they continue or are bothersome): -constipation -diarrhea -headache -joint pain -loss of appetite -muscle pain -runny nose -tiredness -upset stomach This list may not describe all possible side effects. Call your doctor for medical advice about side effects. You may report side effects to FDA at 1-800-FDA-1088. Where should I keep my medicine? This medicine is only given in a clinic, doctor's office, or other health care setting and will not be stored at home. NOTE: This sheet is a summary. It may not cover all possible information. If you have questions  about this medicine, talk to your doctor, pharmacist, or health care provider.  2018 [employer] Standard (2016-12-13 19:17:21)

## 2018-04-12 NOTE — Progress Notes (Signed)
[facility] day:  04/12/2018   Chief Complaint: Aimee Gutierrez is a 84 y.o. female with a history of bilateral breast cancer who is seen after interval mastectomy for assessment and discussions regarding direction of therapy.  HPI: The patient was last seen in the medical oncology clinic on 03/15/2018.  At that time, left breast biopsy on 03/13/2018 revealed a grade II invasive mammary carcinoma. We discussed referral to surgery.  We discussed treatment with hormonal therapy based on ER and PR status.  She was seen by Dr. Bary Castilla on 03/15/2018.  Lumpectomy versus mastectomy was discussed.    She underwent left simple mastectomy and sentinel lymph node biopsy on 03/26/2018.  Pathology revealed an 8 mm grade II invasive carcinoma of no special type.  DCIS was present.  There was no lymphovascular invasion.  Margins were negative.  Tumor was ER + (> 90%), PR + (1%), and Her2/neu -.  Pathologic stage was rpT1b rpN0 (sn).  Patient seen in follow up consult by Dr. Bary Castilla this morning. Drains were removed last week. Patient with small seroma that required drainage today.   Symptomatically, patient is doing well today. There are no acute concerns. Patient denies B symptoms and interval infections. Patient is eating well. Weight has decreased by 2 pounds. Patient denies pain in the clinic today.   Patient is scheduled for knee surgery in 07/04/2018.   Past Medical History:  Diagnosis Date  . Arthritis   . Breast cancer ([place]) 03/09/15   right breast, radiation  . Breast cancer ([place]) 2013   left breast, radiation  . Breast cancer of lower-outer quadrant of right female breast ([place]) 03/09/2015   3.5 mm invasive carcinoma, low-grade DCIS. ER 90%, PR greater than 50%, HER-2/neu not amplified. Axillary sentinel node and radiation deferred based on age..  . Cancer ([place]) 2013   with radiation, stage 1, left breast  . Environmental allergies   . GERD  (gastroesophageal reflux disease)   . Hypothyroidism   . IBS (irritable bowel syndrome)    in the past, diverticulitis in the past.  . Osteoporosis   . Personal history of radiation therapy 2016  . Personal history of radiation therapy 2013    Past Surgical History:  Procedure Laterality Date  . BREAST BIOPSY Right 02/17/15   positive  . BREAST BIOPSY Left 03/13/2018   [place] core bx, INVASIVE MAMMARY CARCINOMA  . BREAST LUMPECTOMY Right 2016  . BREAST LUMPECTOMY Left 2013  . BREAST SURGERY Left 2013   [place] Right 03/09/15   wide excision  . CATARACT EXTRACTION  2013, 2015  . OVARY SURGERY  2001   benign  . SENTINEL NODE BIOPSY Left 03/26/2018   Procedure: SENTINEL NODE BIOPSY;  Surgeon: Robert Bellow, MD;  Location: [facility] ORS;  Service: General;  Laterality: Left;  . SIMPLE MASTECTOMY WITH AXILLARY SENTINEL NODE BIOPSY Left 03/26/2018   Procedure: SIMPLE MASTECTOMY;  Surgeon: Robert Bellow, MD;  Location: [facility] ORS;  Service: General;  Laterality: Left;  . TOTAL KNEE ARTHROPLASTY Right 10/12/2016   Procedure: TOTAL KNEE ARTHROPLASTY;  Surgeon: Dereck Leep, MD;  Location: [facility] ORS;  Service: Orthopedics;  Laterality: Right;  . VENTRAL HERNIA REPAIR N/A 02/01/2018   Procedure: HERNIA REPAIR VENTRAL ADULT;  Surgeon: Herbert Pun, MD;  Location: [facility] ORS;  Service: General;  Laterality: N/A;    Family History  Problem Relation Age of Onset  . Arthritis Mother   .  Heart disease Father   . Alzheimer's disease Sister   . Breast cancer Paternal Grandmother     Social History:  reports that she quit smoking about 8 years ago. She has never used smokeless tobacco. She reports that she does not drink alcohol or use drugs.  He son died on Apr 17, 2016 and her husband died on 05/01/2016.  The patient is accompanied daughter, [name], today.    Allergies:  Allergies  Allergen Reactions  . Nitrofurantoin Nausea Only  . Latex Rash    Latex IgE was  NEGATIVE (<0.10)  . Levothyroxine Nausea Only    Shaking, nausea, hypertension-can tolerate Synthroid  . Sulfa Antibiotics Rash    Current Medications: Current Outpatient Medications  Medication Sig Dispense Refill  . acetaminophen (TYLENOL) 500 MG tablet Take 500 mg by mouth daily as needed for moderate pain.    . calcium carbonate (TUMS EX) 750 MG chewable tablet Chew 2 tablets by mouth daily.    . Cholecalciferol (VITAMIN D) 2000 units tablet Take 4,000 Units by mouth daily.    . Lactobacillus (FLORAJEN ACIDOPHILUS) CAPS Take 1 capsule by mouth every other day. At night    . Menthol, Topical Analgesic, (BLUE-EMU MAXIMUM STRENGTH EX) Apply 1 application topically daily as needed (knee pain).    . SYNTHROID 88 MCG tablet Take 88 mcg by mouth daily.    Marland Kitchen aspirin EC 81 MG tablet Take 81 mg by mouth at bedtime as needed (light headed, dizziness).    Marland Kitchen lidocaine-prilocaine (EMLA) cream Apply 1 application topically as needed. Apply to left breast areola one hour prior to coming to the hospital for your surgery. Cover site with saran wrap. (Patient not taking: Reported on 04/12/2018) 5 g 0  . omeprazole (PRILOSEC) 20 MG capsule Take 20 mg by mouth daily as needed (acid reflux).     No current facility-administered medications for this visit.     Review of Systems  Constitutional: Positive for weight loss (down 2 pounds). Negative for diaphoresis, fever and malaise/fatigue.  HENT: Negative.   Eyes: Negative.   Respiratory: Negative for cough, hemoptysis, sputum production and shortness of breath.   Cardiovascular: Negative for chest pain, palpitations, orthopnea, leg swelling and PND.  Gastrointestinal: Negative for abdominal pain, blood in stool, constipation, diarrhea, melena, nausea and vomiting.  Genitourinary: Negative for dysuria, frequency, hematuria and urgency.       (+) frequent "stomach aches" with certain foods. She is s/p ventral hernia repair.  Musculoskeletal: Positive for  joint pain (arthritis in hands. LEFT knee issues; scheduled for surgery on 07/04/2018.). Negative for back pain, falls and myalgias.  Skin: Negative for itching and rash.  Neurological: Negative for dizziness, tremors, weakness and headaches.  Endo/Heme/Allergies: Does not bruise/bleed easily.       HYPOthryroidism on levothyroxine  Psychiatric/Behavioral: Negative for depression, memory loss and suicidal ideas. The patient is not nervous/anxious and does not have insomnia.   All other systems reviewed and are negative.  Physical Exam: Blood pressure 120/68, pulse 76, temperature (!) 97.1 F (36.2 C), temperature source Tympanic, resp. rate 18, weight 144 lb 3.2 oz (65.4 kg). GENERAL:  Well developed, well nourished, woman sitting comfortably in the exam room in no acute distress.  She has a cane at her side. MENTAL STATUS:  Alert and oriented to person, place and time. HEAD:  Short gray hair.  Normocephalic, atraumatic, face symmetric, no Cushingoid features. EYES:  Hazel eyes.  Pupils equal round and reactive to light and accomodation.  No conjunctivitis or  scleral icterus. ENT:  Oropharynx clear without lesion.  Tongue normal. Mucous membranes moist.  RESPIRATORY:  Clear to auscultation without rales, wheezes or rhonchi. CARDIOVASCULAR:  Regular rate and rhythm without murmur, rub or gallop. BREAST:  LEFT mastectomy site clean with no evidence of infection.  Steri-strips in place.  Tender upper left chest wall.  RIGHT breast with fibrocystic changes. No masses, skin changes or nipple discharge.   ABDOMEN:  Soft, non-tender, with active bowel sounds, and no hepatosplenomegaly.  No masses. SKIN:  No rashes, ulcers or lesions. EXTREMITIES: Arthritic changes in hands.  No edema, no skin discoloration or tenderness.  No palpable cords. LYMPH NODES: No palpable cervical, supraclavicular, axillary or inguinal adenopathy  NEUROLOGICAL: Unremarkable. PSYCH:  Appropriate.   Appointment on  04/12/2018  Component Date Value Ref Range Status  . WBC 04/12/2018 5.6  3.6 - 11.0 K/uL Final  . RBC 04/12/2018 4.35  3.80 - 5.20 MIL/uL Final  . Hemoglobin 04/12/2018 13.1  12.0 - 16.0 g/dL Final  . HCT 04/12/2018 38.5  35.0 - 47.0 % Final  . MCV 04/12/2018 88.5  80.0 - 100.0 fL Final  . MCH 04/12/2018 30.1  26.0 - 34.0 pg Final  . MCHC 04/12/2018 34.0  32.0 - 36.0 g/dL Final  . RDW 04/12/2018 15.3* 11.5 - 14.5 % Final  . Platelets 04/12/2018 129* 150 - 440 K/uL Final  . Neutrophils Relative % 04/12/2018 70  % Final  . Neutro Abs 04/12/2018 3.9  1.4 - 6.5 K/uL Final  . Lymphocytes Relative 04/12/2018 17  % Final  . Lymphs Abs 04/12/2018 0.9* 1.0 - 3.6 K/uL Final  . Monocytes Relative 04/12/2018 7  % Final  . Monocytes Absolute 04/12/2018 0.4  0.2 - 0.9 K/uL Final  . Eosinophils Relative 04/12/2018 5  % Final  . Eosinophils Absolute 04/12/2018 0.3  0 - 0.7 K/uL Final  . Basophils Relative 04/12/2018 1  % Final  . Basophils Absolute 04/12/2018 0.1  0 - 0.1 K/uL Final   Performed at [facility], [address], [place], [place] [address]  . Sodium 04/12/2018 138  135 - 145 mmol/L Final  . Potassium 04/12/2018 4.3  3.5 - 5.1 mmol/L Final  . Chloride 04/12/2018 103  101 - 111 mmol/L Final  . CO2 04/12/2018 28  22 - 32 mmol/L Final  . Glucose, Bld 04/12/2018 117* 65 - 99 mg/dL Final  . BUN 04/12/2018 18  6 - 20 mg/dL Final  . Creatinine, Ser 04/12/2018 0.98  0.44 - 1.00 mg/dL Final  . Calcium 04/12/2018 9.3  8.9 - 10.3 mg/dL Final  . Total Protein 04/12/2018 6.9  6.5 - 8.1 g/dL Final  . Albumin 04/12/2018 3.9  3.5 - 5.0 g/dL Final  . AST 04/12/2018 18  15 - 41 U/L Final  . ALT 04/12/2018 15  14 - 54 U/L Final  . Alkaline Phosphatase 04/12/2018 75  38 - 126 U/L Final  . Total Bilirubin 04/12/2018 0.5  0.3 - 1.2 mg/dL Final  . GFR calc non Af Amer 04/12/2018 52* >60 mL/min Final  . GFR calc Af Amer 04/12/2018 >60  >60 mL/min Final   Comment: (NOTE) The eGFR has been  calculated using the CKD EPI equation. This calculation has not been validated in all clinical situations. eGFR's persistently <60 mL/min signify possible Chronic Kidney Disease.   Georgiann Hahn gap 04/12/2018 7  5 - 15 Final   Performed at [facility], [address] [place], [place] [address]  Assessment:  DENAJA VERHOEVEN is a 84 y.o. female with a history of bilateral breast cancer and chronic mild thrombocytopenia..   She initially presented with stage I left breast cancer s/p lumpectomy and sentinel lymph node biopsy on 03/09/2012. Initial biopsy revealed a 7 mm lesion in the left breast. Pathology revealed a 1 mm focus of residual invasive mammry carcinoma with DCIS. Stage T1aN0M0. Tumor was ER/PR+. Her2/neu was not assessed.  She received radiation from 04/26/2012 to 06/20/2012. She declined hormonal therapy.   Mammogram on 02/13/2014 revealed no evidence of maligancy. Mammogram and ultrasound on 02/16/2015 revealed a 5 x 3 x 2 mm hypoechoic mass at the 4 o'clock position of the right breast. Core biopsy on 02/17/2015 revealed low grade DCIS, cribiform type. Estrogen receptor was positive (> 90%) and progesterone receptor positive (> 90%).   She underwent wide excision of the right breast on 03/09/2015. Pathology revealed a 3.5 mm focus of grade II invasive carcinoma with ductal carcinoma in situ. Margins were negative. No lymph nodes were removed. Tumor was ER positive (> 90%), PR positive (51-90%) and Her2/neu negative. Pathologic stage was T1aNxMx. She completed radiation on 05/13/2015.  She decided against hormonal therapy.  Bilateral mammogram on 02/21/2017 revealed stable post lumpectomy changes bilaterally.  Diagnostic mammogram on 03/02/2018 revealing scattered areas of fiber glandular density.  Targeted ultrasound was performed that demonstrated a 6 x 8 x 8 mm irregular hypoechoic LEFT breast mass at the 3 o'clock position, located 3 cm from the nipple.     LEFT breast biopsy on 03/13/2018.  Pathology revealed a grade II invasive mammary carcinoma.  She underwent left simple mastectomy and sentinel lymph node biopsy on 03/26/2018.  Pathology revealed an 8 mm grade II invasive carcinoma of no special type.  DCIS was present.  There was no lymphovascular invasion.  Margins were negative.  Tumor was ER + (> 90%), PR + (1%), and Her2/neu -.  Pathologic stage was rpT1b rpN0 (sn).  CA27.29 has been followed: 24.0 on 04/16/2015, 19.2 on 08/03/2015, 11.8 on 11/10/2015, 14.3 on 04/29/2016, 12.2 on 10/06/2016, 17.7 on 02/23/2017, 13.1 on 05/26/2017, 17 on 10/13/2017, and 8.1 on 04/12/2018.  Bone density study on 05/25/2015 revealed osteoporosis (T score of -3.3 in the femur neck and -3.5 in the forearm). Bone density on 05/25/2017 revealed osteoporosis with a T-score of -3.4 in the left femoral neck and -3.4 in the left forearm radius.  She is taking calcium and vitamin D.  She is not interested in Prolia or Fosamax; readdressed today and patient remains adamant that she does not wish to taking anything.   She has persistent thrombocytopenia c/w mild chronic ITP.  Platelet count has ranged between 96,000 - 161,000 without trend.   Abdominal ultrasound on 01/18/2018 showed a 1.1 cm hyperechoic right liver lobe lesion, felt to be most consistent with a hemangioma.    CT abdomen and pelvis on 01/30/2018 that revealed a 1.2 cm defect within the anterior abdominal wall through which omentum/fat and vessels transverses and appeared inflamed. 1 cm lesion within the posterior right liver lobe again mentioned, and felt to quite possibly a hemangioma.  There was a 1.4 cm lesion at dome of the left lobe of the liver. There was sigmoid diverticulosis with prominent muscular hypertrophy.  Trace free fluid in the right aspect of the pelvis of indeterminate etiology noted.  Small bowel loops that entered the right inguinal canal that did not cause proximal obstruction.  Liver  MRI on 02/09/2018 confirmed that the  previously demonstrated hepatic lesions were hemangiomas.  There was no evidence of metastatic breast cancer.  There was an enlarging fat-containing ventral hernia that appeared inflamed.  There was questionable incarceration. She underwent ventral hernia repair on 02/01/2018.  Symptomatically, patient is health well post operatively following her LEFT mastectomy. She was seen by surgery this morning. She denies B symptoms or recent infections. She has no acute complaints. Exam reveals fibrocystic changes in the RIGHT breast and the fresh post-surgical changes in the LEFT breast.  Labs unremarkable.   Plan: 1.  Labs today:  CBC with diff, CMP, CA27.29. 2.  Discuss interval mastectomy and pathology results.  Tumor was small (8 mm) with no + lymph nodes.  Tumor was ER+.  3.  Discuss hormonal therapy using tamoxifen vs. an aromatase inhibitor. Discussed associated risk of hot flashes and bone thinning associated with AI therapy.  Discussed osteoporosis management with Prolia injections. Patient is unsure and wishes to think about everything. Written information provided today for patient review.  4.  Discuss initiation of tamoxifen. Side effects reviewed. Potential side effects (hot flashes, weight gain, cataract formation, thrombosis, uterine cancer) of tamoxifen reviewed in detail.  Discussed requirements associated with this medication, which include annual eye and pelvic examinations. Wishes to hold at this point.  5.  Discuss the use of Prolia to prevent bone thinning/loss associated with hormonal therapy. Side effects of this medication reviewed. Patient provided with printed information on Prolia on today's AVS. Patient encouraged to take calcium 1200 mg and vitamin D 800 IU daily. Discussed the need for dental clearance prior to beginning Prolia, as this medication increases the risk of dental complications such as osteonecrosis of the jaw. Patient wishes to hold at  this time due to concerns related to side effect profile.  6.  Discuss support system. Patient wishes to be connected with another patient, of a similar age, who has the same diagnosis. Patient feels as if this will make feel more comfortable. She states, "Being able to talk to someone who has been through all of this might help me a lot". Will connect patient with Magdalene Patricia, RN to arrange an appointment with a "[facility] volunteer".  7.  RTC in 1 month for MD assessment, labs (CBC with diff, CMP).   Honor Loh, NP 04/12/18, 2:32 PM  I saw and evaluated the patient, participating in the key portions of the service and reviewing pertinent diagnostic studies and records.  I reviewed the nurse practitioner's note and agree with the findings and the plan.  The assessment and plan were discussed with the patient.  Multiple questions were asked by the patient and answered.   Nolon Stalls, MD 04/12/18,2:32 PM

## 2018-04-12 NOTE — Progress Notes (Signed)
Patient ID: Aimee Gutierrez, female   DOB: [birth year], 84 y.o.   MRN: [id number]  Chief Complaint  Patient presents with  . Routine Post Op    HPI Aimee Gutierrez is a 84 y.o. female here today for her post op left mastectomy done on 03/26/2018. Patient states she is doing well. Patient states she feel tried in the afternoon. .She is her with her daughter, Aimee Gutierrez  HPI  Past Medical History:  Diagnosis Date  . Arthritis   . Breast cancer ([place]) 2013   left breast, radiation  . Breast cancer of lower-outer quadrant of right female breast ([place]) 03/09/2015   3.5 mm invasive carcinoma, low-grade DCIS. ER 90%, PR greater than 50%, HER-2/neu not amplified. Axillary sentinel node and radiation deferred based on age..  . Breast cancer, left ([place]) 03/26/2018   8 mm recurrent invasive mammary carcinoma, sentinel node negative. rpT1b rpN0(sn)   . Environmental allergies   . GERD (gastroesophageal reflux disease)   . Hypothyroidism   . IBS (irritable bowel syndrome)    in the past, diverticulitis in the past.  . Osteoporosis   . Personal history of radiation therapy 2016  . Personal history of radiation therapy 2013    Past Surgical History:  Procedure Laterality Date  . BREAST BIOPSY Right 02/17/15   positive  . BREAST BIOPSY Left 03/13/2018   [place] core bx, INVASIVE MAMMARY CARCINOMA  . BREAST LUMPECTOMY Right 2016  . BREAST LUMPECTOMY Left 2013  . BREAST SURGERY Left 2013   [place] Right 03/09/15   wide excision  . CATARACT EXTRACTION  2013, 2015  . OVARY SURGERY  2001   benign  . SENTINEL NODE BIOPSY Left 03/26/2018   Procedure: SENTINEL NODE BIOPSY;  Surgeon: Robert Bellow, MD;  Location: [facility] ORS;  Service: General;  Laterality: Left;  . SIMPLE MASTECTOMY WITH AXILLARY SENTINEL NODE BIOPSY Left 03/26/2018   Procedure: SIMPLE MASTECTOMY;  Surgeon: Robert Bellow, MD;  Location: [facility] ORS;  Service: General;  Laterality: Left;  . TOTAL KNEE ARTHROPLASTY Right  10/12/2016   Procedure: TOTAL KNEE ARTHROPLASTY;  Surgeon: Dereck Leep, MD;  Location: [facility] ORS;  Service: Orthopedics;  Laterality: Right;  . VENTRAL HERNIA REPAIR N/A 02/01/2018   Procedure: HERNIA REPAIR VENTRAL ADULT;  Surgeon: Herbert Pun, MD;  Location: [facility] ORS;  Service: General;  Laterality: N/A;    Family History  Problem Relation Age of Onset  . Arthritis Mother   . Heart disease Father   . Alzheimer's disease Sister   . Breast cancer Paternal Grandmother     Social History Social History   Tobacco Use  . Smoking status: Former Smoker    Last attempt to quit: 12/05/2009    Years since quitting: 8.3  . Smokeless tobacco: Never Used  Substance Use Topics  . Alcohol use: No    Alcohol/week: 0.0 oz    Comment: rare glass of wine  . Drug use: No    Allergies  Allergen Reactions  . Nitrofurantoin Nausea Only  . Latex Rash    Latex IgE was NEGATIVE (<0.10)  . Levothyroxine Nausea Only    Shaking, nausea, hypertension-can tolerate Synthroid  . Sulfa Antibiotics Rash    Current Outpatient Medications  Medication Sig Dispense Refill  . acetaminophen (TYLENOL) 500 MG tablet Take 500 mg by mouth daily as needed for moderate pain.    Marland Kitchen aspirin EC 81 MG tablet Take 81 mg by mouth at bedtime as needed (light  headed, dizziness).    . calcium carbonate (TUMS EX) 750 MG chewable tablet Chew 2 tablets by mouth daily.    . Cholecalciferol (VITAMIN D) 2000 units tablet Take 4,000 Units by mouth daily.    . Lactobacillus (FLORAJEN ACIDOPHILUS) CAPS Take 1 capsule by mouth every other day. At night    . lidocaine-prilocaine (EMLA) cream Apply 1 application topically as needed. Apply to left breast areola one hour prior to coming to the hospital for your surgery. Cover site with saran wrap. (Patient not taking: Reported on 04/12/2018) 5 g 0  . Menthol, Topical Analgesic, (BLUE-EMU MAXIMUM STRENGTH EX) Apply 1 application topically daily as needed (knee pain).    Marland Kitchen omeprazole  (PRILOSEC) 20 MG capsule Take 20 mg by mouth daily as needed (acid reflux).    . SYNTHROID 88 MCG tablet Take 88 mcg by mouth daily.     No current facility-administered medications for this visit.     Review of Systems Review of Systems  Constitutional: Negative.   Respiratory: Negative.   Cardiovascular: Negative.   Drained 40 ml of fluid.   Blood pressure 128/74, pulse 70, resp. rate 14, height '5\' 4"'$  (1.626 m), weight 134 lb (60.8 kg).  Physical Exam Physical Exam  Constitutional: She is oriented to person, place, and time. She appears well-developed and well-nourished.  Cardiovascular: Normal rate, regular rhythm and normal heart sounds.  Pulmonary/Chest: Effort normal and breath sounds normal.  Left mastectomy site is clean and healing well.     Neurological: She is alert and oriented to person, place, and time.  Skin: Skin is warm and dry.       Assessment    Doing well status post salvage mastectomy.    Candidate for antiestrogen therapy.  Plan Patient to return in one week. The patient is aware to call back for any questions or concerns. Patient will be meeting with medical oncology in the near future to initiate antiestrogen therapy.  HPI, Physical Exam, Assessment and Plan have been scribed under the direction and in the presence of Hervey Ard, MD.  Gaspar Cola, CMA  I have completed the exam and reviewed the above documentation for accuracy and completeness.  I agree with the above.  Haematologist has been used and any errors in dictation or transcription are unintentional.  Hervey Ard, M.D., F.A.C.S.  Forest Gleason Harlen Danford 04/13/2018, 7:06 AM

## 2018-04-13 ENCOUNTER — Encounter: Payer: Self-pay | Admitting: General Surgery

## 2018-04-13 LAB — CANCER ANTIGEN 27.29: CA 27.29: 8.1 U/mL (ref 0.0–38.6)

## 2018-04-18 ENCOUNTER — Encounter: Payer: Self-pay | Admitting: General Surgery

## 2018-04-19 ENCOUNTER — Encounter: Payer: Self-pay | Admitting: General Surgery

## 2018-04-19 ENCOUNTER — Ambulatory Visit: Payer: Medicare Other | Admitting: General Surgery

## 2018-04-19 ENCOUNTER — Ambulatory Visit (INDEPENDENT_AMBULATORY_CARE_PROVIDER_SITE_OTHER): Payer: Medicare Other | Admitting: General Surgery

## 2018-04-19 VITALS — BP 116/74 | HR 85 | Resp 18 | Ht 64.0 in | Wt 144.0 lb

## 2018-04-19 DIAGNOSIS — Z170 Estrogen receptor positive status [ER+]: Secondary | ICD-10-CM

## 2018-04-19 DIAGNOSIS — C50412 Malignant neoplasm of upper-outer quadrant of left female breast: Secondary | ICD-10-CM

## 2018-04-19 NOTE — Patient Instructions (Signed)
The patient is aware to call back for any questions or concerns.  

## 2018-04-19 NOTE — Progress Notes (Signed)
Patient ID: Aimee Gutierrez, female   DOB: 09/20/1933, 84 y.o.   MRN: [id number]  Chief Complaint  Patient presents with  . Routine Post Op    HPI Aimee Gutierrez is a 84 y.o. female.  here today for her post op left mastectomy done on 03/26/2018. Patient states she is doing well. She thinks the fluid has come back some.   HPI  Past Medical History:  Diagnosis Date  . Arthritis   . Breast cancer ([place]) 2013   left breast, radiation  . Breast cancer of lower-outer quadrant of right female breast ([place]) 03/09/2015   3.5 mm invasive carcinoma, low-grade DCIS. ER 90%, PR greater than 50%, HER-2/neu not amplified. Axillary sentinel node and radiation deferred based on age..  . Breast cancer, left ([place]) 03/26/2018   8 mm recurrent invasive mammary carcinoma, sentinel node negative.  ER/PR positive, HER-2/neu not overexpressed.  RpT1b rpN0(sn)   . Environmental allergies   . GERD (gastroesophageal reflux disease)   . Hypothyroidism   . IBS (irritable bowel syndrome)    in the past, diverticulitis in the past.  . Osteoporosis   . Personal history of radiation therapy 2016  . Personal history of radiation therapy 2013    Past Surgical History:  Procedure Laterality Date  . BREAST BIOPSY Right 02/17/15   positive  . BREAST BIOPSY Left 03/13/2018   [place] core bx, INVASIVE MAMMARY CARCINOMA  . BREAST LUMPECTOMY Right 2016  . BREAST LUMPECTOMY Left 2013  . BREAST SURGERY Left 2013   [place] Right 03/09/15   wide excision  . CATARACT EXTRACTION  2013, 2015  . OVARY SURGERY  2001   benign  . SENTINEL NODE BIOPSY Left 03/26/2018   Procedure: SENTINEL NODE BIOPSY;  Surgeon: Robert Bellow, MD;  Location: [facility];  Service: General;  Laterality: Left;  . SIMPLE MASTECTOMY WITH AXILLARY SENTINEL NODE BIOPSY Left 03/26/2018   Procedure: SIMPLE MASTECTOMY;  Surgeon: Robert Bellow, MD;  Location: [facility];  Service: General;  Laterality: Left;  . TOTAL KNEE ARTHROPLASTY  Right 10/12/2016   Procedure: TOTAL KNEE ARTHROPLASTY;  Surgeon: Dereck Leep, MD;  Location: [facility];  Service: Orthopedics;  Laterality: Right;  . VENTRAL HERNIA REPAIR N/A 02/01/2018   Procedure: HERNIA REPAIR VENTRAL ADULT;  Surgeon: Herbert Pun, MD;  Location: [facility];  Service: General;  Laterality: N/A;    Family History  Problem Relation Age of Onset  . Arthritis Mother   . Heart disease Father   . Alzheimer's disease Sister   . Breast cancer Paternal Grandmother     Social History Social History   Tobacco Use  . Smoking status: Former Smoker    Last attempt to quit: 12/05/2009    Years since quitting: 8.3  . Smokeless tobacco: Never Used  Substance Use Topics  . Alcohol use: No    Alcohol/week: 0.0 oz    Comment: rare glass of wine  . Drug use: No    Allergies  Allergen Reactions  . Nitrofurantoin Nausea Only  . Latex Rash    Latex IgE was NEGATIVE (<0.10)  . Levothyroxine Nausea Only    Shaking, nausea, hypertension-can tolerate Synthroid  . Sulfa Antibiotics Rash    Current Outpatient Medications  Medication Sig Dispense Refill  . acetaminophen (TYLENOL) 500 MG tablet Take 500 mg by mouth daily as needed for moderate pain.    Marland Kitchen aspirin EC 81 MG tablet Take 81 mg by mouth at bedtime as  needed (light headed, dizziness).    . calcium carbonate (TUMS EX) 750 MG chewable tablet Chew 2 tablets by mouth daily.    . Cholecalciferol (VITAMIN D) 2000 units tablet Take 4,000 Units by mouth daily.    . Lactobacillus (FLORAJEN ACIDOPHILUS) CAPS Take 1 capsule by mouth every other day. At night    . Menthol, Topical Analgesic, (BLUE-EMU MAXIMUM STRENGTH EX) Apply 1 application topically daily as needed (knee pain).    Marland Kitchen omeprazole (PRILOSEC) 20 MG capsule Take 20 mg by mouth daily as needed (acid reflux).    . SYNTHROID 88 MCG tablet Take 88 mcg by mouth daily.    Marland Kitchen lidocaine-prilocaine (EMLA) cream Apply 1 application topically as needed. Apply to left breast  areola one hour prior to coming to the hospital for your surgery. Cover site with saran wrap. (Patient not taking: Reported on 04/19/2018) 5 g 0   No current facility-administered medications for this visit.     Review of Systems Review of Systems  Constitutional: Negative.   Respiratory: Negative.   Cardiovascular: Negative.     Blood pressure 116/74, pulse 85, resp. rate 18, height '5\' 4"'$  (1.626 m), weight 144 lb (65.3 kg), SpO2 97 %.  Physical Exam Physical Exam  Constitutional: She is oriented to person, place, and time. She appears well-developed and well-nourished.  Pulmonary/Chest:  32 ml aspirated  Musculoskeletal:  Excellent range of motion   Neurological: She is alert and oriented to person, place, and time.  Skin: Skin is warm and dry.  Psychiatric: Her behavior is normal.    Data Reviewed 32 cc seroma volume drained without incident.  Assessment    Post mastectomy seroma, slowly resolving.    Plan    Follow up in 2 weeks     HPI, Physical Exam, Assessment and Plan have been scribed under the direction and in the presence of Robert Bellow, MD. Karie Fetch, RN  I have completed the exam and reviewed the above documentation for accuracy and completeness.  I agree with the above.  Haematologist has been used and any errors in dictation or transcription are unintentional.  Hervey Ard, M.D., F.A.C.S.  Forest Gleason Linton Stolp 04/20/2018, 9:47 PM

## 2018-04-26 ENCOUNTER — Encounter: Payer: Self-pay | Admitting: General Surgery

## 2018-04-26 ENCOUNTER — Ambulatory Visit (INDEPENDENT_AMBULATORY_CARE_PROVIDER_SITE_OTHER): Payer: Medicare Other | Admitting: General Surgery

## 2018-04-26 ENCOUNTER — Telehealth: Payer: Self-pay | Admitting: *Deleted

## 2018-04-26 VITALS — BP 130/74 | HR 68 | Resp 14 | Ht 64.0 in | Wt 145.0 lb

## 2018-04-26 DIAGNOSIS — Z170 Estrogen receptor positive status [ER+]: Secondary | ICD-10-CM

## 2018-04-26 DIAGNOSIS — C50412 Malignant neoplasm of upper-outer quadrant of left female breast: Secondary | ICD-10-CM

## 2018-04-26 NOTE — Progress Notes (Signed)
Patient ID: Aimee Gutierrez, female   DOB: 03-21-1933, 84 y.o.   MRN: [id number]  Chief Complaint  Patient presents with  . Follow-up    HPI Aimee Gutierrez is a 84 y.o. female.  here today because she thinks the fluid has come back some.  The patient was concerned with the upcoming Memorial Day holiday this might become symptomatic.  Left mastectomy done on 03/26/2018.   HPI  Past Medical History:  Diagnosis Date  . Arthritis   . Breast cancer ([place]) 2013   left breast, radiation  . Breast cancer of lower-outer quadrant of right female breast ([place]) 03/09/2015   3.5 mm invasive carcinoma, low-grade DCIS. ER 90%, PR greater than 50%, HER-2/neu not amplified. Axillary sentinel node and radiation deferred based on age..  . Breast cancer, left ([place]) 03/26/2018   8 mm recurrent invasive mammary carcinoma, sentinel node negative.  ER/PR positive, HER-2/neu not overexpressed.  RpT1b rpN0(sn)   . Environmental allergies   . GERD (gastroesophageal reflux disease)   . Hypothyroidism   . IBS (irritable bowel syndrome)    in the past, diverticulitis in the past.  . Osteoporosis   . Personal history of radiation therapy 2016  . Personal history of radiation therapy 2013    Past Surgical History:  Procedure Laterality Date  . BREAST BIOPSY Right 02/17/15   positive  . BREAST BIOPSY Left 03/13/2018   [place] core bx, INVASIVE MAMMARY CARCINOMA  . BREAST LUMPECTOMY Right 2016  . BREAST LUMPECTOMY Left 2013  . BREAST SURGERY Left 2013   [place] Right 03/09/15   wide excision  . CATARACT EXTRACTION  2013, 2015  . OVARY SURGERY  2001   benign  . SENTINEL NODE BIOPSY Left 03/26/2018   Procedure: SENTINEL NODE BIOPSY;  Surgeon: Robert Bellow, MD;  Location: [facility];  Service: General;  Laterality: Left;  . SIMPLE MASTECTOMY WITH AXILLARY SENTINEL NODE BIOPSY Left 03/26/2018   Procedure: SIMPLE MASTECTOMY;  Surgeon: Robert Bellow, MD;  Location: [facility];  Service:  General;  Laterality: Left;  . TOTAL KNEE ARTHROPLASTY Right 10/12/2016   Procedure: TOTAL KNEE ARTHROPLASTY;  Surgeon: Dereck Leep, MD;  Location: [facility];  Service: Orthopedics;  Laterality: Right;  . VENTRAL HERNIA REPAIR N/A 02/01/2018   Procedure: HERNIA REPAIR VENTRAL ADULT;  Surgeon: Herbert Pun, MD;  Location: [facility];  Service: General;  Laterality: N/A;    Family History  Problem Relation Age of Onset  . Arthritis Mother   . Heart disease Father   . Alzheimer's disease Sister   . Breast cancer Paternal Grandmother     Social History Social History   Tobacco Use  . Smoking status: Former Smoker    Last attempt to quit: 12/05/2009    Years since quitting: 8.3  . Smokeless tobacco: Never Used  Substance Use Topics  . Alcohol use: No    Alcohol/week: 0.0 oz    Comment: rare glass of wine  . Drug use: No    Allergies  Allergen Reactions  . Nitrofurantoin Nausea Only  . Latex Rash    Latex IgE was NEGATIVE (<0.10)  . Levothyroxine Nausea Only    Shaking, nausea, hypertension-can tolerate Synthroid  . Sulfa Antibiotics Rash    Current Outpatient Medications  Medication Sig Dispense Refill  . acetaminophen (TYLENOL) 500 MG tablet Take 500 mg by mouth daily as needed for moderate pain.    Marland Kitchen aspirin EC 81 MG tablet Take 81 mg  by mouth at bedtime as needed (light headed, dizziness).    . calcium carbonate (TUMS EX) 750 MG chewable tablet Chew 2 tablets by mouth daily.    . Cholecalciferol (VITAMIN D) 2000 units tablet Take 4,000 Units by mouth daily.    . Lactobacillus (FLORAJEN ACIDOPHILUS) CAPS Take 1 capsule by mouth every other day. At night    . lidocaine-prilocaine (EMLA) cream Apply 1 application topically as needed. Apply to left breast areola one hour prior to coming to the hospital for your surgery. Cover site with saran wrap. 5 g 0  . Menthol, Topical Analgesic, (BLUE-EMU MAXIMUM STRENGTH EX) Apply 1 application topically daily as needed (knee  pain).    Marland Kitchen omeprazole (PRILOSEC) 20 MG capsule Take 20 mg by mouth daily as needed (acid reflux).    . SYNTHROID 88 MCG tablet Take 88 mcg by mouth daily.     No current facility-administered medications for this visit.     Review of Systems Review of Systems  Constitutional: Negative.   Respiratory: Negative.   Cardiovascular: Negative.     Blood pressure 130/74, pulse 68, resp. rate 14, height '5\' 4"'$  (1.626 m), weight 145 lb (65.8 kg).  Physical Exam Physical Exam  Constitutional: She is oriented to person, place, and time. She appears well-developed and well-nourished.  Neurological: She is alert and oriented to person, place, and time.  Skin: Skin is warm and dry.  Drained 26 ml of fluid.  Skin cleansed with ChloraPrep.  1 cc 1% plain Xylocaine (unnecessary).    Assessment    Seroma status post salvage mastectomy.    Plan    Return in two weeks. The patient is aware to call back for any questions or concerns.  HPI, Physical Exam, Assessment and Plan have been scribed under the direction and in the presence of Hervey Ard, MD.  Gaspar Cola, CMA    I have completed the exam and reviewed the above documentation for accuracy and completeness.  I agree with the above.  Haematologist has been used and any errors in dictation or transcription are unintentional.  Hervey Ard, M.D., F.A.C.S.     Forest Gleason Cloria Ciresi 04/27/2018, 6:22 PM

## 2018-04-26 NOTE — Telephone Encounter (Signed)
Patient coming on 04-26-18 at 1:45 pm.

## 2018-04-26 NOTE — Telephone Encounter (Signed)
Patient called and was here on 04/19/18 and have fluid drained from her left mastectomy site. Patient stated that the fluid is building back up again, not painful but burning. She has an appointment to come back on Tuesday  05/01/18 and was wondering if she needs to come in before than.

## 2018-04-26 NOTE — Patient Instructions (Signed)
Return in two weeks. The patient is aware to call back for any questions or concerns.  

## 2018-05-01 ENCOUNTER — Ambulatory Visit: Payer: Medicare Other | Admitting: General Surgery

## 2018-05-03 ENCOUNTER — Ambulatory Visit: Payer: Medicare Other | Admitting: General Surgery

## 2018-05-10 ENCOUNTER — Encounter: Payer: Self-pay | Admitting: General Surgery

## 2018-05-10 ENCOUNTER — Ambulatory Visit (INDEPENDENT_AMBULATORY_CARE_PROVIDER_SITE_OTHER): Payer: Medicare Other | Admitting: General Surgery

## 2018-05-10 VITALS — BP 132/76 | HR 80 | Resp 14 | Ht 67.0 in | Wt 143.0 lb

## 2018-05-10 DIAGNOSIS — N6489 Other specified disorders of breast: Secondary | ICD-10-CM

## 2018-05-10 DIAGNOSIS — Z170 Estrogen receptor positive status [ER+]: Secondary | ICD-10-CM

## 2018-05-10 DIAGNOSIS — C50412 Malignant neoplasm of upper-outer quadrant of left female breast: Secondary | ICD-10-CM

## 2018-05-10 NOTE — Patient Instructions (Addendum)
Return in two weeks. The patient is aware to call back for any questions or concerns.  The patient is aware to use a heating pad as needed for comfort.

## 2018-05-10 NOTE — Progress Notes (Signed)
Patient ID: Aimee Gutierrez, female   DOB: 19-Jul-1933, 84 y.o.   MRN: [id number]  Chief Complaint  Patient presents with  . Follow-up    HPI Aimee Gutierrez is a 84 y.o. female here today for her post op left mastectomy done on 03/26/2018. Patient states she is doing well. Patient states the area looked red and inflamed over the past weekend. (June 1-2). She denied fever, chills or feeling poorly. The redness resolved spontaneously.    HPI  Past Medical History:  Diagnosis Date  . Arthritis   . Breast cancer ([place]) 2013   left breast, radiation  . Breast cancer of lower-outer quadrant of right female breast ([place]) 03/09/2015   3.5 mm invasive carcinoma, low-grade DCIS. ER 90%, PR greater than 50%, HER-2/neu not amplified. Axillary sentinel node and radiation deferred based on age..  . Breast cancer, left ([place]) 03/26/2018   8 mm recurrent invasive mammary carcinoma, sentinel node negative.  ER/PR positive, HER-2/neu not overexpressed.  RpT1b rpN0(sn)   . Environmental allergies   . GERD (gastroesophageal reflux disease)   . Hypothyroidism   . IBS (irritable bowel syndrome)    in the past, diverticulitis in the past.  . Osteoporosis   . Personal history of radiation therapy 2016  . Personal history of radiation therapy 2013    Past Surgical History:  Procedure Laterality Date  . BREAST BIOPSY Right 02/17/15   positive  . BREAST BIOPSY Left 03/13/2018   [place] core bx, INVASIVE MAMMARY CARCINOMA  . BREAST LUMPECTOMY Right 2016  . BREAST LUMPECTOMY Left 2013  . BREAST SURGERY Left 2013   [place] Right 03/09/15   wide excision  . CATARACT EXTRACTION  2013, 2015  . OVARY SURGERY  2001   benign  . SENTINEL NODE BIOPSY Left 03/26/2018   Procedure: SENTINEL NODE BIOPSY;  Surgeon: Robert Bellow, MD;  Location: [facility];  Service: General;  Laterality: Left;  . SIMPLE MASTECTOMY WITH AXILLARY SENTINEL NODE BIOPSY Left 03/26/2018   Procedure: SIMPLE MASTECTOMY;   Surgeon: Robert Bellow, MD;  Location: [facility];  Service: General;  Laterality: Left;  . TOTAL KNEE ARTHROPLASTY Right 10/12/2016   Procedure: TOTAL KNEE ARTHROPLASTY;  Surgeon: Dereck Leep, MD;  Location: [facility];  Service: Orthopedics;  Laterality: Right;  . VENTRAL HERNIA REPAIR N/A 02/01/2018   Procedure: HERNIA REPAIR VENTRAL ADULT;  Surgeon: Herbert Pun, MD;  Location: [facility];  Service: General;  Laterality: N/A;    Family History  Problem Relation Age of Onset  . Arthritis Mother   . Heart disease Father   . Alzheimer's disease Sister   . Breast cancer Paternal Grandmother     Social History Social History   Tobacco Use  . Smoking status: Former Smoker    Last attempt to quit: 12/05/2009    Years since quitting: 8.4  . Smokeless tobacco: Never Used  Substance Use Topics  . Alcohol use: No    Alcohol/week: 0.0 oz    Comment: rare glass of wine  . Drug use: No    Allergies  Allergen Reactions  . Nitrofurantoin Nausea Only  . Latex Rash    Latex IgE was NEGATIVE (<0.10)  . Levothyroxine Nausea Only    Shaking, nausea, hypertension-can tolerate Synthroid  . Sulfa Antibiotics Rash    Current Outpatient Medications  Medication Sig Dispense Refill  . acetaminophen (TYLENOL) 500 MG tablet Take 500 mg by mouth daily as needed for moderate pain.    Marland Kitchen  aspirin EC 81 MG tablet Take 81 mg by mouth at bedtime as needed (light headed, dizziness).    . calcium carbonate (TUMS EX) 750 MG chewable tablet Chew 2 tablets by mouth daily.    . Cholecalciferol (VITAMIN D) 2000 units tablet Take 4,000 Units by mouth daily.    . Lactobacillus (FLORAJEN ACIDOPHILUS) CAPS Take 1 capsule by mouth every other day. At night    . lidocaine-prilocaine (EMLA) cream Apply 1 application topically as needed. Apply to left breast areola one hour prior to coming to the hospital for your surgery. Cover site with saran wrap. 5 g 0  . Menthol, Topical Analgesic, (BLUE-EMU MAXIMUM  STRENGTH EX) Apply 1 application topically daily as needed (knee pain).    Marland Kitchen omeprazole (PRILOSEC) 20 MG capsule Take 20 mg by mouth daily as needed (acid reflux).    . SYNTHROID 88 MCG tablet Take 88 mcg by mouth daily.     No current facility-administered medications for this visit.     Review of Systems Review of Systems  Constitutional: Negative.   Respiratory: Negative.   Cardiovascular: Negative.     Blood pressure 132/76, pulse 80, resp. rate 14, height _0  (1.702 m), weight 143 lb (64.9 kg).  Physical Exam Physical Exam  Constitutional: She is oriented to person, place, and time. She appears well-developed and well-nourished.  Cardiovascular: Normal rate, regular rhythm and normal heart sounds.  Pulmonary/Chest: Effort normal and breath sounds normal.  Neurological: She is alert and oriented to person, place, and time.  Skin: Skin is warm and dry.   Drained 75 ml of fluid afer Chloraprep skin cleansing.  Well tolerated.  Data Reviewed Medical oncology notes of Apr 12, 2018.  Patient undecided on anti-estrogen RX.   Assessment    Seroma post mastectomy.    Plan  Reviewed that post radiation (for breast conservation) and subsequent mastectomy (for recurrence) seroma foundation is not uncommon and is not related to tumor recurrence.  Drainage volume had been less than 30 cc / day when the drain placed at the time of surgery was removed.   Encouraged to call if repeat episode of redness occurs.  Patient to return in two weeks. The patient is aware to use a heating pad as needed for comfort. The patient is aware to call back for any questions or concerns.    HPI, Physical Exam, Assessment and Plan have been scribed under the direction and in the presence of Hervey Ard, MD.  Aimee Gutierrez, CMA  I have completed the exam and reviewed the above documentation for accuracy and completeness.  I agree with the above.  [employer] has been used and any errors  in dictation or transcription are unintentional.  Hervey Ard, M.D., F.A.C.S..  Aimee Gutierrez Aimee Gutierrez 05/11/2018, 8:34 PM

## 2018-05-11 DIAGNOSIS — N6489 Other specified disorders of breast: Secondary | ICD-10-CM | POA: Insufficient documentation

## 2018-05-15 ENCOUNTER — Other Ambulatory Visit: Payer: Self-pay

## 2018-05-15 ENCOUNTER — Encounter: Payer: Self-pay | Admitting: Hematology and Oncology

## 2018-05-15 ENCOUNTER — Inpatient Hospital Stay (HOSPITAL_BASED_OUTPATIENT_CLINIC_OR_DEPARTMENT_OTHER): Payer: Medicare Other | Admitting: Hematology and Oncology

## 2018-05-15 ENCOUNTER — Inpatient Hospital Stay: Payer: Medicare Other | Attending: Hematology and Oncology

## 2018-05-15 VITALS — BP 109/74 | HR 73 | Temp 97.9°F | Resp 18 | Wt 143.0 lb

## 2018-05-15 DIAGNOSIS — Z170 Estrogen receptor positive status [ER+]: Secondary | ICD-10-CM | POA: Insufficient documentation

## 2018-05-15 DIAGNOSIS — K589 Irritable bowel syndrome without diarrhea: Secondary | ICD-10-CM | POA: Insufficient documentation

## 2018-05-15 DIAGNOSIS — R634 Abnormal weight loss: Secondary | ICD-10-CM

## 2018-05-15 DIAGNOSIS — Z9012 Acquired absence of left breast and nipple: Secondary | ICD-10-CM

## 2018-05-15 DIAGNOSIS — K219 Gastro-esophageal reflux disease without esophagitis: Secondary | ICD-10-CM | POA: Insufficient documentation

## 2018-05-15 DIAGNOSIS — D693 Immune thrombocytopenic purpura: Secondary | ICD-10-CM

## 2018-05-15 DIAGNOSIS — Z87891 Personal history of nicotine dependence: Secondary | ICD-10-CM | POA: Diagnosis not present

## 2018-05-15 DIAGNOSIS — Z79899 Other long term (current) drug therapy: Secondary | ICD-10-CM | POA: Diagnosis not present

## 2018-05-15 DIAGNOSIS — Z853 Personal history of malignant neoplasm of breast: Secondary | ICD-10-CM | POA: Diagnosis not present

## 2018-05-15 DIAGNOSIS — E039 Hypothyroidism, unspecified: Secondary | ICD-10-CM | POA: Insufficient documentation

## 2018-05-15 DIAGNOSIS — Z923 Personal history of irradiation: Secondary | ICD-10-CM | POA: Diagnosis not present

## 2018-05-15 DIAGNOSIS — Z7189 Other specified counseling: Secondary | ICD-10-CM

## 2018-05-15 DIAGNOSIS — M1990 Unspecified osteoarthritis, unspecified site: Secondary | ICD-10-CM | POA: Insufficient documentation

## 2018-05-15 DIAGNOSIS — C50911 Malignant neoplasm of unspecified site of right female breast: Secondary | ICD-10-CM

## 2018-05-15 DIAGNOSIS — M810 Age-related osteoporosis without current pathological fracture: Secondary | ICD-10-CM | POA: Insufficient documentation

## 2018-05-15 DIAGNOSIS — R5383 Other fatigue: Secondary | ICD-10-CM | POA: Insufficient documentation

## 2018-05-15 DIAGNOSIS — Z7982 Long term (current) use of aspirin: Secondary | ICD-10-CM

## 2018-05-15 DIAGNOSIS — C50412 Malignant neoplasm of upper-outer quadrant of left female breast: Secondary | ICD-10-CM | POA: Diagnosis present

## 2018-05-15 LAB — CBC WITH DIFFERENTIAL/PLATELET
Basophils Absolute: 0.1 10*3/uL (ref 0–0.1)
Basophils Relative: 2 %
Eosinophils Absolute: 0.1 10*3/uL (ref 0–0.7)
Eosinophils Relative: 2 %
HCT: 41.3 % (ref 35.0–47.0)
Hemoglobin: 13.9 g/dL (ref 12.0–16.0)
Lymphocytes Relative: 20 %
Lymphs Abs: 1 10*3/uL (ref 1.0–3.6)
MCH: 29.6 pg (ref 26.0–34.0)
MCHC: 33.6 g/dL (ref 32.0–36.0)
MCV: 88.3 fL (ref 80.0–100.0)
Monocytes Absolute: 0.4 10*3/uL (ref 0.2–0.9)
Monocytes Relative: 9 %
Neutro Abs: 3.2 10*3/uL (ref 1.4–6.5)
Neutrophils Relative %: 67 %
Platelets: 122 10*3/uL — ABNORMAL LOW (ref 150–440)
RBC: 4.68 MIL/uL (ref 3.80–5.20)
RDW: 15.8 % — ABNORMAL HIGH (ref 11.5–14.5)
WBC: 4.7 10*3/uL (ref 3.6–11.0)

## 2018-05-15 LAB — COMPREHENSIVE METABOLIC PANEL
ALT: 15 U/L (ref 14–54)
AST: 18 U/L (ref 15–41)
Albumin: 4.3 g/dL (ref 3.5–5.0)
Alkaline Phosphatase: 72 U/L (ref 38–126)
Anion gap: 8 (ref 5–15)
BUN: 18 mg/dL (ref 6–20)
CO2: 27 mmol/L (ref 22–32)
Calcium: 9.9 mg/dL (ref 8.9–10.3)
Chloride: 103 mmol/L (ref 101–111)
Creatinine, Ser: 0.91 mg/dL (ref 0.44–1.00)
GFR calc Af Amer: >60 mL/min (ref >60)
GFR calc non Af Amer: 57 mL/min — ABNORMAL LOW (ref >60)
Glucose, Bld: 108 mg/dL — ABNORMAL HIGH (ref 65–99)
Potassium: 4.8 mmol/L (ref 3.5–5.1)
Sodium: 138 mmol/L (ref 135–145)
Total Bilirubin: 0.7 mg/dL (ref 0.3–1.2)
Total Protein: 7.5 g/dL (ref 6.5–8.1)

## 2018-05-15 NOTE — Progress Notes (Signed)
[facility] day:  05/15/2018   Chief Complaint: Aimee Gutierrez is a 84 y.o. female with a history of bilateral breast cancer who is seen for 1 month assessment and further discussions regarding direction of therapy.  HPI: The patient was last seen in the medical oncology clinic on 04/12/2018.  At that time, she was seen for initial visit after left mastectomy.  We discussed initiation of hormonal therapy.  She was undecided and wished to postpone making a decision.  CA27.29 was 8.1.  Symptomatically, she states "I'm good".  She states that she is waiting for her breast to heal up.  Dr Bary Castilla has removed fluid 4 times from her breast (last on Thursday).  She states that she is tired in the afternoon.  Patient is scheduled for knee surgery in 07/04/2018.   Past Medical History:  Diagnosis Date  . Arthritis   . Breast cancer ([place]) 2013   left breast, radiation  . Breast cancer of lower-outer quadrant of right female breast ([place]) 03/09/2015   3.5 mm invasive carcinoma, low-grade DCIS. ER 90%, PR greater than 50%, HER-2/neu not amplified. Axillary sentinel node and radiation deferred based on age..  . Breast cancer, left ([place]) 03/26/2018   8 mm recurrent invasive mammary carcinoma, sentinel node negative.  ER/PR positive, HER-2/neu not overexpressed.  RpT1b rpN0(sn)   . Environmental allergies   . GERD (gastroesophageal reflux disease)   . Hypothyroidism   . IBS (irritable bowel syndrome)    in the past, diverticulitis in the past.  . Osteoporosis   . Personal history of radiation therapy 2016  . Personal history of radiation therapy 2013    Past Surgical History:  Procedure Laterality Date  . BREAST BIOPSY Right 02/17/15   positive  . BREAST BIOPSY Left 03/13/2018   [place] core bx, INVASIVE MAMMARY CARCINOMA  . BREAST LUMPECTOMY Right 2016  . BREAST LUMPECTOMY Left 2013  . BREAST SURGERY Left 2013   [place] Right  03/09/15   wide excision  . CATARACT EXTRACTION  2013, 2015  . OVARY SURGERY  2001   benign  . SENTINEL NODE BIOPSY Left 03/26/2018   Procedure: SENTINEL NODE BIOPSY;  Surgeon: Robert Bellow, MD;  Location: [facility]S;  Service: General;  Laterality: Left;  . SIMPLE MASTECTOMY WITH AXILLARY SENTINEL NODE BIOPSY Left 03/26/2018   Procedure: SIMPLE MASTECTOMY;  Surgeon: Robert Bellow, MD;  Location: [facility];  Service: General;  Laterality: Left;  . TOTAL KNEE ARTHROPLASTY Right 10/12/2016   Procedure: TOTAL KNEE ARTHROPLASTY;  Surgeon: Dereck Leep, MD;  Location: [facility];  Service: Orthopedics;  Laterality: Right;  . VENTRAL HERNIA REPAIR N/A 02/01/2018   Procedure: HERNIA REPAIR VENTRAL ADULT;  Surgeon: Herbert Pun, MD;  Location: [facility];  Service: General;  Laterality: N/A;    Family History  Problem Relation Age of Onset  . Arthritis Mother   . Heart disease Father   . Alzheimer's disease Sister   . Breast cancer Paternal Grandmother     Social History:  reports that she quit smoking about 8 years ago. She has never used smokeless tobacco. She reports that she does not drink alcohol or use drugs.  He son died on 03-29-16 and her husband died on 12-Apr-2016.  The patient is initially alone then later accompanied by Aimee Gutierrez, nurse navigator,  today.    Allergies:  Allergies  Allergen Reactions  . Nitrofurantoin Nausea Only  .  Latex Rash    Latex IgE was NEGATIVE (<0.10)  . Levothyroxine Nausea Only    Shaking, nausea, hypertension-can tolerate Synthroid  . Sulfa Antibiotics Rash    Current Medications: Current Outpatient Medications  Medication Sig Dispense Refill  . Cholecalciferol (VITAMIN D) 2000 units tablet Take 4,000 Units by mouth daily.    . Lactobacillus (FLORAJEN ACIDOPHILUS) CAPS Take 1 capsule by mouth every other day. At night    . SYNTHROID 88 MCG tablet Take 88 mcg by mouth daily.    Marland Kitchen acetaminophen (TYLENOL) 500 MG tablet Take 500 mg by  mouth daily as needed for moderate pain.    Marland Kitchen aspirin EC 81 MG tablet Take 81 mg by mouth at bedtime as needed (light headed, dizziness).    . calcium carbonate (TUMS EX) 750 MG chewable tablet Chew 2 tablets by mouth daily.    . Menthol, Topical Analgesic, (BLUE-EMU MAXIMUM STRENGTH EX) Apply 1 application topically daily as needed (knee pain).     No current facility-administered medications for this visit.     Review of Systems  Constitutional: Positive for weight loss (down 1 pound). Negative for diaphoresis, fever and malaise/fatigue.       "I'm good". Tired in afternoon.  HENT: Negative.  Negative for congestion, nosebleeds and sore throat.   Eyes: Negative.  Negative for blurred vision, double vision and photophobia.  Respiratory: Negative for cough, hemoptysis, sputum production and shortness of breath.   Cardiovascular: Negative for chest pain, palpitations, orthopnea, leg swelling and PND.  Gastrointestinal: Negative for abdominal pain, blood in stool, constipation, diarrhea, melena, nausea and vomiting.       (+) frequent "stomach aches" with certain foods.  Genitourinary: Negative.  Negative for dysuria, frequency, hematuria and urgency.  Musculoskeletal: Positive for joint pain (arthritis in hands. LEFT knee issues). Negative for back pain, falls and myalgias.       Scheduled for knee surgery on 07/04/2018.  Skin: Negative for itching and rash.  Neurological: Negative for dizziness, tremors, weakness and headaches.  Endo/Heme/Allergies: Does not bruise/bleed easily.       HYPOthryroidism on levothyroxine  Psychiatric/Behavioral: Negative for depression, memory loss and suicidal ideas. The patient is not nervous/anxious and does not have insomnia.   All other systems reviewed and are negative.  Physical Exam: Blood pressure 109/74, pulse 73, temperature 97.9 F (36.6 C), temperature source Tympanic, resp. rate 18, weight 143 lb (64.9 kg). GENERAL:  Well developed, well  nourished, woman sitting comfortably in the exam room in no acute distress.  MENTAL STATUS:  Alert and oriented to person, place and time. HEAD:  Short gray hair.  Normocephalic, atraumatic, face symmetric, no Cushingoid features. EYES:  Hazel eyes.  Pupils equal round and reactive to light and accomodation.  No conjunctivitis or scleral icterus. ENT:  Oropharynx clear without lesion.  Tongue normal. Mucous membranes moist.  RESPIRATORY:  Clear to auscultation without rales, wheezes or rhonchi. CARDIOVASCULAR:  Regular rate and rhythm without murmur, rub or gallop. BREAST:  Left mastectomy.  No fluid accumulation.  No erythema or nodularity. ABDOMEN:  Soft, non-tender, with active bowel sounds, and no hepatosplenomegaly.  No masses. SKIN:  No rashes, ulcers or lesions. EXTREMITIES: No edema, no skin discoloration or tenderness.  No palpable cords. LYMPH NODES: No palpable cervical, supraclavicular, axillary or inguinal adenopathy  NEUROLOGICAL: Unremarkable. PSYCH:  Appropriate.    Appointment on 05/15/2018  Component Date Value Ref Range Status  . WBC 05/15/2018 4.7  3.6 - 11.0 K/uL Final  . RBC  05/15/2018 4.68  3.80 - 5.20 MIL/uL Final  . Hemoglobin 05/15/2018 13.9  12.0 - 16.0 g/dL Final  . HCT 05/15/2018 41.3  35.0 - 47.0 % Final  . MCV 05/15/2018 88.3  80.0 - 100.0 fL Final  . MCH 05/15/2018 29.6  26.0 - 34.0 pg Final  . MCHC 05/15/2018 33.6  32.0 - 36.0 g/dL Final  . RDW 05/15/2018 15.8* 11.5 - 14.5 % Final  . Platelets 05/15/2018 122* 150 - 440 K/uL Final  . Neutrophils Relative % 05/15/2018 67  % Final  . Neutro Abs 05/15/2018 3.2  1.4 - 6.5 K/uL Final  . Lymphocytes Relative 05/15/2018 20  % Final  . Lymphs Abs 05/15/2018 1.0  1.0 - 3.6 K/uL Final  . Monocytes Relative 05/15/2018 9  % Final  . Monocytes Absolute 05/15/2018 0.4  0.2 - 0.9 K/uL Final  . Eosinophils Relative 05/15/2018 2  % Final  . Eosinophils Absolute 05/15/2018 0.1  0 - 0.7 K/uL Final  . Basophils Relative  05/15/2018 2  % Final  . Basophils Absolute 05/15/2018 0.1  0 - 0.1 K/uL Final   Performed at [facility], [address], [place], [place] [address]  . Sodium 05/15/2018 138  135 - 145 mmol/L Final  . Potassium 05/15/2018 4.8  3.5 - 5.1 mmol/L Final  . Chloride 05/15/2018 103  101 - 111 mmol/L Final  . CO2 05/15/2018 27  22 - 32 mmol/L Final  . Glucose, Bld 05/15/2018 108* 65 - 99 mg/dL Final  . BUN 05/15/2018 18  6 - 20 mg/dL Final  . Creatinine, Ser 05/15/2018 0.91  0.44 - 1.00 mg/dL Final  . Calcium 05/15/2018 9.9  8.9 - 10.3 mg/dL Final  . Total Protein 05/15/2018 7.5  6.5 - 8.1 g/dL Final  . Albumin 05/15/2018 4.3  3.5 - 5.0 g/dL Final  . AST 05/15/2018 18  15 - 41 U/L Final  . ALT 05/15/2018 15  14 - 54 U/L Final  . Alkaline Phosphatase 05/15/2018 72  38 - 126 U/L Final  . Total Bilirubin 05/15/2018 0.7  0.3 - 1.2 mg/dL Final  . GFR calc non Af Amer 05/15/2018 57* >60 mL/min Final  . GFR calc Af Amer 05/15/2018 >60  >60 mL/min Final   Comment: (NOTE) The eGFR has been calculated using the CKD EPI equation. This calculation has not been validated in all clinical situations. eGFR's persistently <60 mL/min signify possible Chronic Kidney Disease.   Georgiann Hahn gap 05/15/2018 8  5 - 15 Final   Performed at [facility], [address] [place], [place] [address]    Assessment:  CALEAH TORTORELLI is a 84 y.o. female with a history of bilateral breast cancer and chronic mild thrombocytopenia..   She initially presented with stage I left breast cancer s/p lumpectomy and sentinel lymph node biopsy on 03/09/2012. Initial biopsy revealed a 7 mm lesion in the left breast. Pathology revealed a 1 mm focus of residual invasive mammry carcinoma with DCIS. Stage T1aN0M0. Tumor was ER/PR+. Her2/neu was not assessed.  She received radiation from 04/26/2012 to 06/20/2012. She declined hormonal therapy.   Mammogram on 02/13/2014 revealed no evidence of maligancy. Mammogram and  ultrasound on 02/16/2015 revealed a 5 x 3 x 2 mm hypoechoic mass at the 4 o'clock position of the right breast. Core biopsy on 02/17/2015 revealed low grade DCIS, cribiform type. Estrogen receptor was positive (> 90%) and progesterone receptor positive (> 90%).   She underwent wide excision of the right  breast on 03/09/2015. Pathology revealed a 3.5 mm focus of grade II invasive carcinoma with ductal carcinoma in situ. Margins were negative. No lymph nodes were removed. Tumor was ER positive (> 90%), PR positive (51-90%) and Her2/neu negative. Pathologic stage was T1aNxMx. She completed radiation on 05/13/2015.  She decided against hormonal therapy.  Bilateral mammogram on 02/21/2017 revealed stable post lumpectomy changes bilaterally.  Diagnostic mammogram on 03/02/2018 revealing scattered areas of fiber glandular density.  Targeted ultrasound was performed that demonstrated a 6 x 8 x 8 mm irregular hypoechoic LEFT breast mass at the 3 o'clock position, located 3 cm from the nipple.    LEFT breast biopsy on 03/13/2018.  Pathology revealed a grade II invasive mammary carcinoma.  She underwent left simple mastectomy and sentinel lymph node biopsy on 03/26/2018.  Pathology revealed an 8 mm grade II invasive carcinoma of no special type.  DCIS was present.  There was no lymphovascular invasion.  Margins were negative.  Tumor was ER + (> 90%), PR + (1%), and Her2/neu -.  Pathologic stage was rpT1b rpN0 (sn).  CA27.29 has been followed: 24.0 on 04/16/2015, 19.2 on 08/03/2015, 11.8 on 11/10/2015, 14.3 on 04/29/2016, 12.2 on 10/06/2016, 17.7 on 02/23/2017, 13.1 on 05/26/2017, 17 on 10/13/2017, and 8.1 on 04/12/2018.  Bone density study on 05/25/2015 revealed osteoporosis (T score of -3.3 in the femur neck and -3.5 in the forearm). Bone density on 05/25/2017 revealed osteoporosis with a T-score of -3.4 in the left femoral neck and -3.4 in the left forearm radius.  She is taking calcium and vitamin D.   She is not interested in Prolia or Fosamax; readdressed today and patient remains adamant that she does not wish to taking anything.   She has persistent thrombocytopenia c/w mild chronic ITP.  Platelet count has ranged between 96,000 - 161,000 without trend.   Abdominal ultrasound on 01/18/2018 showed a 1.1 cm hyperechoic right liver lobe lesion, felt to be most consistent with a hemangioma.    CT abdomen and pelvis on 01/30/2018 that revealed a 1.2 cm defect within the anterior abdominal wall through which omentum/fat and vessels transverses and appeared inflamed. 1 cm lesion within the posterior right liver lobe again mentioned, and felt to quite possibly a hemangioma.  There was a 1.4 cm lesion at dome of the left lobe of the liver. There was sigmoid diverticulosis with prominent muscular hypertrophy.  Trace free fluid in the right aspect of the pelvis of indeterminate etiology noted.  Small bowel loops that entered the right inguinal canal that did not cause proximal obstruction.  Liver MRI on 02/09/2018 confirmed that the previously demonstrated hepatic lesions were hemangiomas.  There was no evidence of metastatic breast cancer.  There was an enlarging fat-containing ventral hernia that appeared inflamed.  There was questionable incarceration. She underwent ventral hernia repair on 02/01/2018.  Symptomatically, she is healing well s/p left mastectomy.  She has had issues with a seroma post-operatively requiring drainage.  She is scheduled for knee surgery in 07/04/2018.  She wishes to put off tamoxifen until recovery from surgery.  Plan: 1.  Labs today:  CBC with diff, CMP. 2.  Review plans to initiate tamoxifen. Side effects reviewed. Potential side effects (hot flashes, weight gain, cataract formation, thrombosis, uterine cancer) of tamoxifen reviewed in detail.  Discussed requirements associated with this medication, which include annual eye and pelvic examinations. Wishes to hold off until  after her knee surgery.  Multiple questions asked and answered.  3.  Discuss the use  of Prolia to prevent bone thinning/loss associated with hormonal therapy. Patient not interested. 4.  RTC on 08/02/2018 for MD assessment, labs (CBC with diff, CMP), and initiation of tamoxifen.   Nolon Stalls, MD 05/15/18, 2:35 PM

## 2018-05-24 ENCOUNTER — Ambulatory Visit (INDEPENDENT_AMBULATORY_CARE_PROVIDER_SITE_OTHER): Payer: Medicare Other | Admitting: General Surgery

## 2018-05-24 ENCOUNTER — Encounter: Payer: Self-pay | Admitting: General Surgery

## 2018-05-24 VITALS — BP 130/76 | HR 80 | Resp 15 | Ht 66.0 in | Wt 145.0 lb

## 2018-05-24 DIAGNOSIS — Z170 Estrogen receptor positive status [ER+]: Secondary | ICD-10-CM

## 2018-05-24 DIAGNOSIS — C50412 Malignant neoplasm of upper-outer quadrant of left female breast: Secondary | ICD-10-CM

## 2018-05-24 NOTE — Patient Instructions (Signed)
Follow-up in 2 weeks

## 2018-05-24 NOTE — Progress Notes (Signed)
Patient ID: Aimee Gutierrez, female   DOB: 07/02/1933, 84 y.o.   MRN: [id number]  Chief Complaint  Patient presents with  . Routine Post Op    HPI Aimee Gutierrez is a 84 y.o. female here today for post op left mastectomy done on 03/26/18. She states that she is doing well. She states that she does have more fluid build up.  She is scheduled for left knee replacement surgery on 07/04/18. HPI  Past Medical History:  Diagnosis Date  . Arthritis   . Breast cancer ([place]) 2013   left breast, radiation  . Breast cancer of lower-outer quadrant of right female breast ([place]) 03/09/2015   3.5 mm invasive carcinoma, low-grade DCIS. ER 90%, PR greater than 50%, HER-2/neu not amplified. Axillary sentinel node and radiation deferred based on age..  . Breast cancer, left ([place]) 03/26/2018   8 mm recurrent invasive mammary carcinoma, sentinel node negative.  ER/PR positive, HER-2/neu not overexpressed.  RpT1b rpN0(sn)   . Environmental allergies   . GERD (gastroesophageal reflux disease)   . Hypothyroidism   . IBS (irritable bowel syndrome)    in the past, diverticulitis in the past.  . Osteoporosis   . Personal history of radiation therapy 2016  . Personal history of radiation therapy 2013    Past Surgical History:  Procedure Laterality Date  . BREAST BIOPSY Right 02/17/15   positive  . BREAST BIOPSY Left 03/13/2018   [place] core bx, INVASIVE MAMMARY CARCINOMA  . BREAST LUMPECTOMY Right 2016  . BREAST LUMPECTOMY Left 2013  . BREAST SURGERY Left 2013   [place] Right 03/09/15   wide excision  . CATARACT EXTRACTION  2013, 2015  . OVARY SURGERY  2001   benign  . SENTINEL NODE BIOPSY Left 03/26/2018   Procedure: SENTINEL NODE BIOPSY;  Surgeon: Robert Bellow, MD;  Location: [facility];  Service: General;  Laterality: Left;  . SIMPLE MASTECTOMY WITH AXILLARY SENTINEL NODE BIOPSY Left 03/26/2018   Procedure: SIMPLE MASTECTOMY;  Surgeon: Robert Bellow, MD;  Location: [facility];   Service: General;  Laterality: Left;  . TOTAL KNEE ARTHROPLASTY Right 10/12/2016   Procedure: TOTAL KNEE ARTHROPLASTY;  Surgeon: Dereck Leep, MD;  Location: [facility];  Service: Orthopedics;  Laterality: Right;  . VENTRAL HERNIA REPAIR N/A 02/01/2018   Procedure: HERNIA REPAIR VENTRAL ADULT;  Surgeon: Herbert Pun, MD;  Location: [facility];  Service: General;  Laterality: N/A;    Family History  Problem Relation Age of Onset  . Arthritis Mother   . Heart disease Father   . Alzheimer's disease Sister   . Breast cancer Paternal Grandmother     Social History Social History   Tobacco Use  . Smoking status: Former Smoker    Last attempt to quit: 12/05/2009    Years since quitting: 8.4  . Smokeless tobacco: Never Used  Substance Use Topics  . Alcohol use: No    Alcohol/week: 0.0 oz    Comment: rare glass of wine  . Drug use: No    Allergies  Allergen Reactions  . Nitrofurantoin Nausea Only  . Latex Rash    Latex IgE was NEGATIVE (<0.10)  . Levothyroxine Nausea Only    Shaking, nausea, hypertension-can tolerate Synthroid  . Sulfa Antibiotics Rash    Current Outpatient Medications  Medication Sig Dispense Refill  . acetaminophen (TYLENOL) 500 MG tablet Take 500 mg by mouth daily as needed for moderate pain.    Marland Kitchen aspirin EC 81  MG tablet Take 81 mg by mouth at bedtime as needed (light headed, dizziness).    . calcium carbonate (TUMS EX) 750 MG chewable tablet Chew 2 tablets by mouth daily.    . Cholecalciferol (VITAMIN D) 2000 units tablet Take 4,000 Units by mouth daily.    . Lactobacillus (FLORAJEN ACIDOPHILUS) CAPS Take 1 capsule by mouth every other day. At night    . Menthol, Topical Analgesic, (BLUE-EMU MAXIMUM STRENGTH EX) Apply 1 application topically daily as needed (knee pain).    . SYNTHROID 88 MCG tablet Take 88 mcg by mouth daily.     No current facility-administered medications for this visit.     Review of Systems Review of Systems  Constitutional:  Negative.   Respiratory: Negative.   Cardiovascular: Negative.     Blood pressure 130/76, pulse 80, resp. rate 15, height '5\' 6"'$  (1.676 m), weight 145 lb (65.8 kg).  Physical Exam Physical Exam  Constitutional: She appears well-developed and well-nourished.  Eyes: No scleral icterus.  Pulmonary/Chest: Right breast exhibits no inverted nipple, no mass, no nipple discharge, no skin change and no tenderness.  Left mastectomy site healing well. Fluid build up noted. 58 cc of fluid drawn off.   Lymphadenopathy:    She has no cervical adenopathy.    She has no axillary adenopathy.  Skin: Skin is warm and dry.  Psychiatric: She has a normal mood and affect.  Excellent left shoulder range of motion.  Data Reviewed Medical oncology notes.  The patient plans to defer initiation of antiestrogen therapy until after her upcoming knee replacement.  Assessment    Doing well status post salvage mastectomy.  Slow resolution of seroma status post previous whole breast radiation.    Plan    Follow up in 2 weeks     HPI, Physical Exam, Assessment and Plan have been scribed under the direction and in the presence of Robert Bellow, MD  Concepcion Living, LPN  I have completed the exam and reviewed the above documentation for accuracy and completeness.  I agree with the above.  Haematologist has been used and any errors in dictation or transcription are unintentional.  Hervey Ard, M.D., F.A.C.S.  Forest Gleason Peytyn Trine 05/24/2018, 9:25 PM

## 2018-05-25 ENCOUNTER — Telehealth: Payer: Self-pay | Admitting: Podiatry

## 2018-05-25 NOTE — Telephone Encounter (Signed)
Pt called to get an appt..she was seen here many, many years ago (per Pt). She noticed a long streak on her foot yesterday and then this morning she noticed a purple large spot on her foot. I asked pt if she was able to come to our [place] office today because  is closed, pt declined. She said she cant drive all the way to [place]. I told pt she needs to have that seen today at some office if not ours, go to Urgent Care or ED.

## 2018-05-28 ENCOUNTER — Encounter: Payer: Self-pay | Admitting: Podiatry

## 2018-05-28 ENCOUNTER — Ambulatory Visit (INDEPENDENT_AMBULATORY_CARE_PROVIDER_SITE_OTHER): Payer: Medicare Other | Admitting: Podiatry

## 2018-05-28 ENCOUNTER — Ambulatory Visit (INDEPENDENT_AMBULATORY_CARE_PROVIDER_SITE_OTHER): Payer: Medicare Other

## 2018-05-28 DIAGNOSIS — M79672 Pain in left foot: Secondary | ICD-10-CM

## 2018-05-28 DIAGNOSIS — I8390 Asymptomatic varicose veins of unspecified lower extremity: Secondary | ICD-10-CM | POA: Diagnosis not present

## 2018-05-28 DIAGNOSIS — M779 Enthesopathy, unspecified: Secondary | ICD-10-CM

## 2018-05-28 DIAGNOSIS — I83899 Varicose veins of unspecified lower extremities with other complications: Secondary | ICD-10-CM

## 2018-05-28 NOTE — Progress Notes (Signed)
Subjective:  Patient ID: Aimee Gutierrez, female    DOB: 08/07/1933,  MRN: [id number] HPI Chief Complaint  Patient presents with  . Foot Pain    Patient presents today with bruising to foot x 3-4 days.  She reports the bruising started with a streak from her left 2nd toe down to the bottom of her mid foot and progressivly became more bruised.  She reports its tender to touch on bottom of forefoot and where bruising is.  She also reports having a cyst under 2nd left toe x 1-2 weeks.    84 y.o. female presents with the above complaint.   ROS: Denies fever chills nausea vomiting muscle aches pains calf pain back pain chest pain shortness of breath.  Past Medical History:  Diagnosis Date  . Arthritis   . Breast cancer ([facility]) 2013   left breast, radiation  . Breast cancer of lower-outer quadrant of right female breast ([facility]) 03/09/2015   3.5 mm invasive carcinoma, low-grade DCIS. ER 90%, PR greater than 50%, HER-2/neu not amplified. Axillary sentinel node and radiation deferred based on age..  . Breast cancer, left ([place]) 03/26/2018   8 mm recurrent invasive mammary carcinoma, sentinel node negative.  ER/PR positive, HER-2/neu not overexpressed.  RpT1b rpN0(sn)   . Environmental allergies   . GERD (gastroesophageal reflux disease)   . Hypothyroidism   . IBS (irritable bowel syndrome)    in the past, diverticulitis in the past.  . Osteoporosis   . Personal history of radiation therapy 2016  . Personal history of radiation therapy 2013   Past Surgical History:  Procedure Laterality Date  . BREAST BIOPSY Right 02/17/15   positive  . BREAST BIOPSY Left 03/13/2018   [place] core bx, INVASIVE MAMMARY CARCINOMA  . BREAST LUMPECTOMY Right 2016  . BREAST LUMPECTOMY Left 2013  . BREAST SURGERY Left 2013   [place] Right 03/09/15   wide excision  . CATARACT EXTRACTION  2013, 2015  . OVARY SURGERY  2001   benign  . SENTINEL NODE BIOPSY Left 03/26/2018   Procedure: SENTINEL  NODE BIOPSY;  Surgeon: Robert Bellow, MD;  Location: [facility];  Service: General;  Laterality: Left;  . SIMPLE MASTECTOMY WITH AXILLARY SENTINEL NODE BIOPSY Left 03/26/2018   Procedure: SIMPLE MASTECTOMY;  Surgeon: Robert Bellow, MD;  Location: [facility];  Service: General;  Laterality: Left;  . TOTAL KNEE ARTHROPLASTY Right 10/12/2016   Procedure: TOTAL KNEE ARTHROPLASTY;  Surgeon: Dereck Leep, MD;  Location: [facility];  Service: Orthopedics;  Laterality: Right;  . VENTRAL HERNIA REPAIR N/A 02/01/2018   Procedure: HERNIA REPAIR VENTRAL ADULT;  Surgeon: Herbert Pun, MD;  Location: [facility];  Service: General;  Laterality: N/A;    Current Outpatient Medications:  .  acetaminophen (TYLENOL) 500 MG tablet, Take 500 mg by mouth daily as needed for moderate pain., Disp: , Rfl:  .  aspirin EC 81 MG tablet, Take 81 mg by mouth at bedtime as needed (light headed, dizziness)., Disp: , Rfl:  .  calcium carbonate (TUMS EX) 750 MG chewable tablet, Chew 2 tablets by mouth daily., Disp: , Rfl:  .  Cholecalciferol (VITAMIN D) 2000 units tablet, Take 4,000 Units by mouth daily., Disp: , Rfl:  .  Lactobacillus (FLORAJEN ACIDOPHILUS) CAPS, Take 1 capsule by mouth every other day. At night, Disp: , Rfl:  .  Menthol, Topical Analgesic, (BLUE-EMU MAXIMUM STRENGTH EX), Apply 1 application topically daily as needed (knee pain)., Disp: , Rfl:  .  SYNTHROID 88 MCG tablet, Take 88 mcg by mouth daily., Disp: , Rfl:   Allergies  Allergen Reactions  . Nitrofurantoin Nausea Only  . Latex Rash    Latex IgE was NEGATIVE (<0.10)  . Levothyroxine Nausea Only    Shaking, nausea, hypertension-can tolerate Synthroid  . Sulfa Antibiotics Rash   Review of Systems Objective:  There were no vitals filed for this visit.  General: Well developed, nourished, in no acute distress, alert and oriented x3   Dermatological: Skin is warm, dry and supple bilateral. Nails x 10 are well maintained; remaining  integument appears unremarkable at this time. There are no open sores, no preulcerative lesions, no rash or signs of infection present.  Cutaneous evaluation does demonstrate a small cyst that appears to have ruptured beneath the second toe proximal phalanx.  There is bruising in this area and there was a blue streak coming from the cyst headed toward the midfoot via iPhone photograph that she showed [place] today.  No open lesions or wounds.  Vascular: Dorsalis Pedis artery and Posterior Tibial artery pedal pulses are 2/4 bilateral with immedate capillary fill time. Pedal hair growth present. No varicosities and no lower extremity edema present bilateral.   Neruologic: Grossly intact via light touch bilateral. Vibratory intact via tuning fork bilateral. Protective threshold with Semmes Wienstein monofilament intact to all pedal sites bilateral. Patellar and Achilles deep tendon reflexes 2+ bilateral. No Babinski or clonus noted bilateral.   Musculoskeletal: No gross boney pedal deformities bilateral. No pain, crepitus, or limitation noted with foot and ankle range of motion bilateral. Muscular strength 5/5 in all groups tested bilateral.  Gait: Unassisted, Nonantalgic.    Radiographs:  None taken  Assessment & Plan:   Assessment: Ruptured varicosity second toe left foot  Plan: Follow-up with me on an as-needed basis.  Should the cyst recur or the varicosity reform she will notify us immediately.     Max T. [place], [place]

## 2018-06-12 ENCOUNTER — Encounter: Payer: Self-pay | Admitting: General Surgery

## 2018-06-12 ENCOUNTER — Ambulatory Visit (INDEPENDENT_AMBULATORY_CARE_PROVIDER_SITE_OTHER): Payer: Medicare Other | Admitting: General Surgery

## 2018-06-12 VITALS — BP 130/74 | HR 72 | Resp 14 | Ht 66.0 in | Wt 145.0 lb

## 2018-06-12 DIAGNOSIS — N6489 Other specified disorders of breast: Secondary | ICD-10-CM

## 2018-06-12 NOTE — Patient Instructions (Signed)
The patient is aware to call back for any questions or concerns.  

## 2018-06-12 NOTE — Progress Notes (Signed)
Patient ID: Aimee Gutierrez, female   DOB: 12/04/1933, 85 y.o.   MRN: [id number]  Chief Complaint  Patient presents with  . Follow-up    HPI Aimee Gutierrez is a 85 y.o. female here today for her follow up seroma. Patient states the area has some fluid . Knee surgery is scheduled on 07/04/2018 with Dr. Hooten.  HPI  Past Medical History:  Diagnosis Date  . Arthritis   . Breast cancer ([facility]) 2013   left breast, radiation  . 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 ER 90%, PR greater than 50%, HER-2/neu not amplified. Axillary sentinel node and radiation deferred based on age..  . Breast cancer, left ([facility]) 03/26/2018   8 mm recurrent invasive mammary carcinoma, sentinel node negative.  ER/PR positive, HER-2/neu not overexpressed.  RpT1b rpN0(sn)   . Environmental allergies   . GERD (gastroesophageal reflux disease)   . Hypothyroidism   . IBS (irritable bowel syndrome)    in the past, diverticulitis in the past.  . Osteoporosis   . Personal history of radiation therapy 2016  . Personal history of radiation therapy 2013    Past Surgical History:  Procedure Laterality Date  . BREAST BIOPSY Right 02/17/15   positive  . BREAST BIOPSY Left 03/13/2018   [place] core bx, INVASIVE MAMMARY CARCINOMA  . BREAST LUMPECTOMY Right 2016  . BREAST LUMPECTOMY Left 2013  . BREAST SURGERY Left 2013   [facility]  . BREAST SURGERY Right 03/09/15   wide excision  . CATARACT EXTRACTION  2013, 2015  . OVARY SURGERY  2001   benign  . SENTINEL NODE BIOPSY Left 03/26/2018   Procedure: SENTINEL NODE BIOPSY;  Surgeon: Byrnett, Jeffrey W, MD;  Location: [facility];  Service: General;  Laterality: Left;  . SIMPLE MASTECTOMY WITH AXILLARY SENTINEL NODE BIOPSY Left 03/26/2018   Procedure: SIMPLE MASTECTOMY;  Surgeon: Byrnett, Jeffrey W, MD;  Location: [facility];  Service: General;  Laterality: Left;  . TOTAL KNEE ARTHROPLASTY Right  10/12/2016   Procedure: TOTAL KNEE ARTHROPLASTY;  Surgeon: James P Hooten, MD;  Location: [facility];  Service: Orthopedics;  Laterality: Right;  . VENTRAL HERNIA REPAIR N/A 02/01/2018   Procedure: HERNIA REPAIR VENTRAL ADULT;  Surgeon: Cintron-Diaz, Edgardo, MD;  Location: [facility];  Service: General;  Laterality: N/A;    Family History  Problem Relation Age of Onset  . Arthritis Mother   . Heart disease Father   . Alzheimer's disease Sister   . Breast cancer Paternal Grandmother     Social History Social History   Tobacco Use  . Smoking status: Former Smoker    Last attempt to quit: 12/05/2009    Years since quitting: 8.5  . Smokeless tobacco: Never Used  Substance Use Topics  . Alcohol use: No    Alcohol/week: 0.0 oz    Comment: rare glass of wine  . Drug use: No    Allergies  Allergen Reactions  . Nitrofurantoin Nausea Only  . Latex Rash    Latex IgE was NEGATIVE (<0.10)  . Levothyroxine Nausea Only    Shaking, nausea, hypertension-can tolerate Synthroid  . Sulfa Antibiotics Rash    Current Outpatient Medications  Medication Sig Dispense Refill  . acetaminophen (TYLENOL) 500 MG tablet Take 500 mg by mouth daily as needed for moderate pain.    . aspirin EC 81 MG tablet Take 81 mg by mouth at bedtime as needed (light headed, dizziness).    .   calcium carbonate (TUMS EX) 750 MG chewable tablet Chew 2 tablets by mouth daily.    . Cholecalciferol (VITAMIN D) 2000 units tablet Take 4,000 Units by mouth daily.    . Lactobacillus (FLORAJEN ACIDOPHILUS) CAPS Take 1 capsule by mouth every other day. At night    . Menthol, Topical Analgesic, (BLUE-EMU MAXIMUM STRENGTH EX) Apply 1 application topically daily as needed (knee pain).    . SYNTHROID 88 MCG tablet Take 88 mcg by mouth daily.     No current facility-administered medications for this visit.     Review of Systems Review of Systems  Constitutional: Negative.   Respiratory: Negative.   Cardiovascular: Negative.      Blood pressure 130/74, pulse 72, resp. rate 14, height 5' 6" (1.676 m), weight 145 lb (65.8 kg).  Physical Exam Physical Exam  Constitutional: She is oriented to person, place, and time. She appears well-developed and well-nourished.  Eyes: Conjunctivae are normal. No scleral icterus.  Cardiovascular: Normal rate, regular rhythm and normal heart sounds.  Pulmonary/Chest: Effort normal and breath sounds normal.    Neurological: She is alert and oriented to person, place, and time.  Skin: Skin is warm and dry.    Data Reviewed ChloraPrep followed by 1% Xylocaine and ultrasound guidance of aspiration completed.  Assessment    Steady resolution of postmastectomy seroma.    Plan  Follow up pending recovery from her upcoming knee surgery.    HPI, Physical Exam, Assessment and Plan have been scribed under the direction and in the presence of Jeffrey Byrnett, MD.  Jessica Qualls, CMA'  I have completed the exam and reviewed the above documentation for accuracy and completeness.  I agree with the above.  [employer] has been used and any errors in dictation or transcription are unintentional.  Jeffrey Byrnett, M.D., F.A.C.S.  Jeffrey W Byrnett 06/13/2018, 8:21 AM   

## 2018-06-17 NOTE — Discharge Instructions (Signed)
°  Instructions after Total Knee Replacement ° ° Sigrid Schwebach P. Lennie Dunnigan, Jr., M.D.    ° Dept. of Orthopaedics & Sports Medicine ° [facility] ° [address] ° [place], [place]  [address] ° Phone: [phone]   Fax: [phone] ° °  °DIET: °• Drink plenty of non-alcoholic fluids. °• Resume your normal diet. Include foods high in fiber. ° °ACTIVITY:  °• You may use crutches or a walker with weight-bearing as tolerated, unless instructed otherwise. °• You may be weaned off of the walker or crutches by your Physical Therapist.  °• Do NOT place pillows under the knee. Anything placed under the knee could limit your ability to straighten the knee.   °• Continue doing gentle exercises. Exercising will reduce the pain and swelling, increase motion, and prevent muscle weakness.   °• Please continue to use the TED compression stockings for 6 weeks. You may remove the stockings at night, but should reapply them in the morning. °• Do not drive or operate any equipment until instructed. ° °WOUND CARE:  °• Continue to use the PolarCare or ice packs periodically to reduce pain and swelling. °• You may bathe or shower after the staples are removed at the first office visit following surgery. ° °MEDICATIONS: °• You may resume your regular medications. °• Please take the pain medication as prescribed on the medication. °• Do not take pain medication on an empty stomach. °• You have been given a prescription for a blood thinner (Lovenox or Coumadin). Please take the medication as instructed. (NOTE: After completing a 2 week course of Lovenox, take one Enteric-coated aspirin once a day. This along with elevation will help reduce the possibility of phlebitis in your operated leg.) °• Do not drive or drink alcoholic beverages when taking pain medications. ° °CALL THE OFFICE FOR: °• Temperature above 101 degrees °• Excessive bleeding or drainage on the dressing. °• Excessive swelling, coldness, or paleness of the toes. °• Persistent  nausea and vomiting. ° °FOLLOW-UP:  °• You should have an appointment to return to the office in 10-14 days after surgery. °• Arrangements have been made for continuation of Physical Therapy (either home therapy or outpatient therapy). °  °

## 2018-06-20 ENCOUNTER — Other Ambulatory Visit: Payer: Self-pay | Admitting: Podiatry

## 2018-06-20 DIAGNOSIS — M779 Enthesopathy, unspecified: Secondary | ICD-10-CM

## 2018-06-21 ENCOUNTER — Other Ambulatory Visit: Payer: Self-pay

## 2018-06-21 ENCOUNTER — Encounter
Admission: RE | Admit: 2018-06-21 | Discharge: 2018-06-21 | Disposition: A | Discharge status: Home / Self Care | Payer: Medicare Other | Source: Ambulatory Visit | Attending: Orthopedic Surgery | Admitting: Orthopedic Surgery

## 2018-06-21 DIAGNOSIS — M1712 Unilateral primary osteoarthritis, left knee: Secondary | ICD-10-CM | POA: Diagnosis not present

## 2018-06-21 DIAGNOSIS — E039 Hypothyroidism, unspecified: Secondary | ICD-10-CM | POA: Insufficient documentation

## 2018-06-21 DIAGNOSIS — Z79890 Hormone replacement therapy (postmenopausal): Secondary | ICD-10-CM | POA: Diagnosis not present

## 2018-06-21 DIAGNOSIS — K219 Gastro-esophageal reflux disease without esophagitis: Secondary | ICD-10-CM | POA: Insufficient documentation

## 2018-06-21 DIAGNOSIS — Z79899 Other long term (current) drug therapy: Secondary | ICD-10-CM | POA: Insufficient documentation

## 2018-06-21 DIAGNOSIS — Z87891 Personal history of nicotine dependence: Secondary | ICD-10-CM | POA: Diagnosis not present

## 2018-06-21 DIAGNOSIS — Z01818 Encounter for other preprocedural examination: Secondary | ICD-10-CM | POA: Insufficient documentation

## 2018-06-21 LAB — CBC
HEMATOCRIT: 40.7 % (ref 35.0–47.0)
HEMOGLOBIN: 13.8 g/dL (ref 12.0–16.0)
MCH: 30.1 pg (ref 26.0–34.0)
MCHC: 34 g/dL (ref 32.0–36.0)
MCV: 88.7 fL (ref 80.0–100.0)
PLATELETS: 129 10*3/uL — AB (ref 150–440)
RBC: 4.59 MIL/uL (ref 3.80–5.20)
RDW: 15.2 % — ABNORMAL HIGH (ref 11.5–14.5)
WBC: 4.2 10*3/uL (ref 3.6–11.0)

## 2018-06-21 LAB — COMPREHENSIVE METABOLIC PANEL
ALT: 17 U/L (ref 0–44)
AST: 20 U/L (ref 15–41)
Albumin: 4 g/dL (ref 3.5–5.0)
Alkaline Phosphatase: 69 U/L (ref 38–126)
Anion gap: 6 (ref 5–15)
BUN: 15 mg/dL (ref 8–23)
CHLORIDE: 105 mmol/L (ref 98–111)
CO2: 30 mmol/L (ref 22–32)
CREATININE: 0.73 mg/dL (ref 0.44–1.00)
Calcium: 9.9 mg/dL (ref 8.9–10.3)
GFR calc Af Amer: >60 mL/min (ref >60)
GFR calc non Af Amer: >60 mL/min (ref >60)
GLUCOSE: 107 mg/dL — AB (ref 70–99)
Potassium: 4.1 mmol/L (ref 3.5–5.1)
SODIUM: 141 mmol/L (ref 135–145)
Total Bilirubin: 0.6 mg/dL (ref 0.3–1.2)
Total Protein: 7.3 g/dL (ref 6.5–8.1)

## 2018-06-21 LAB — TYPE AND SCREEN
ABO/RH(D): O POS
Antibody Screen: NEGATIVE

## 2018-06-21 LAB — URINALYSIS, ROUTINE W REFLEX MICROSCOPIC
Bilirubin Urine: NEGATIVE
GLUCOSE, UA: NEGATIVE mg/dL
Hgb urine dipstick: NEGATIVE
Ketones, ur: NEGATIVE mg/dL
LEUKOCYTES UA: NEGATIVE
NITRITE: NEGATIVE
PROTEIN: NEGATIVE mg/dL
Specific Gravity, Urine: 1.015 (ref 1.005–1.030)
pH: 5 (ref 5.0–8.0)

## 2018-06-21 LAB — SURGICAL PCR SCREEN
MRSA, PCR: NEGATIVE
STAPHYLOCOCCUS AUREUS: NEGATIVE

## 2018-06-21 LAB — C-REACTIVE PROTEIN: CRP: <0.8 mg/dL (ref <1.0)

## 2018-06-21 LAB — PROTIME-INR
INR: 0.94
Prothrombin Time: 12.5 seconds (ref 11.4–15.2)

## 2018-06-21 LAB — APTT: APTT: 36 s (ref 24–36)

## 2018-06-21 LAB — SEDIMENTATION RATE: Sed Rate: 13 mm/h (ref 0–30)

## 2018-06-21 NOTE — Patient Instructions (Signed)
Your procedure is scheduled on: Wed. 7/31 Report to Day Surgery. To find out your arrival time please call [phone] between 1PM - 3PM on Tues 7/30.  Remember: Instructions that are not followed completely may result in serious medical risk,  up to and including death, or upon the discretion of your surgeon and anesthesiologist your  surgery may need to be rescheduled.     _X__ 1. Do not eat food after midnight the night before your procedure.                 No gum chewing or hard candies. You may drink clear liquids up to 2 hours                 before you are scheduled to arrive for your surgery- DO not drink clear                 liquids within 2 hours of the start of your surgery.                 Clear Liquids include:  water, apple juice without pulp, clear carbohydrate                 drink such as Clearfast of Gatorade, Black Coffee or Tea (Do not add                 anything to coffee or tea).  __X__2.  On the morning of surgery brush your teeth with toothpaste and water, you                may rinse your mouth with mouthwash if you wish.  Do not swallow any toothpaste of mouthwash.     _X__ 3.  No Alcohol for 24 hours before or after surgery.   ___ 4.  Do Not Smoke or use e-cigarettes For 24 Hours Prior to Your Surgery.                 Do not use any chewable tobacco products for at least 6 hours prior to                 surgery.  ____  5.  Bring all medications with you on the day of surgery if instructed.   __x__  6.  Notify your doctor if there is any change in your medical condition      (cold, fever, infections).     Do not wear jewelry, make-up, hairpins, clips or nail polish. Do not wear lotions, powders, or perfumes. You may wear deodorant. Do not shave 48 hours prior to surgery. Men may shave face and neck. Do not bring valuables to the hospital.    [facility] is not responsible for any belongings or valuables.  Contacts, dentures  or bridgework may not be worn into surgery. Leave your suitcase in the car. After surgery it may be brought to your room. For patients admitted to the hospital, discharge time is determined by your treatment team.   Patients discharged the day of surgery will not be allowed to drive home.   Please read over the following fact sheets that you were given:   MRSA Information   ___x_ Take these medicines the morning of surgery with A SIP OF WATER:    1. SYNTHROID 88 MCG tablet  2. omeprazole (PRILOSEC) 20 MG capsule extra dose the night before and morning of surgery  3.   4.  5.  6.  ____ Nile Dear  Enema (as directed)   __x__ Use CHG Soap as directed  ____ Use inhalers on the day of surgery  ____ Stop metformin 2 days prior to surgery    ____ Take 1/2 of usual insulin dose the night before surgery. No insulin the morning          of surgery.   ____ Stop Coumadin/Plavix/aspirin on   ____ Stop Anti-inflammatories on    ____ Stop supplements until after surgery.    ____ Bring C-Pap to the hospital.

## 2018-06-22 LAB — URINE CULTURE
CULTURE: NO GROWTH
SPECIAL REQUESTS: NORMAL

## 2018-07-03 MED ORDER — TRANEXAMIC ACID 1000 MG/10ML IV SOLN
1000.0000 mg | INTRAVENOUS | Status: DC
  Filled 2018-07-03: qty 10

## 2018-07-03 MED ORDER — CEFAZOLIN SODIUM-DEXTROSE 2-4 GM/100ML-% IV SOLN: 2.0000 g | INTRAVENOUS | Status: DC

## 2018-07-04 ENCOUNTER — Inpatient Hospital Stay
Admission: RE | Admit: 2018-07-04 | Discharge: 2018-07-06 | DRG: 470 | Disposition: A | Discharge status: Home Health | Payer: Medicare Other | Attending: Orthopedic Surgery | Admitting: Orthopedic Surgery

## 2018-07-04 ENCOUNTER — Inpatient Hospital Stay: Payer: Medicare Other | Admitting: Anesthesiology

## 2018-07-04 ENCOUNTER — Inpatient Hospital Stay: Payer: Medicare Other

## 2018-07-04 ENCOUNTER — Other Ambulatory Visit: Payer: Self-pay

## 2018-07-04 ENCOUNTER — Encounter: Payer: Self-pay | Admitting: Orthopedic Surgery

## 2018-07-04 ENCOUNTER — Encounter
Admission: RE | Disposition: A | Discharge status: Home Health | Payer: Self-pay | Source: Home / Self Care | Attending: Orthopedic Surgery

## 2018-07-04 DIAGNOSIS — Z9841 Cataract extraction status, right eye: Secondary | ICD-10-CM | POA: Diagnosis not present

## 2018-07-04 DIAGNOSIS — M25762 Osteophyte, left knee: Secondary | ICD-10-CM | POA: Diagnosis present

## 2018-07-04 DIAGNOSIS — Z79899 Other long term (current) drug therapy: Secondary | ICD-10-CM

## 2018-07-04 DIAGNOSIS — E039 Hypothyroidism, unspecified: Secondary | ICD-10-CM | POA: Diagnosis present

## 2018-07-04 DIAGNOSIS — Z853 Personal history of malignant neoplasm of breast: Secondary | ICD-10-CM

## 2018-07-04 DIAGNOSIS — E7800 Pure hypercholesterolemia, unspecified: Secondary | ICD-10-CM | POA: Diagnosis present

## 2018-07-04 DIAGNOSIS — M1712 Unilateral primary osteoarthritis, left knee: Secondary | ICD-10-CM | POA: Diagnosis present

## 2018-07-04 DIAGNOSIS — Z888 Allergy status to other drugs, medicaments and biological substances status: Secondary | ICD-10-CM

## 2018-07-04 DIAGNOSIS — Z91040 Latex allergy status: Secondary | ICD-10-CM

## 2018-07-04 DIAGNOSIS — Z8249 Family history of ischemic heart disease and other diseases of the circulatory system: Secondary | ICD-10-CM

## 2018-07-04 DIAGNOSIS — Z882 Allergy status to sulfonamides status: Secondary | ICD-10-CM | POA: Diagnosis not present

## 2018-07-04 DIAGNOSIS — Z803 Family history of malignant neoplasm of breast: Secondary | ICD-10-CM | POA: Diagnosis not present

## 2018-07-04 DIAGNOSIS — I739 Peripheral vascular disease, unspecified: Secondary | ICD-10-CM | POA: Diagnosis present

## 2018-07-04 DIAGNOSIS — Z9012 Acquired absence of left breast and nipple: Secondary | ICD-10-CM

## 2018-07-04 DIAGNOSIS — K589 Irritable bowel syndrome without diarrhea: Secondary | ICD-10-CM | POA: Diagnosis present

## 2018-07-04 DIAGNOSIS — K219 Gastro-esophageal reflux disease without esophagitis: Secondary | ICD-10-CM | POA: Diagnosis present

## 2018-07-04 DIAGNOSIS — Z87891 Personal history of nicotine dependence: Secondary | ICD-10-CM

## 2018-07-04 DIAGNOSIS — Z90710 Acquired absence of both cervix and uterus: Secondary | ICD-10-CM

## 2018-07-04 DIAGNOSIS — Z791 Long term (current) use of non-steroidal anti-inflammatories (NSAID): Secondary | ICD-10-CM | POA: Diagnosis not present

## 2018-07-04 DIAGNOSIS — M810 Age-related osteoporosis without current pathological fracture: Secondary | ICD-10-CM | POA: Diagnosis present

## 2018-07-04 DIAGNOSIS — Z923 Personal history of irradiation: Secondary | ICD-10-CM | POA: Diagnosis not present

## 2018-07-04 DIAGNOSIS — Z9842 Cataract extraction status, left eye: Secondary | ICD-10-CM

## 2018-07-04 DIAGNOSIS — Z96659 Presence of unspecified artificial knee joint: Secondary | ICD-10-CM

## 2018-07-04 HISTORY — PX: KNEE ARTHROPLASTY: SHX992

## 2018-07-04 SURGERY — ARTHROPLASTY, KNEE, TOTAL, USING IMAGELESS COMPUTER-ASSISTED NAVIGATION
Anesthesia: General | Site: Knee | Laterality: Left | Wound class: Clean

## 2018-07-04 MED ORDER — SODIUM CHLORIDE 0.9 % IV SOLN
INTRAVENOUS | Status: DC
  Administered 2018-07-04: 20:00:00 via INTRAVENOUS

## 2018-07-04 MED ORDER — ONDANSETRON HCL 4 MG/2ML IJ SOLN: 4.0000 mg | Freq: Four times a day (QID) | INTRAMUSCULAR | Status: DC | PRN

## 2018-07-04 MED ORDER — TETRACAINE HCL 1 % IJ SOLN
INTRAMUSCULAR | Status: AC
  Filled 2018-07-04: qty 2

## 2018-07-04 MED ORDER — ONDANSETRON HCL 4 MG PO TABS: 4.0000 mg | ORAL_TABLET | Freq: Four times a day (QID) | ORAL | Status: DC | PRN

## 2018-07-04 MED ORDER — DIPHENHYDRAMINE HCL 12.5 MG/5ML PO ELIX: 12.5000 mg | ORAL_SOLUTION | ORAL | Status: DC | PRN

## 2018-07-04 MED ORDER — PANTOPRAZOLE SODIUM 40 MG PO TBEC
40.0000 mg | DELAYED_RELEASE_TABLET | Freq: Two times a day (BID) | ORAL | Status: DC
  Administered 2018-07-04 – 2018-07-06 (×4): 40 mg via ORAL
  Filled 2018-07-04 (×4): qty 1

## 2018-07-04 MED ORDER — KETAMINE HCL 50 MG/ML IJ SOLN
INTRAMUSCULAR | Status: DC | PRN
  Administered 2018-07-04: 50 mg via INTRAVENOUS
  Administered 2018-07-04: 50 mg via INTRAMUSCULAR

## 2018-07-04 MED ORDER — PHENYLEPHRINE HCL 10 MG/ML IJ SOLN
INTRAMUSCULAR | Status: DC | PRN
  Administered 2018-07-04 (×2): 100 ug via INTRAVENOUS
  Administered 2018-07-04: 50 ug via INTRAVENOUS

## 2018-07-04 MED ORDER — OXYCODONE HCL 5 MG PO TABS
10.0000 mg | ORAL_TABLET | ORAL | Status: DC | PRN
  Administered 2018-07-05: 10 mg via ORAL
  Filled 2018-07-04: qty 2

## 2018-07-04 MED ORDER — ACETAMINOPHEN 10 MG/ML IV SOLN
1000.0000 mg | Freq: Four times a day (QID) | INTRAVENOUS | Status: AC
  Administered 2018-07-04 – 2018-07-05 (×4): 1000 mg via INTRAVENOUS
  Filled 2018-07-04 (×4): qty 100

## 2018-07-04 MED ORDER — FLEET ENEMA 7-19 GM/118ML RE ENEM: 1.0000 | ENEMA | Freq: Once | RECTAL | Status: DC | PRN

## 2018-07-04 MED ORDER — MENTHOL 3 MG MT LOZG
1.0000 | LOZENGE | OROMUCOSAL | Status: DC | PRN
  Filled 2018-07-04: qty 9

## 2018-07-04 MED ORDER — VITAMIN D 1000 UNITS PO TABS
4000.0000 [IU] | ORAL_TABLET | Freq: Every day | ORAL | Status: DC
  Administered 2018-07-04 – 2018-07-05 (×2): 4000 [IU] via ORAL
  Filled 2018-07-04 (×2): qty 4

## 2018-07-04 MED ORDER — SODIUM CHLORIDE 0.9 % IV SOLN
INTRAVENOUS | Status: DC | PRN
  Administered 2018-07-04: 1000 mg via INTRAVENOUS

## 2018-07-04 MED ORDER — CELECOXIB 200 MG PO CAPS
400.0000 mg | ORAL_CAPSULE | Freq: Once | ORAL | Status: AC
  Administered 2018-07-04: 400 mg via ORAL

## 2018-07-04 MED ORDER — NEOMYCIN-POLYMYXIN B GU 40-200000 IR SOLN
Status: DC | PRN
  Administered 2018-07-04: 14 mL

## 2018-07-04 MED ORDER — CEFAZOLIN SODIUM-DEXTROSE 2-4 GM/100ML-% IV SOLN
2.0000 g | Freq: Four times a day (QID) | INTRAVENOUS | Status: AC
  Administered 2018-07-05 (×3): 2 g via INTRAVENOUS
  Filled 2018-07-04 (×4): qty 100

## 2018-07-04 MED ORDER — BUPIVACAINE HCL (PF) 0.25 % IJ SOLN
INTRAMUSCULAR | Status: DC | PRN
  Administered 2018-07-04: 60 mL

## 2018-07-04 MED ORDER — ONDANSETRON HCL 4 MG/2ML IJ SOLN: 4.0000 mg | Freq: Once | INTRAMUSCULAR | Status: DC | PRN

## 2018-07-04 MED ORDER — ALUM & MAG HYDROXIDE-SIMETH 200-200-20 MG/5ML PO SUSP
30.0000 mL | ORAL | Status: DC | PRN
  Administered 2018-07-05: 30 mL via ORAL
  Filled 2018-07-04: qty 30

## 2018-07-04 MED ORDER — PHENOL 1.4 % MT LIQD
1.0000 | OROMUCOSAL | Status: DC | PRN
  Filled 2018-07-04: qty 177

## 2018-07-04 MED ORDER — ACETAMINOPHEN 10 MG/ML IV SOLN
INTRAVENOUS | Status: DC | PRN
  Administered 2018-07-04: 1000 mg via INTRAVENOUS

## 2018-07-04 MED ORDER — DEXAMETHASONE SODIUM PHOSPHATE 10 MG/ML IJ SOLN
INTRAMUSCULAR | Status: AC
  Administered 2018-07-04: 8 mg via INTRAVENOUS
  Filled 2018-07-04: qty 1

## 2018-07-04 MED ORDER — CHLORHEXIDINE GLUCONATE 4 % EX LIQD: 60.0000 mL | Freq: Once | CUTANEOUS | Status: DC

## 2018-07-04 MED ORDER — GABAPENTIN 300 MG PO CAPS
300.0000 mg | ORAL_CAPSULE | Freq: Every day | ORAL | Status: DC
  Administered 2018-07-04 – 2018-07-05 (×2): 300 mg via ORAL
  Filled 2018-07-04 (×2): qty 1

## 2018-07-04 MED ORDER — HYDROMORPHONE HCL 1 MG/ML IJ SOLN: 0.5000 mg | INTRAMUSCULAR | Status: DC | PRN

## 2018-07-04 MED ORDER — CEFAZOLIN SODIUM-DEXTROSE 2-4 GM/100ML-% IV SOLN
INTRAVENOUS | Status: AC
  Administered 2018-07-04: 2000 mg
  Filled 2018-07-04: qty 100

## 2018-07-04 MED ORDER — GABAPENTIN 300 MG PO CAPS
ORAL_CAPSULE | ORAL | Status: AC
  Administered 2018-07-04: 300 mg via ORAL
  Filled 2018-07-04: qty 1

## 2018-07-04 MED ORDER — CELECOXIB 200 MG PO CAPS
200.0000 mg | ORAL_CAPSULE | Freq: Two times a day (BID) | ORAL | Status: DC
  Administered 2018-07-04 – 2018-07-06 (×4): 200 mg via ORAL
  Filled 2018-07-04 (×4): qty 1

## 2018-07-04 MED ORDER — SODIUM CHLORIDE FLUSH 0.9 % IV SOLN
INTRAVENOUS | Status: AC
  Filled 2018-07-04: qty 30

## 2018-07-04 MED ORDER — CELECOXIB 200 MG PO CAPS
ORAL_CAPSULE | ORAL | Status: AC
  Administered 2018-07-04: 400 mg via ORAL
  Filled 2018-07-04: qty 2

## 2018-07-04 MED ORDER — PROPOFOL 500 MG/50ML IV EMUL
INTRAVENOUS | Status: AC
  Filled 2018-07-04: qty 50

## 2018-07-04 MED ORDER — PROPOFOL 500 MG/50ML IV EMUL
INTRAVENOUS | Status: DC | PRN
  Administered 2018-07-04: 50 ug/kg/min via INTRAVENOUS

## 2018-07-04 MED ORDER — BUPIVACAINE HCL (PF) 0.5 % IJ SOLN
INTRAMUSCULAR | Status: DC | PRN
  Administered 2018-07-04: 3 mL

## 2018-07-04 MED ORDER — ENOXAPARIN SODIUM 30 MG/0.3ML ~~LOC~~ SOLN
30.0000 mg | Freq: Two times a day (BID) | SUBCUTANEOUS | Status: DC
  Administered 2018-07-05 – 2018-07-06 (×3): 30 mg via SUBCUTANEOUS
  Filled 2018-07-04 (×3): qty 0.3

## 2018-07-04 MED ORDER — KETAMINE HCL 50 MG/ML IJ SOLN
INTRAMUSCULAR | Status: AC
  Filled 2018-07-04: qty 10

## 2018-07-04 MED ORDER — TRAMADOL HCL 50 MG PO TABS
50.0000 mg | ORAL_TABLET | ORAL | Status: DC | PRN
  Administered 2018-07-04 – 2018-07-05 (×2): 100 mg via ORAL
  Administered 2018-07-05: 50 mg via ORAL
  Administered 2018-07-05: 100 mg via ORAL
  Filled 2018-07-04: qty 1
  Filled 2018-07-04 (×3): qty 2

## 2018-07-04 MED ORDER — BUPIVACAINE HCL (PF) 0.25 % IJ SOLN
INTRAMUSCULAR | Status: AC
  Filled 2018-07-04: qty 60

## 2018-07-04 MED ORDER — LEVOTHYROXINE SODIUM 88 MCG PO TABS
88.0000 ug | ORAL_TABLET | Freq: Every day | ORAL | Status: DC
  Administered 2018-07-05 – 2018-07-06 (×2): 88 ug via ORAL
  Filled 2018-07-04 (×2): qty 1

## 2018-07-04 MED ORDER — LACTATED RINGERS IV SOLN
INTRAVENOUS | Status: DC
  Administered 2018-07-04 (×3): via INTRAVENOUS

## 2018-07-04 MED ORDER — CALCIUM CARBONATE ANTACID 500 MG PO CHEW
3.0000 | CHEWABLE_TABLET | Freq: Every day | ORAL | Status: DC
  Administered 2018-07-04 – 2018-07-05 (×2): 600 mg via ORAL
  Filled 2018-07-04 (×2): qty 3

## 2018-07-04 MED ORDER — SENNOSIDES-DOCUSATE SODIUM 8.6-50 MG PO TABS
1.0000 | ORAL_TABLET | Freq: Two times a day (BID) | ORAL | Status: DC
  Administered 2018-07-04 – 2018-07-06 (×4): 1 via ORAL
  Filled 2018-07-04 (×4): qty 1

## 2018-07-04 MED ORDER — BISACODYL 10 MG RE SUPP
10.0000 mg | Freq: Every day | RECTAL | Status: DC | PRN
  Administered 2018-07-06: 10 mg via RECTAL
  Filled 2018-07-04: qty 1

## 2018-07-04 MED ORDER — RISAQUAD PO CAPS
1.0000 | ORAL_CAPSULE | ORAL | Status: DC
  Filled 2018-07-04 (×2): qty 1

## 2018-07-04 MED ORDER — MAGNESIUM HYDROXIDE 400 MG/5ML PO SUSP
30.0000 mL | Freq: Every day | ORAL | Status: DC
  Administered 2018-07-04 – 2018-07-06 (×3): 30 mL via ORAL
  Filled 2018-07-04 (×3): qty 30

## 2018-07-04 MED ORDER — METOCLOPRAMIDE HCL 10 MG PO TABS
10.0000 mg | ORAL_TABLET | Freq: Three times a day (TID) | ORAL | Status: DC
  Administered 2018-07-04 – 2018-07-06 (×6): 10 mg via ORAL
  Filled 2018-07-04 (×7): qty 1

## 2018-07-04 MED ORDER — TRANEXAMIC ACID 1000 MG/10ML IV SOLN
1000.0000 mg | Freq: Once | INTRAVENOUS | Status: AC
  Administered 2018-07-04: 1000 mg via INTRAVENOUS
  Filled 2018-07-04: qty 1100

## 2018-07-04 MED ORDER — ACETAMINOPHEN 325 MG PO TABS: 325.0000 mg | ORAL_TABLET | Freq: Four times a day (QID) | ORAL | Status: DC | PRN

## 2018-07-04 MED ORDER — GABAPENTIN 300 MG PO CAPS
300.0000 mg | ORAL_CAPSULE | Freq: Once | ORAL | Status: AC
  Administered 2018-07-04: 300 mg via ORAL

## 2018-07-04 MED ORDER — BUPIVACAINE LIPOSOME 1.3 % IJ SUSP
INTRAMUSCULAR | Status: DC | PRN
  Administered 2018-07-04: 60 mL

## 2018-07-04 MED ORDER — CEFAZOLIN SODIUM-DEXTROSE 2-3 GM-%(50ML) IV SOLR
INTRAVENOUS | Status: DC | PRN
  Administered 2018-07-04: 2 g via INTRAVENOUS

## 2018-07-04 MED ORDER — OXYCODONE HCL 5 MG PO TABS
5.0000 mg | ORAL_TABLET | ORAL | Status: DC | PRN
  Administered 2018-07-05 – 2018-07-06 (×2): 5 mg via ORAL
  Filled 2018-07-04 (×2): qty 1

## 2018-07-04 MED ORDER — METOCLOPRAMIDE HCL 5 MG/ML IJ SOLN: 5.0000 mg | Freq: Three times a day (TID) | INTRAMUSCULAR | Status: DC | PRN

## 2018-07-04 MED ORDER — ACETAMINOPHEN 10 MG/ML IV SOLN
INTRAVENOUS | Status: AC
  Filled 2018-07-04: qty 100

## 2018-07-04 MED ORDER — FENTANYL CITRATE (PF) 100 MCG/2ML IJ SOLN: 25.0000 ug | INTRAMUSCULAR | Status: DC | PRN

## 2018-07-04 MED ORDER — METOCLOPRAMIDE HCL 10 MG PO TABS
5.0000 mg | ORAL_TABLET | Freq: Three times a day (TID) | ORAL | Status: DC | PRN
  Administered 2018-07-04: 10 mg via ORAL

## 2018-07-04 MED ORDER — FERROUS SULFATE 325 (65 FE) MG PO TABS
325.0000 mg | ORAL_TABLET | Freq: Two times a day (BID) | ORAL | Status: DC
  Administered 2018-07-05 – 2018-07-06 (×3): 325 mg via ORAL
  Filled 2018-07-04 (×3): qty 1

## 2018-07-04 MED ORDER — DEXAMETHASONE SODIUM PHOSPHATE 10 MG/ML IJ SOLN
8.0000 mg | Freq: Once | INTRAMUSCULAR | Status: AC
  Administered 2018-07-04: 8 mg via INTRAVENOUS

## 2018-07-04 SURGICAL SUPPLY — 68 items
BATTERY INSTRU NAVIGATION (MISCELLANEOUS) ×12 IMPLANT
BLADE SAGITTAL 25.0X1.19X90 (BLADE) IMPLANT
BLADE SAGITTAL 25.0X1.19X90MM (BLADE)
BLADE SAW 1/2 (BLADE) ×3 IMPLANT
BLADE SAW 70X12.5 (BLADE) IMPLANT
BLADE SAW 90X13X1.19 OSCILLAT (BLADE) ×3 IMPLANT
CANISTER SUCT 1200ML W/VALVE (MISCELLANEOUS) ×3 IMPLANT
CANISTER SUCT 3000ML PPV (MISCELLANEOUS) ×6 IMPLANT
CAPT KNEE TOTAL 3 ATTUNE ×3 IMPLANT
CEMENT HV SMART SET (Cement) ×6 IMPLANT
COOLER POLAR GLACIER W/PUMP (MISCELLANEOUS) ×3 IMPLANT
CUFF TOURN 24 STER (MISCELLANEOUS) ×3 IMPLANT
CUFF TOURN 30 STER DUAL PORT (MISCELLANEOUS) IMPLANT
DRAPE SHEET LG 3/4 BI-LAMINATE (DRAPES) ×3 IMPLANT
DRSG DERMACEA 8X12 NADH (GAUZE/BANDAGES/DRESSINGS) ×3 IMPLANT
DRSG OPSITE POSTOP 4X14 (GAUZE/BANDAGES/DRESSINGS) ×3 IMPLANT
DRSG TEGADERM 4X4.75 (GAUZE/BANDAGES/DRESSINGS) ×3 IMPLANT
DURAPREP 26ML APPLICATOR (WOUND CARE) ×6 IMPLANT
ELECT CAUTERY BLADE 6.4 (BLADE) ×3 IMPLANT
ELECT REM PT RETURN 9FT ADLT (ELECTROSURGICAL) ×3
ELECTRODE REM PT RTRN 9FT ADLT (ELECTROSURGICAL) ×1 IMPLANT
EX-PIN ORTHOLOCK NAV 4X150 (PIN) ×6 IMPLANT
GLOVE BIOGEL M STRL SZ7.5 (GLOVE) ×6 IMPLANT
GLOVE BIOGEL PI IND STRL 7.0 (GLOVE) ×6 IMPLANT
GLOVE BIOGEL PI IND STRL 9 (GLOVE) ×1 IMPLANT
GLOVE BIOGEL PI INDICATOR 7.0 (GLOVE) ×12
GLOVE BIOGEL PI INDICATOR 9 (GLOVE) ×2
GLOVE INDICATOR 8.0 STRL GRN (GLOVE) ×3 IMPLANT
GLOVE SURG SYN 9.0  PF PI (GLOVE) ×2
GLOVE SURG SYN 9.0 PF PI (GLOVE) ×1 IMPLANT
GOWN STRL REUS W/ TWL LRG LVL3 (GOWN DISPOSABLE) ×4 IMPLANT
GOWN STRL REUS W/TWL 2XL LVL3 (GOWN DISPOSABLE) ×3 IMPLANT
GOWN STRL REUS W/TWL LRG LVL3 (GOWN DISPOSABLE) ×8
HEMOVAC 400CC 10FR (MISCELLANEOUS) ×3 IMPLANT
HOLDER FOLEY CATH W/STRAP (MISCELLANEOUS) ×3 IMPLANT
HOOD PEEL AWAY FLYTE STAYCOOL (MISCELLANEOUS) ×6 IMPLANT
KIT TURNOVER KIT A (KITS) ×3 IMPLANT
KNIFE SCULPS 14X20 (INSTRUMENTS) ×3 IMPLANT
LABEL OR SOLS (LABEL) ×3 IMPLANT
NDL SAFETY ECLIPSE 18X1.5 (NEEDLE) ×1 IMPLANT
NEEDLE HYPO 18GX1.5 SHARP (NEEDLE) ×2
NEEDLE SPNL 20GX3.5 QUINCKE YW (NEEDLE) ×6 IMPLANT
NS IRRIG 500ML POUR BTL (IV SOLUTION) ×3 IMPLANT
PACK TOTAL KNEE (MISCELLANEOUS) ×3 IMPLANT
PAD WRAPON POLAR KNEE (MISCELLANEOUS) ×1 IMPLANT
PIN DRILL QUICK PACK ×3 IMPLANT
PIN FIXATION 1/8DIA X 3INL (PIN) ×9 IMPLANT
PULSAVAC PLUS IRRIG FAN TIP (DISPOSABLE) ×3
SOL .9 NS 3000ML IRR  AL (IV SOLUTION) ×2
SOL .9 NS 3000ML IRR UROMATIC (IV SOLUTION) ×1 IMPLANT
SOL PREP PVP 2OZ (MISCELLANEOUS) ×3
SOLUTION PREP PVP 2OZ (MISCELLANEOUS) ×1 IMPLANT
SPONGE DRAIN TRACH 4X4 STRL 2S (GAUZE/BANDAGES/DRESSINGS) ×3 IMPLANT
STAPLER SKIN PROX 35W (STAPLE) ×3 IMPLANT
STRAP TIBIA SHORT (MISCELLANEOUS) ×3 IMPLANT
SUCTION FRAZIER HANDLE 10FR (MISCELLANEOUS) ×2
SUCTION TUBE FRAZIER 10FR DISP (MISCELLANEOUS) ×1 IMPLANT
SUT VIC AB 0 CT1 36 (SUTURE) ×3 IMPLANT
SUT VIC AB 1 CT1 36 (SUTURE) ×6 IMPLANT
SUT VIC AB 2-0 CT2 27 (SUTURE) ×3 IMPLANT
SYR 20CC LL (SYRINGE) ×3 IMPLANT
SYR 30ML LL (SYRINGE) ×6 IMPLANT
TIP FAN IRRIG PULSAVAC PLUS (DISPOSABLE) ×1 IMPLANT
TOWEL OR 17X26 4PK STRL BLUE (TOWEL DISPOSABLE) ×3 IMPLANT
TOWER CARTRIDGE SMART MIX (DISPOSABLE) ×3 IMPLANT
TRAY FOLEY MTR SLVR 16FR STAT (SET/KITS/TRAYS/PACK) IMPLANT
TRAY FOLEY SLVR 16FR LF STAT (SET/KITS/TRAYS/PACK) ×3 IMPLANT
WRAPON POLAR PAD KNEE (MISCELLANEOUS) ×3

## 2018-07-04 NOTE — Anesthesia Preprocedure Evaluation (Signed)
Anesthesia Evaluation  Patient identified by MRN, date of birth, ID band Patient awake    Reviewed: Allergy & Precautions, H&P , NPO status , Patient's Chart, lab work & pertinent test results, reviewed documented beta blocker date and time   Airway Mallampati: II   Neck ROM: full    Dental  (+) Poor Dentition   Pulmonary neg pulmonary ROS, former smoker,    Pulmonary exam normal        Cardiovascular Exercise Tolerance: Good + Peripheral Vascular Disease  negative cardio ROS Normal cardiovascular exam Rhythm:regular Rate:Normal     Neuro/Psych negative neurological ROS  negative psych ROS   GI/Hepatic negative GI ROS, Neg liver ROS, GERD  Medicated,  Endo/Other  negative endocrine ROSHypothyroidism   Renal/GU negative Renal ROS  negative genitourinary   Musculoskeletal   Abdominal   Peds  Hematology negative hematology ROS (+)   Anesthesia Other Findings Past Medical History: No date: Arthritis 2013: Breast cancer ([facility])     Comment:  left breast, radiation 03/09/2015: Breast cancer of lower-outer quadrant of right female  breast ([place])     Comment:  3.5 mm invasive carcinoma, low-grade DCIS. ER 90%, PR               greater than 50%, HER-2/neu not amplified. Axillary               sentinel node and radiation deferred based on age.Marland Kitchen 03/26/2018: Breast cancer, left ([place])     Comment:  8 mm recurrent invasive mammary carcinoma, sentinel node              negative.  ER/PR positive, HER-2/neu not overexpressed.                RpT1b rpN0(sn)  No date: Environmental allergies No date: GERD (gastroesophageal reflux disease) No date: Hypothyroidism No date: IBS (irritable bowel syndrome)     Comment:  in the past, diverticulitis in the past. No date: Osteoporosis 2016: Personal history of radiation therapy 2013: Personal history of radiation therapy Past Surgical History: No date: ABDOMINAL HYSTERECTOMY  Comment:  1969 02/17/15: BREAST BIOPSY; Right     Comment:  positive 03/13/2018: BREAST BIOPSY; Left     Comment:  [place] core bx, INVASIVE MAMMARY CARCINOMA 2016: BREAST LUMPECTOMY; Right 2013: BREAST LUMPECTOMY; Left 2013: BREAST SURGERY; Left     Comment:  Natasha Mead 03/09/15: BREAST SURGERY; Right     Comment:  wide excision 2013, 2015: CATARACT EXTRACTION No date: JOINT REPLACEMENT     Comment:  right knee 2001: OVARY SURGERY     Comment:  benign 03/26/2018: SENTINEL NODE BIOPSY; Left     Comment:  Procedure: SENTINEL NODE BIOPSY;  Surgeon: Robert Bellow, MD;  Location: [facility];  Service: General;                Laterality: Left; 03/26/2018: SIMPLE MASTECTOMY WITH AXILLARY SENTINEL NODE BIOPSY; Left     Comment:  Procedure: SIMPLE MASTECTOMY;  Surgeon: Robert Bellow, MD;  Location: [facility];  Service: General;                Laterality: Left; 10/12/2016: TOTAL KNEE ARTHROPLASTY; Right     Comment:  Procedure: TOTAL KNEE ARTHROPLASTY;  Surgeon: Dereck Leep, MD;  Location: [facility];  Service: Orthopedics;                Laterality: Right; 02/01/2018: VENTRAL HERNIA REPAIR; N/A     Comment:  Procedure: HERNIA REPAIR VENTRAL ADULT;  Surgeon:               Herbert Pun, MD;  Location: [facility];  Service:              General;  Laterality: N/A;   Reproductive/Obstetrics negative OB ROS                             Anesthesia Physical Anesthesia Plan  ASA: II  Anesthesia Plan: General and Spinal   Post-op Pain Management:    Induction:   PONV Risk Score and Plan:   Airway Management Planned:   Additional Equipment:   Intra-op Plan:   Post-operative Plan:   Informed Consent: I have reviewed the patients History and Physical, chart, labs and discussed the procedure including the risks, benefits and alternatives for the proposed anesthesia with the patient or authorized representative  who has indicated his/her understanding and acceptance.   Dental Advisory Given  Plan Discussed with: CRNA  Anesthesia Plan Comments:         Anesthesia Quick Evaluation

## 2018-07-04 NOTE — Transfer of Care (Signed)
Immediate Anesthesia Transfer of Care Note  Patient: Aimee Gutierrez  Procedure(s) Performed: COMPUTER ASSISTED TOTAL KNEE ARTHROPLASTY (Left Knee)  Patient Location: PACU  Anesthesia Type:Spinal  Level of Consciousness: awake  Airway & Oxygen Therapy: Patient connected to face mask oxygen  Post-op Assessment: Post -op Vital signs reviewed and stable  Post vital signs: stable  Last Vitals:  Vitals Value Taken Time  BP 141/83 07/04/2018  6:05 PM  Temp    Pulse 96 07/04/2018  6:05 PM  Resp 17 07/04/2018  6:05 PM  SpO2 96 % 07/04/2018  6:05 PM  Vitals shown include unvalidated device data.  Last Pain:  Vitals:   07/04/18 1343  TempSrc: Temporal  PainSc: 0-No pain         Complications: No apparent anesthesia complications

## 2018-07-04 NOTE — H&P (Signed)
The patient has been re-examined, and the chart reviewed, and there have been no interval changes to the documented history and physical.    The risks, benefits, and alternatives have been discussed at length. The patient expressed understanding of the risks benefits and agreed with plans for surgical intervention.  Bina Veenstra P. Junia Nygren, Jr. M.D.    

## 2018-07-04 NOTE — Op Note (Signed)
OPERATIVE NOTE  DATE OF SURGERY:  07/04/2018  PATIENT NAME:  Aimee Gutierrez   DOB: [birth year]  MRN: [id number]  PRE-OPERATIVE DIAGNOSIS: Degenerative arthrosis of the left knee, primary  POST-OPERATIVE DIAGNOSIS:  Same  PROCEDURE:  Left total knee arthroplasty using computer-assisted navigation  SURGEON:  Marciano Sequin. M.D.  ASSISTANT:  Vance Peper, PA (present and scrubbed throughout the case, critical for assistance with exposure, retraction, instrumentation, and closure)  ANESTHESIA: spinal  ESTIMATED BLOOD LOSS: 50 mL  FLUIDS REPLACED: 1300 mL of crystalloid  TOURNIQUET TIME: 74 minutes  DRAINS: 2 medium Hemovac drains  SOFT TISSUE RELEASES: Anterior cruciate ligament, posterior cruciate ligament, deep medial collateral ligament, patellofemoral ligament  IMPLANTS UTILIZED: DePuy Attune size 5 posterior stabilized femoral component (cemented), size 7 rotating platform tibial component (cemented), 38 mm medialized dome patella (cemented), and a 5 mm stabilized rotating platform polyethylene insert.  INDICATIONS FOR SURGERY: Aimee Gutierrez is a 84 y.o. year old female with a long history of progressive knee pain. X-rays demonstrated severe degenerative changes in tricompartmental fashion. The patient had not seen any significant improvement despite conservative nonsurgical intervention. After discussion of the risks and benefits of surgical intervention, the patient expressed understanding of the risks benefits and agree with plans for total knee arthroplasty.   The risks, benefits, and alternatives were discussed at length including but not limited to the risks of infection, bleeding, nerve injury, stiffness, blood clots, the need for revision surgery, cardiopulmonary complications, among others, and they were willing to proceed.  PROCEDURE IN DETAIL: The patient was brought into the operating room and, after adequate spinal anesthesia was achieved, a tourniquet was placed on the  patient's upper thigh. The patient's knee and leg were cleaned and prepped with alcohol and DuraPrep and draped in the usual sterile fashion. A "timeout" was performed as per usual protocol. The lower extremity was exsanguinated using an Esmarch, and the tourniquet was inflated to 300 mmHg. An anterior longitudinal incision was made followed by a standard mid vastus approach. The deep fibers of the medial collateral ligament were elevated in a subperiosteal fashion off of the medial flare of the tibia so as to maintain a continuous soft tissue sleeve. The patella was subluxed laterally and the patellofemoral ligament was incised. Inspection of the knee demonstrated severe degenerative changes with full-thickness loss of articular cartilage. Osteophytes were debrided using a rongeur. Anterior and posterior cruciate ligaments were excised. Two 4.0 mm Schanz pins were inserted in the femur and into the tibia for attachment of the array of trackers used for computer-assisted navigation. Hip center was identified using a circumduction technique. Distal landmarks were mapped using the computer. The distal femur and proximal tibia were mapped using the computer. The distal femoral cutting guide was positioned using computer-assisted navigation so as to achieve a 5 distal valgus cut. The femur was sized and it was felt that a size 5 femoral component was appropriate. A size 5 femoral cutting guide was positioned and the anterior cut was performed and verified using the computer. This was followed by completion of the posterior and chamfer cuts. Femoral cutting guide for the central box was then positioned in the center box cut was performed.  Attention was then directed to the proximal tibia. Medial and lateral menisci were excised. The extramedullary tibial cutting guide was positioned using computer-assisted navigation so as to achieve a 0 varus-valgus alignment and 3 posterior slope. The cut was performed and  verified using the computer. The proximal tibia  was sized and it was felt that a size 7 tibial tray was appropriate. Tibial and femoral trials were inserted followed by insertion of a 5 mm polyethylene insert. This allowed for excellent mediolateral soft tissue balancing both in flexion and in full extension. Finally, the patella was cut and prepared so as to accommodate a 38 mm medialized dome patella. A patella trial was placed and the knee was placed through a range of motion with excellent patellar tracking appreciated. The femoral trial was removed after debridement of posterior osteophytes. The central post-hole for the tibial component was reamed followed by insertion of a keel punch. Tibial trials were then removed. Cut surfaces of bone were irrigated with copious amounts of normal saline with antibiotic solution using pulsatile lavage and then suctioned dry. Polymethylmethacrylate cement was prepared in the usual fashion using a vacuum mixer. Cement was applied to the cut surface of the proximal tibia as well as along the undersurface of a size 7 rotating platform tibial component. Tibial component was positioned and impacted into place. Excess cement was removed using [employer] fast streamer. Cement was then applied to the cut surfaces of the femur as well as along the posterior flanges of the size 5 femoral component. The femoral component was positioned and impacted into place. Excess cement was removed using [employer] fast streamer. A 5 mm polyethylene trial was inserted and the knee was brought into full extension with steady axial compression applied. Finally, cement was applied to the backside of a 38 mm medialized dome patella and the patellar component was positioned and patellar clamp applied. Excess cement was removed using [employer] fast streamer. After adequate curing of the cement, the tourniquet was deflated after a total tourniquet time of 74 minutes. Hemostasis was achieved using electrocautery. The knee was  irrigated with copious amounts of normal saline with antibiotic solution using pulsatile lavage and then suctioned dry. 20 mL of 1.3% Exparel and 60 mL of 0.25% Marcaine in 40 mL of normal saline was injected along the posterior capsule, medial and lateral gutters, and along the arthrotomy site. A 5 mm stabilized rotating platform polyethylene insert was inserted and the knee was placed through a range of motion with excellent mediolateral soft tissue balancing appreciated and excellent patellar tracking noted. 2 medium drains were placed in the wound bed and brought out through separate stab incisions. The medial parapatellar portion of the incision was reapproximated using interrupted sutures of #1 Vicryl. Subcutaneous tissue was approximated in layers using first #0 Vicryl followed #2-0 Vicryl. The skin was approximated with skin staples. A sterile dressing was applied.  The patient tolerated the procedure well and was transported to the recovery room in stable condition.    James P. Holley Bouche., M.D.

## 2018-07-04 NOTE — Anesthesia Post-op Follow-up Note (Signed)
Anesthesia QCDR form completed.        

## 2018-07-04 NOTE — Anesthesia Procedure Notes (Addendum)
Spinal  Start time: 07/04/2018 3:05 PM End time: 07/04/2018 3:09 PM Staffing Anesthesiologist: Molli Barrows, MD Resident/CRNA: Aline Brochure, CRNA Performed: resident/CRNA  Preanesthetic Checklist Completed: patient identified, site marked, surgical consent, pre-op evaluation, IV checked, risks and benefits discussed and monitors and equipment checked Spinal Block Patient position: sitting Prep: ChloraPrep and site prepped and draped Patient monitoring: heart rate, continuous pulse ox and blood pressure Approach: midline Location: L3-4 Injection technique: single-shot Needle Needle type: Pencan  Needle gauge: 24 G

## 2018-07-05 ENCOUNTER — Other Ambulatory Visit: Payer: Self-pay

## 2018-07-05 ENCOUNTER — Encounter: Payer: Self-pay | Admitting: Orthopedic Surgery

## 2018-07-05 NOTE — Progress Notes (Signed)
Physical Therapy Treatment Patient Details Name: Aimee Gutierrez MRN: [id number] DOB: 02-28-1933 Today's Date: 07/05/2018    History of Present Illness Pt. is an 84 y.o. female who was admitted to [facility] for a Left TKR secondary to degenerative Arthritis. Pt. PMHx includes: Right TKR in 2017, Left Breast CA s/p mastectomy, hernia repair, PVD, Bilateral breast lumpectomy.    PT Comments    Pt able to progress to ambulating 120 feet with RW CGA; pt motivated to ambulate requiring cueing to not over do things (pt reporting feeling generalized weakness towards end of session but improved once resting in bed; HR and O2 sats WFL).  Pain L knee 0/10 beginning of session resting in chair and 2-3/10 end of session resting in bed.  Will continue to progress pt with strengthening, knee ROM, and progressive functional mobility per pt tolerance.    Follow Up Recommendations  Home health PT     Equipment Recommendations  Rolling walker with 5" wheels    Recommendations for Other Services       Precautions / Restrictions Precautions Precautions: Fall;Knee Precaution Booklet Issued: Yes (comment) Required Braces or Orthoses: Knee Immobilizer - Left Knee Immobilizer - Left: Discontinue once straight leg raise with < 10 degree lag Restrictions Weight Bearing Restrictions: Yes LLE Weight Bearing: Weight bearing as tolerated    Mobility  Bed Mobility Overal bed mobility: Needs Assistance Bed Mobility: Sit to Supine       Sit to supine: Supervision;HOB elevated   General bed mobility comments: mild increased effort to perform on own  Transfers Overall transfer level: Needs assistance Equipment used: Rolling walker (2 wheeled) Transfers: Sit to/from Stand Sit to Stand: Min guard         General transfer comment: vc's required for UE and LE positioning and technique for transfers occasionally  Ambulation/Gait Ambulation/Gait assistance: Min guard Gait Distance (Feet): 120 Feet Assistive  device: Rolling walker (2 wheeled)   Gait velocity: decreased   General Gait Details: decreased stance time L LE; vc's to increased UE support through RW to offweight L LE; steady; no knee buckling noted   Stairs             Wheelchair Mobility    Modified Rankin (Stroke Patients Only)       Balance Overall balance assessment: Needs assistance Sitting-balance support: No upper extremity supported;Feet supported Sitting balance-Leahy Scale: Normal Sitting balance - Comments: steady sitting reaching outside BOS   Standing balance support: Single extremity supported Standing balance-Leahy Scale: Poor Standing balance comment: pt requires at least single UE support for static standing balance                            Cognition Arousal/Alertness: Awake/alert Behavior During Therapy: WFL for tasks assessed/performed Overall Cognitive Status: Within Functional Limits for tasks assessed                                        Exercises Total Joint Exercises Long Arc Quad: AROM;Strengthening;Left;10 reps;Seated Knee Flexion: AROM;Strengthening;Left;10 reps;Seated    General Comments General comments (skin integrity, edema, etc.): Hemovac and polar care in place.  Pt agreeable to PT session.  Pt's daughter present entire session.      Pertinent Vitals/Pain Pain Assessment: 0-10 Pain Score: 3  Pain Location: Left Knee Pain Descriptors / Indicators: Sore Pain Intervention(s): Limited activity within patient's  tolerance;Monitored during session;Premedicated before session;Repositioned;Other (comment)(polar care applied)  Vitals (HR and O2 on room air) stable and WFL throughout treatment session.    Home Living                      Prior Function            PT Goals (current goals can now be found in the care plan section) Acute Rehab PT Goals Patient Stated Goal: To return home PT Goal Formulation: With patient Time For Goal  Achievement: 07/19/18 Potential to Achieve Goals: Good Progress towards PT goals: Progressing toward goals    Frequency    BID      PT Plan Current plan remains appropriate    Co-evaluation              AM-PAC PT "6 Clicks" Daily Activity  Outcome Measure  Difficulty turning over in bed (including adjusting bedclothes, sheets and blankets)?: A Little Difficulty moving from lying on back to sitting on the side of the bed? : A Lot Difficulty sitting down on and standing up from a chair with arms (e.g., wheelchair, bedside commode, etc,.)?: Unable Help needed moving to and from a bed to chair (including a wheelchair)?: A Little Help needed walking in hospital room?: A Little Help needed climbing 3-5 steps with a railing? : A Little 6 Click Score: 15    End of Session Equipment Utilized During Treatment: Gait belt Activity Tolerance: Patient tolerated treatment well Patient left: in bed;with call bell/phone within reach;with bed alarm set;with family/visitor present;with SCD's reapplied;Other (comment)(L heel elevated in bone foam; R heel elevated via towel roll; polar care in place and activated) Nurse Communication: Mobility status;Precautions;Weight bearing status PT Visit Diagnosis: Other abnormalities of gait and mobility (R26.89);Muscle weakness (generalized) (M62.81);Difficulty in walking, not elsewhere classified (R26.2);Pain Pain - Right/Left: Left Pain - part of body: Knee     Time: 3662-9476 PT Time Calculation (min) (ACUTE ONLY): 40 min  Charges:  $Gait Training: 8-22 mins $Therapeutic Exercise: 8-22 mins $Therapeutic Activity: 8-22 mins                    Leitha Bleak, PT 07/05/18, 3:19 PM [phone]

## 2018-07-05 NOTE — Evaluation (Signed)
Occupational Therapy Evaluation Patient Details Name: Aimee Gutierrez MRN: [id number] DOB: 11/18/1933 Today's Date: 07/05/2018    History of Present Illness Pt. is an 84 y.o. female who was admitted to [facility] for a Left TKR secondary to degenerative Arthritis. Pt. PMHx includes: Right TKR in 2017, Left Breast CA s/p mastectomy, hernia repair, PVD, Bilateral breast lumpectomy.   Clinical Impression   Pt. is an 84 y.o. female who was admitted to [facility] for a Left TKR. Pt. resides at home alone. Pt. was independent with ADLs, and IADL functioning: including meal preparation, medication management, and driving. Pt. education was provided about OT services, and A/E use for LE ADLs. Pt. and daughter report the pt. is familiar with the equipment, and how to use it from the previous right knee surgery. Pt. and her daughter have a plan in place for ADL care at home. Pt. education was provided about options for washing her hair at home until she can shower. Pt. was provided with a no rinse hair care shower cap, towels, and washcloths. Pt. and her daughter have no further OT related questions, or concerns at this time. No follow-up OT services are warranted at this time. Pt. and family are in agreement at this time.   Follow Up Recommendations  No OT follow up    Equipment Recommendations       Recommendations for Other Services       Precautions / Restrictions Precautions Precautions: Fall;Knee Precaution Booklet Issued: Yes (comment) Required Braces or Orthoses: Knee Immobilizer - Left Knee Immobilizer - Left: Discontinue once straight leg raise with < 10 degree lag Restrictions Weight Bearing Restrictions: Yes LLE Weight Bearing: Weight bearing as tolerated      Mobility Bed Mobility Overal bed mobility: Needs Assistance Bed Mobility: Supine to Sit     Supine to sit: Min guard;HOB elevated     General bed mobility comments: mild increased effort to perform on own; CGA of L LE for  safety  Transfers            Balance                           ADL either performed or assessed with clinical judgement   ADL Overall ADL's : Needs assistance/impaired Eating/Feeding: Set up;Independent   Grooming: Set up;Independent   Upper Body Bathing: Set up;Independent   Lower Body Bathing: Minimal assistance;Set up   Upper Body Dressing : Set up;Independent   Lower Body Dressing: Set up;Minimal assistance                 General ADL Comments: Pt. education was provided about A/E use for LE ADLs.     Vision         Perception     Praxis      Pertinent Vitals/Pain Pain Assessment: 0-10 Pain Score: 0-No pain Pain Location: Left Knee Pain Descriptors / Indicators: Sore Pain Intervention(s): Limited activity within patient's tolerance;Monitored during session;Repositioned     Hand Dominance     Extremity/Trunk Assessment Upper Extremity Assessment Upper Extremity Assessment: Overall WFL for tasks assessed     Communication Communication Communication: HOH   Cognition Arousal/Alertness: Awake/alert Behavior During Therapy: WFL for tasks assessed/performed Overall Cognitive Status: Within Functional Limits for tasks assessed  General Comments  Hemovac and polar care in place    Exercises   Shoulder Instructions      Home Living Family/patient expects to be discharged to:: Private residence Living Arrangements: Alone Available Help at Discharge: Family;Available 24 hours/day Type of Home: House Home Access: Stairs to enter;Ramped entrance Entrance Stairs-Number of Steps: 1 Entrance Stairs-Rails: None Home Layout: Two level;Able to live on main level with bedroom/bathroom     Bathroom Shower/Tub: Occupational psychologist: Handicapped height     Home Equipment: Clinical cytogeneticist - 2 wheels;Walker - 4 wheels;Bedside commode;Shower seat - built in;Grab bars -  tub/shower;Hand held shower head          Prior Functioning/Environment Level of Independence: Independent with assistive device(s);Needs assistance    ADL's / Homemaking Assistance Needed: Independent with ADLs, IADLs, meal preparation, medication management, and driving.   Comments: Pt ambulatory with SPC; pt reports no h/o falls in past 6 months.        OT Problem List: Decreased strength;Pain;Decreased range of motion      OT Treatment/Interventions:      OT Goals(Current goals can be found in the care plan section) Acute Rehab OT Goals Patient Stated Goal: To return home OT Goal Formulation: With patient Potential to Achieve Goals: Good  OT Frequency:     Barriers to D/C:            Co-evaluation              AM-PAC PT "6 Clicks" Daily Activity     Outcome Measure Help from another person eating meals?: None Help from another person taking care of personal grooming?: None Help from another person toileting, which includes using toliet, bedpan, or urinal?: A Little Help from another person bathing (including washing, rinsing, drying)?: A Little Help from another person to put on and taking off regular upper body clothing?: None Help from another person to put on and taking off regular lower body clothing?: A Little 6 Click Score: 21   End of Session    Activity Tolerance: Patient tolerated treatment well Patient left: in chair;with chair alarm set  OT Visit Diagnosis: Muscle weakness (generalized) (M62.81)                Time: 4239-5320 OT Time Calculation (min): 15 min Charges:  OT General Charges $OT Visit: 1 Visit OT Evaluation $OT Eval Low Complexity: 1 Low  Harrel Carina, MS, OTR/L   Harrel Carina 07/05/2018, 10:20 AM

## 2018-07-05 NOTE — Evaluation (Signed)
Physical Therapy Evaluation Patient Details Name: Aimee Gutierrez MRN: [id number] DOB: [birth year] Today's Date: 07/05/2018   History of Present Illness  Pt is an 84 y.o. female s/p L TKA secondary degenerative arthrosis 07/04/18.  PMH includes R TKR 2017; L breast CA s/p mastectomy 03/26/18, hernia repair ventral 02/01/18, PVD, IBS, B breast lumpectomy.  Clinical Impression  Prior to hospital admission, pt was modified independent ambulating with SPC.  Pt lives alone on main level of home with 1 step to enter (no railing).  Pt reports her daughter is in town and planning on staying with pt for a few weeks to assist pt as needed.  Pt able to perform L LE SLR x10 reps independently (no knee immobilizer needed).  Pain L knee 1/10 beginning of session, 4/10 with activity, and 0/10 end of session (pt received pain medications beginning of session).  Currently pt is CGA supine to sit; CGA with transfers, and CGA ambulating 40 feet with RW.  Overall pt steady with functional mobility.  Pt would benefit from skilled PT to address noted impairments and functional limitations (see below for any additional details).  Upon hospital discharge, recommend pt discharge to home with HHPT and support of family.    Follow Up Recommendations Home health PT    Equipment Recommendations  Rolling walker with 5" wheels    Recommendations for Other Services       Precautions / Restrictions Precautions Precautions: Fall;Knee Precaution Booklet Issued: Yes (comment) Required Braces or Orthoses: Knee Immobilizer - Left Knee Immobilizer - Left: Discontinue once straight leg raise with < 10 degree lag Restrictions Weight Bearing Restrictions: Yes LLE Weight Bearing: Weight bearing as tolerated      Mobility  Bed Mobility Overal bed mobility: Needs Assistance Bed Mobility: Supine to Sit     Supine to sit: Min guard;HOB elevated     General bed mobility comments: mild increased effort to perform on own; CGA of L LE  for safety  Transfers Overall transfer level: Needs assistance Equipment used: Rolling walker (2 wheeled) Transfers: Sit to/from Stand Sit to Stand: Min guard         General transfer comment: vc's required for UE and LE positioning and technique for transfers  Ambulation/Gait Ambulation/Gait assistance: Min guard Gait Distance (Feet): 40 Feet Assistive device: Rolling walker (2 wheeled)   Gait velocity: decreased   General Gait Details: decreased stance time L LE; vc's to increased UE support through RW to offweight L LE; steady; no knee buckling noted  Stairs            Wheelchair Mobility    Modified Rankin (Stroke Patients Only)       Balance Overall balance assessment: Needs assistance Sitting-balance support: No upper extremity supported;Feet supported Sitting balance-Leahy Scale: Normal Sitting balance - Comments: steady sitting reaching outside BOS   Standing balance support: Single extremity supported Standing balance-Leahy Scale: Poor Standing balance comment: pt requires at least single UE support for static standing balance                             Pertinent Vitals/Pain Pain Assessment: 0-10 Pain Score: 0-No pain(1/10 beginning of session; 4/10 with activity; 0/10 end of session) Pain Location: L knee Pain Descriptors / Indicators: Sore Pain Intervention(s): Limited activity within patient's tolerance;Monitored during session;Repositioned;RN gave pain meds during session;Other (comment)(Polar care applied)  Vitals (HR and O2 on room air) stable and WFL throughout treatment session.  Home Living Family/patient expects to be discharged to:: Private residence Living Arrangements: Alone Available Help at Discharge: Family;Available 24 hours/day Type of Home: House Home Access: Stairs to enter Entrance Stairs-Rails: None Entrance Stairs-Number of Steps: 1 Home Layout: Two level;Able to live on main level with  bedroom/bathroom Home Equipment: Shower seat;Walker - 2 wheels;Walker - 4 wheels;Bedside commode;Shower seat - built in;Grab bars - tub/shower(RW pt's husbands)      Prior Function Level of Independence: Independent with assistive device(s)         Comments: Pt ambulatory with SPC; pt reports no h/o falls in past 6 months.     Hand Dominance        Extremity/Trunk Assessment   Upper Extremity Assessment Upper Extremity Assessment: Overall WFL for tasks assessed    Lower Extremity Assessment Lower Extremity Assessment: RLE deficits/detail;LLE deficits/detail RLE Deficits / Details: strength and ROM WFL LLE Deficits / Details: able to perform SLR independently; at least 3/5 AROM knee flexion/extension (available range); at least 3/5 DF    Cervical / Trunk Assessment Cervical / Trunk Assessment: Normal  Communication   Communication: HOH(Wears hearing aides)  Cognition Arousal/Alertness: Awake/alert Behavior During Therapy: WFL for tasks assessed/performed Overall Cognitive Status: Within Functional Limits for tasks assessed                                        General Comments General comments (skin integrity, edema, etc.): Hemovac and polar care in place.  Nursing cleared pt for participation in physical therapy.  Pt agreeable to PT session.  Pt's daughter present during session.     Exercises Total Joint Exercises Ankle Circles/Pumps: AROM;Strengthening;Both;10 reps;Supine Quad Sets: AROM;Strengthening;Both;10 reps;Supine Short Arc Quad: AROM;Strengthening;Left;10 reps;Supine Heel Slides: AAROM;Strengthening;Left;10 reps;Supine Hip ABduction/ADduction: AROM;Strengthening;Left;10 reps;Supine Straight Leg Raises: AROM;Strengthening;Left;10 reps;Supine Goniometric ROM: L knee extension 0 degrees semi-supine in bed; L knee flexion AROM 80 degrees sitting edge of chair   Assessment/Plan    PT Assessment Patient needs continued PT services  PT  Problem List Decreased strength;Decreased range of motion;Decreased activity tolerance;Decreased balance;Decreased mobility;Decreased knowledge of use of DME;Decreased knowledge of precautions;Pain       PT Treatment Interventions DME instruction;Gait training;Stair training;Functional mobility training;Therapeutic activities;Therapeutic exercise;Balance training;Patient/family education    PT Goals (Current goals can be found in the Care Plan section)  Acute Rehab PT Goals Patient Stated Goal: to go home PT Goal Formulation: With patient Time For Goal Achievement: 07/19/18 Potential to Achieve Goals: Good    Frequency BID   Barriers to discharge        Co-evaluation               AM-PAC PT "6 Clicks" Daily Activity  Outcome Measure Difficulty turning over in bed (including adjusting bedclothes, sheets and blankets)?: A Little Difficulty moving from lying on back to sitting on the side of the bed? : Unable Difficulty sitting down on and standing up from a chair with arms (e.g., wheelchair, bedside commode, etc,.)?: Unable Help needed moving to and from a bed to chair (including a wheelchair)?: A Little Help needed walking in hospital room?: A Little Help needed climbing 3-5 steps with a railing? : A Little 6 Click Score: 14    End of Session Equipment Utilized During Treatment: Gait belt Activity Tolerance: Patient tolerated treatment well Patient left: in chair;with call bell/phone within reach;with chair alarm set;with family/visitor present;with SCD's reapplied;Other (comment)(B heels elevated via  towel rolls; polar care in place and activated) Nurse Communication: Mobility status;Precautions;Weight bearing status PT Visit Diagnosis: Other abnormalities of gait and mobility (R26.89);Muscle weakness (generalized) (M62.81);Difficulty in walking, not elsewhere classified (R26.2);Pain Pain - Right/Left: Left Pain - part of body: Knee    Time: 5003-7048 PT Time  Calculation (min) (ACUTE ONLY): 50 min   Charges:   PT Evaluation $PT Eval Low Complexity: 1 Low PT Treatments $Therapeutic Exercise: 8-22 mins $Therapeutic Activity: 8-22 mins        Leitha Bleak, PT 07/05/18, 9:02 AM [phone]

## 2018-07-05 NOTE — NC FL2 (Signed)
[place] LEVEL OF CARE SCREENING TOOL     IDENTIFICATION  Patient Name: Aimee Gutierrez Birthdate: 1933/05/30 Sex: female Admission Date (Current Location): 07/04/2018  [place] and [place] Number:  Engineering geologist and Address:  [facility], [address], [place], [place] [address]      Provider Number: [id number]  Attending Physician Name and Address:  Dereck Leep, MD  Relative Name and Phone Number:       Current Level of Care: Hospital Recommended Level of Care: [place] Prior Approval Number:    Date Approved/Denied:   PASRR Number: [id number])  Discharge Plan: SNF    Current Diagnoses: Patient Active Problem List   Diagnosis Date Noted  . Seroma of breast 05/11/2018  . Left breast mass 03/15/2018  . Goals of care, counseling/discussion 03/15/2018  . Malignant neoplasm of upper-outer quadrant of left female breast ([place]) 03/15/2018  . History of breast cancer 10/13/2017  . Primary osteoarthritis of left knee 05/28/2017  . S/P total knee arthroplasty 10/12/2016  . Chronic ITP (idiopathic thrombocytopenic purpura) (HCC) 10/06/2016  . Chronic gouty arthritis 10/04/2016  . Aortic atherosclerosis ([place]) 08/26/2016  . Pure hypercholesterolemia 12/03/2015  . Carcinoma in situ, breast, ductal 08/03/2015  . Acid indigestion 07/02/2015  . Adult hypothyroidism 07/02/2015  . Arthritis, degenerative 07/02/2015  . Change in blood platelet count 07/02/2015  . Epigastric pain 07/02/2015  . Osteoporosis 06/04/2015  . Age-related osteoporosis without current pathological fracture 06/04/2015  . Invasive ductal carcinoma of right breast, stage 1 ([place]) 03/13/2015  . Invasive ductal carcinoma of left breast, stage 1 (HCC) 02/26/2015    Orientation RESPIRATION BLADDER Height & Weight     Self, Situation, Place  Normal Continent Weight: 65.8 kg (145 lb) Height:  5\' 6"  (167.6 cm)  BEHAVIORAL SYMPTOMS/MOOD  NEUROLOGICAL BOWEL NUTRITION STATUS      Continent Diet(Diet: Regular )  AMBULATORY STATUS COMMUNICATION OF NEEDS Skin   Extensive Assist Verbally Surgical wounds(Incision: Left Knee. )                       Personal Care Assistance Level of Assistance  Bathing, Feeding, Dressing Bathing Assistance: Limited assistance Feeding assistance: Independent Dressing Assistance: Limited assistance     Functional Limitations Info  Sight, Hearing, Speech Sight Info: Adequate Hearing Info: Adequate Speech Info: Adequate    SPECIAL CARE FACTORS FREQUENCY  PT (By licensed PT), OT (By licensed OT)     PT Frequency: (5) OT Frequency: (5)            Contractures      Additional Factors Info  Code Status, Allergies Code Status Info: (Full Code. ) Allergies Info: (Nitrofurantoin, Latex, Levothyroxine, Sulfa Antibiotics)           Current Medications (07/05/2018):  This is the current hospital active medication list Current Facility-Administered Medications  Medication Dose Route Frequency Provider Last Rate Last Dose  . 0.9 %  sodium chloride infusion   Intravenous Continuous Dereck Leep, MD 100 mL/hr at 07/04/18 2027    . acetaminophen (OFIRMEV) IV 1,000 mg  1,000 mg Intravenous Q6H Hooten, Laurice Record, MD 400 mL/hr at 07/05/18 0526 1,000 mg at 07/05/18 0526  . acetaminophen (TYLENOL) tablet 325-650 mg  325-650 mg Oral Q6H PRN Hooten, Laurice Record, MD      . acidophilus (RISAQUAD) capsule 1 capsule  1 capsule Oral QODAY Hooten, Laurice Record, MD      . alum & Iris Pert  hydroxide-simeth (MAALOX/MYLANTA) 200-200-20 MG/5ML suspension 30 mL  30 mL Oral Q4H PRN Hooten, Laurice Record, MD      . bisacodyl (DULCOLAX) suppository 10 mg  10 mg Rectal Daily PRN Hooten, Laurice Record, MD      . calcium carbonate (TUMS - dosed in mg elemental calcium) chewable tablet 600 mg of elemental calcium  3 tablet Oral QHS Hooten, Laurice Record, MD   600 mg of elemental calcium at 07/04/18 2111  . ceFAZolin (ANCEF) IVPB 2g/100 mL premix   2 g Intravenous Q6H Hooten, Laurice Record, MD 200 mL/hr at 07/05/18 0231 2 g at 07/05/18 0231  . celecoxib (CELEBREX) capsule 200 mg  200 mg Oral BID Dereck Leep, MD   200 mg at 07/05/18 1660  . cholecalciferol (VITAMIN D) tablet 4,000 Units  4,000 Units Oral QHS Dereck Leep, MD   4,000 Units at 07/04/18 2027  . diphenhydrAMINE (BENADRYL) 12.5 MG/5ML elixir 12.5-25 mg  12.5-25 mg Oral Q4H PRN Hooten, Laurice Record, MD      . enoxaparin (LOVENOX) injection 30 mg  30 mg Subcutaneous Q12H Hooten, Laurice Record, MD   30 mg at 07/05/18 6301  . ferrous sulfate tablet 325 mg  325 mg Oral BID WC Dereck Leep, MD   325 mg at 07/05/18 6010  . gabapentin (NEURONTIN) capsule 300 mg  300 mg Oral QHS Hooten, Laurice Record, MD   300 mg at 07/04/18 2028  . HYDROmorphone (DILAUDID) injection 0.5-1 mg  0.5-1 mg Intravenous Q4H PRN Hooten, Laurice Record, MD      . levothyroxine (SYNTHROID, LEVOTHROID) tablet 88 mcg  88 mcg Oral QAC breakfast Dereck Leep, MD   88 mcg at 07/05/18 9323  . magnesium hydroxide (MILK OF MAGNESIA) suspension 30 mL  30 mL Oral Daily Hooten, Laurice Record, MD   30 mL at 07/04/18 2106  . menthol-cetylpyridinium (CEPACOL) lozenge 3 mg  1 lozenge Oral PRN Hooten, Laurice Record, MD       Or  . phenol (CHLORASEPTIC) mouth spray 1 spray  1 spray Mouth/Throat PRN Hooten, Laurice Record, MD      . metoCLOPramide (REGLAN) tablet 5-10 mg  5-10 mg Oral Q8H PRN Hooten, Laurice Record, MD   10 mg at 07/04/18 2029   Or  . metoCLOPramide (REGLAN) injection 5-10 mg  5-10 mg Intravenous Q8H PRN Hooten, Laurice Record, MD      . metoCLOPramide (REGLAN) tablet 10 mg  10 mg Oral TID AC & HS Hooten, Laurice Record, MD   10 mg at 07/05/18 5573  . ondansetron (ZOFRAN) tablet 4 mg  4 mg Oral Q6H PRN Hooten, Laurice Record, MD       Or  . ondansetron (ZOFRAN) injection 4 mg  4 mg Intravenous Q6H PRN Hooten, Laurice Record, MD      . oxyCODONE (Oxy IR/ROXICODONE) immediate release tablet 10 mg  10 mg Oral Q4H PRN Hooten, Laurice Record, MD      . oxyCODONE (Oxy IR/ROXICODONE) immediate  release tablet 5 mg  5 mg Oral Q4H PRN Hooten, Laurice Record, MD      . pantoprazole (PROTONIX) EC tablet 40 mg  40 mg Oral BID Dereck Leep, MD   40 mg at 07/05/18 2202  . senna-docusate (Senokot-S) tablet 1 tablet  1 tablet Oral BID Dereck Leep, MD   1 tablet at 07/05/18 [phone]  . sodium phosphate (FLEET) 7-19 GM/118ML enema 1 enema  1 enema Rectal Once PRN Hooten, Laurice Record, MD      .  traMADol (ULTRAM) tablet 50-100 mg  50-100 mg Oral Q4H PRN Dereck Leep, MD   50 mg at 07/05/18 0808     Discharge Medications: Please see discharge summary for a list of discharge medications.  Relevant Imaging Results:  Relevant Lab Results:   Additional Information (SSN: [id number])  Adasyn Mcadams, Veronia Beets, LCSW

## 2018-07-05 NOTE — Care Management Note (Signed)
Case Management Note  Patient Details  Name: Aimee Gutierrez MRN: [id number] Date of Birth: 12/22/1932  Subjective/Objective:   Admitted to [facility] post total knee repair. Lives alone. Will be going to daughter's home x 2 weeks. [place] [phone]). Prescriptions are filled at [facility]. Last seen Dr. Caryl Comes in April. Home Health per [facility] in the past. No skilled facility. No home oxygen. Rolling walker, grab bars, and walk in shower in the home. Takes care of all basic activities of daily living herself, drives,. Last fall was a year ago. Good appetite. Daughter will transport                Action/Plan: Physical therapy recommending services in the home. Will update Drue Novel, [facility] representative.    Expected Discharge Date:  07/07/18               Expected Discharge Plan:     In-House Referral:    yes Discharge planning Services   yes  Post Acute Care Choice:   Choice offered to:     DME Arranged:    DME Agency:     HH Arranged:   yes HH Agency:   [facility]  Status of Service:     If discussed at [employer], dates discussed:    Additional Comments:  Shelbie Ammons, RN MSN CCM Care Management [phone] 07/05/2018, 11:19 AM

## 2018-07-05 NOTE — Progress Notes (Signed)
   Subjective: 1 Day Post-Op Procedure(s) (LRB): COMPUTER ASSISTED TOTAL KNEE ARTHROPLASTY (Left) Patient reports pain as 5 on 0-10 scale.   Patient is well, and has had no acute complaints or problems We will start therapy today.  Dangle leg at the side of the bed during the night with nurse assistance Plan is to go Rehab after hospital stay. no nausea and no vomiting Patient denies any chest pains or shortness of breath. Objective: Vital signs in last 24 hours: Temp:  [96.8 F (36 C)-98.5 F (36.9 C)] 97.5 F (36.4 C) (08/01 0344) Pulse Rate:  [68-92] 73 (08/01 0344) Resp:  [16-21] 20 (08/01 0344) BP: (92-141)/(55-85) 101/55 (08/01 0344) SpO2:  [93 %-100 %] 95 % (08/01 0344) Weight:  [65.8 kg (145 lb)] 65.8 kg (145 lb) (07/31 1343) Heels are non tender and elevated off the bed using rolled towels along with bone foam under operative heel  Intake/Output from previous day: 07/31 0701 - 08/01 0700 In: 4988.3 [I.V.:2355; IV Piggyback:2353.3] Out: 1150 [Urine:950; Drains:150; Blood:50] Intake/Output this shift: No intake/output data recorded.  No results for input(s): HGB in the last 72 hours. No results for input(s): WBC, RBC, HCT, PLT in the last 72 hours. No results for input(s): NA, K, CL, CO2, BUN, CREATININE, GLUCOSE, CALCIUM in the last 72 hours. No results for input(s): LABPT, INR in the last 72 hours.  EXAM General - Patient is Alert, Appropriate and Oriented Extremity - Neurologically intact Neurovascular intact Sensation intact distally Intact pulses distally Dorsiflexion/Plantar flexion intact Compartment soft Dressing - dressing C/D/I Motor Function - intact, moving foot and toes well on exam.    Past Medical History:  Diagnosis Date  . Arthritis   . Breast cancer ([place]) 2013   left breast, radiation  . Breast cancer of lower-outer quadrant of right female breast ([place]) 03/09/2015   3.5 mm invasive carcinoma, low-grade DCIS. ER 90%, PR greater than 50%,  HER-2/neu not amplified. Axillary sentinel node and radiation deferred based on age..  . Breast cancer, left ([place]) 03/26/2018   8 mm recurrent invasive mammary carcinoma, sentinel node negative.  ER/PR positive, HER-2/neu not overexpressed.  RpT1b rpN0(sn)   . Environmental allergies   . GERD (gastroesophageal reflux disease)   . Hypothyroidism   . IBS (irritable bowel syndrome)    in the past, diverticulitis in the past.  . Osteoporosis   . Personal history of radiation therapy 2016  . Personal history of radiation therapy 2013    Assessment/Plan: 1 Day Post-Op Procedure(s) (LRB): COMPUTER ASSISTED TOTAL KNEE ARTHROPLASTY (Left) Active Problems:   S/P total knee arthroplasty  Estimated body mass index is 23.4 kg/m as calculated from the following:   Height as of this encounter: _0  (1.676 m).   Weight as of this encounter: 65.8 kg (145 lb). Advance diet Up with therapy D/C IV fluids Plan for discharge tomorrow Discharge to SNF   Labs: None DVT Prophylaxis - Lovenox, Foot Pumps and TED hose Weight-Bearing as tolerated to left leg D/C O2 and Pulse OX and try on Room Air Begin working on bowel movement  Afsa Meany R. [place] 07/05/2018, 7:27 AM

## 2018-07-05 NOTE — Progress Notes (Signed)
Clinical Social Worker (CSW) received SNF consult. PT is recommending home health. RN case manager aware of above. Please reconsult if future social work needs arise. CSW signing off.   Cheyla Duchemin, LCSW [phone] 

## 2018-07-05 NOTE — Progress Notes (Signed)
Patient dangled to the side of the bed. Tolerated well.  

## 2018-07-06 MED ORDER — TRAMADOL HCL 50 MG PO TABS: 50.0000 mg | ORAL_TABLET | Freq: Four times a day (QID) | ORAL | 0 refills | Status: DC | PRN

## 2018-07-06 MED ORDER — OXYCODONE HCL 5 MG PO TABS: 5.0000 mg | ORAL_TABLET | ORAL | 0 refills | Status: DC | PRN

## 2018-07-06 MED ORDER — ENOXAPARIN SODIUM 40 MG/0.4ML ~~LOC~~ SOLN: 40.0000 mg | SUBCUTANEOUS | 0 refills | Status: DC

## 2018-07-06 NOTE — Progress Notes (Signed)
   Subjective: 2 Days Post-Op Procedure(s) (LRB): COMPUTER ASSISTED TOTAL KNEE ARTHROPLASTY (Left) Patient reports pain as mild.   Patient is well, and has had no acute complaints or problems Patient did very well with physical therapy yesterday.  Was able to ambulate 120 feet.  Range of motion 0 to 80 degrees of flexion Plan is to go Home after hospital stay. no nausea and no vomiting Patient denies any chest pains or shortness of breath. Patient denies any problems today. Slept fairly well.  Objective: Vital signs in last 24 hours: Temp:  [97.5 F (36.4 C)-97.8 F (36.6 C)] 97.8 F (36.6 C) (08/02 0743) Pulse Rate:  [66-76] 67 (08/02 0743) Resp:  [15-20] 15 (08/01 2345) BP: (108-117)/(57-72) 108/72 (08/02 0743) SpO2:  [94 %-99 %] 97 % (08/02 0743) well approximated incision Heels are non tender and elevated off the bed using rolled towels Intake/Output from previous day: 08/01 0701 - 08/02 0700 In: 840 [P.O.:840] Out: 145 [Drains:145] Intake/Output this shift: No intake/output data recorded.  No results for input(s): HGB in the last 72 hours. No results for input(s): WBC, RBC, HCT, PLT in the last 72 hours. No results for input(s): NA, K, CL, CO2, BUN, CREATININE, GLUCOSE, CALCIUM in the last 72 hours. No results for input(s): LABPT, INR in the last 72 hours.  EXAM General - Patient is Alert, Appropriate and Oriented Extremity - Neurologically intact Neurovascular intact Sensation intact distally Intact pulses distally Dorsiflexion/Plantar flexion intact No cellulitis present Compartment soft Dressing - scant drainage Motor Function - intact, moving foot and toes well on exam.    Past Medical History:  Diagnosis Date  . Arthritis   . Breast cancer ([place]) 2013   left breast, radiation  . Breast cancer of lower-outer quadrant of right female breast ([place]) 03/09/2015   3.5 mm invasive carcinoma, low-grade DCIS. ER 90%, PR greater than 50%, HER-2/neu not amplified.  Axillary sentinel node and radiation deferred based on age..  . Breast cancer, left ([place]) 03/26/2018   8 mm recurrent invasive mammary carcinoma, sentinel node negative.  ER/PR positive, HER-2/neu not overexpressed.  RpT1b rpN0(sn)   . Environmental allergies   . GERD (gastroesophageal reflux disease)   . Hypothyroidism   . IBS (irritable bowel syndrome)    in the past, diverticulitis in the past.  . Osteoporosis   . Personal history of radiation therapy 2016  . Personal history of radiation therapy 2013    Assessment/Plan: 2 Days Post-Op Procedure(s) (LRB): COMPUTER ASSISTED TOTAL KNEE ARTHROPLASTY (Left) Active Problems:   S/P total knee arthroplasty  Estimated body mass index is 23.4 kg/m as calculated from the following:   Height as of this encounter: '5\' 6"'$  (1.676 m).   Weight as of this encounter: 65.8 kg (145 lb). Up with therapy Discharge home with home health  Labs: None DVT Prophylaxis - Lovenox, Foot Pumps and TED hose Weight-Bearing as tolerated to left leg Nursing notes states that she had a bowel movement however patient states that she has not had one.  Sure patient has a bowel movement prior to being discharged Please wash operative leg, change dressing and apply new dressing if needed. Please give the patient 2 extra honeycomb dressings to take home. TED stockings are to be on both legs. Be sure bone foam goes home with patient  Jillyn Ledger. [facility] PA [place] 07/06/2018, 7:55 AM

## 2018-07-06 NOTE — Anesthesia Postprocedure Evaluation (Signed)
Anesthesia Post Note  Patient: Aimee Gutierrez  Procedure(s) Performed: COMPUTER ASSISTED TOTAL KNEE ARTHROPLASTY (Left Knee)  Patient location during evaluation: Nursing Unit Anesthesia Type: Spinal Level of consciousness: awake and alert Pain management: pain level controlled Vital Signs Assessment: post-procedure vital signs reviewed and stable Respiratory status: spontaneous breathing, respiratory function stable and nonlabored ventilation Cardiovascular status: blood pressure returned to baseline and stable Postop Assessment: no headache, no backache and spinal receding Anesthetic complications: no     Last Vitals:  Vitals:   07/05/18 2345 07/06/18 0743  BP: (!) 108/57 108/72  Pulse: 71 67  Resp: 15   Temp: 36.6 C 36.6 C  SpO2: 94% 97%    Last Pain:  Vitals:   07/06/18 0743  TempSrc: Oral  PainSc:                  Aimee Gutierrez

## 2018-07-06 NOTE — Progress Notes (Signed)
Physical Therapy Treatment Patient Details Name: Aimee Gutierrez MRN: [id number] DOB: August 20, 1933 Today's Date: 07/06/2018    History of Present Illness Pt. is an 84 y.o. female who was admitted to [facility] for a Left TKR secondary to degenerative Arthritis. Pt. PMHx includes: Right TKR in 2017, Left Breast CA s/p mastectomy, hernia repair, PVD, Bilateral breast lumpectomy.    PT Comments    Patient with reports of near syncopal episode this AM (sight of blood vs pain medications?); however, resolved and further improved with progressive mobility attempts.  Vitals stable and WFL throughout session; no orthostasis noted with transition to upright or progressive mobility.  Able to complete all mobility with RW, no greater than cga/close sup; demonstrating all tasks at level safe/appropriate for discharge home. Daughter present in room and very supportive/encouraging throughout session.  Comfortable with current status and functional ability; no further questions/concerns at this time.    Follow Up Recommendations  Home health PT     Equipment Recommendations  Rolling walker with 5" wheels    Recommendations for Other Services       Precautions / Restrictions Precautions Precautions: Fall;Knee Restrictions Weight Bearing Restrictions: Yes LLE Weight Bearing: Weight bearing as tolerated    Mobility  Bed Mobility Overal bed mobility: Modified Independent Bed Mobility: Supine to Sit              Transfers Overall transfer level: Needs assistance Equipment used: Rolling walker (2 wheeled) Transfers: Sit to/from Stand Sit to Stand: Supervision         General transfer comment: good hand/foot placement, good ability to actively use/WB L LE  Ambulation/Gait Ambulation/Gait assistance: Supervision Gait Distance (Feet): 200 Feet Assistive device: Rolling walker (2 wheeled)   Gait velocity: 10' walk time, 10 seconds   General Gait Details: reciprocal stepping pattern with mild  forward trunk flexion; slow, but steady, cadence and overall gait speed.  no buckling or LOB.   Stairs Stairs: Yes Stairs assistance: Min guard Stair Management: With walker Number of Stairs: 1(x2) General stair comments: up/down single platform step x2 with RW, cga; good L knee control/stability; good recall of technique   Wheelchair Mobility    Modified Rankin (Stroke Patients Only)       Balance Overall balance assessment: Needs assistance Sitting-balance support: No upper extremity supported;Feet supported Sitting balance-Leahy Scale: Good     Standing balance support: Bilateral upper extremity supported Standing balance-Leahy Scale: Fair                              Cognition Arousal/Alertness: Awake/alert Behavior During Therapy: WFL for tasks assessed/performed Overall Cognitive Status: Within Functional Limits for tasks assessed                                        Exercises Total Joint Exercises Goniometric ROM: L knee: 0-114 degrees, minimal reports of pain Other Exercises Other Exercises: Seated LE therex, 1x15: ankle pumps, LAQs; standing therex with Rw, 1x10, cga: heel raises, mini squats.  Good tolerance for L LE WBing and ROM in closed-chain position. Other Exercises: Verbally reviewed technique for car transfers and getting in/out of bed (requires small step stool due to height of bed); patient/daughter voiced understanding/agreement of all information.    General Comments        Pertinent Vitals/Pain Pain Assessment: Faces Faces Pain Scale: Hurts a  little bit Pain Location: Left Knee Pain Descriptors / Indicators: Sore Pain Intervention(s): Limited activity within patient's tolerance;Premedicated before session;Monitored during session;Repositioned    Home Living                      Prior Function            PT Goals (current goals can now be found in the care plan section) Acute Rehab PT  Goals Patient Stated Goal: To return home PT Goal Formulation: With patient Time For Goal Achievement: 07/19/18 Potential to Achieve Goals: Good Progress towards PT goals: Progressing toward goals    Frequency    BID      PT Plan Current plan remains appropriate    Co-evaluation              AM-PAC PT "6 Clicks" Daily Activity  Outcome Measure  Difficulty turning over in bed (including adjusting bedclothes, sheets and blankets)?: None Difficulty moving from lying on back to sitting on the side of the bed? : None Difficulty sitting down on and standing up from a chair with arms (e.g., wheelchair, bedside commode, etc,.)?: A Little Help needed moving to and from a bed to chair (including a wheelchair)?: A Little Help needed walking in hospital room?: A Little Help needed climbing 3-5 steps with a railing? : A Little 6 Click Score: 20    End of Session Equipment Utilized During Treatment: Gait belt Activity Tolerance: Patient tolerated treatment well Patient left: in chair;with call bell/phone within reach;with chair alarm set;with family/visitor present Nurse Communication: Mobility status PT Visit Diagnosis: Other abnormalities of gait and mobility (R26.89);Muscle weakness (generalized) (M62.81);Difficulty in walking, not elsewhere classified (R26.2);Pain Pain - Right/Left: Left Pain - part of body: Knee     Time: 0921-1006 PT Time Calculation (min) (ACUTE ONLY): 45 min  Charges:  $Gait Training: 8-22 mins $Therapeutic Exercise: 8-22 mins $Therapeutic Activity: 8-22 mins                     Haddon Fyfe H. Owens Shark, PT, DPT, NCS 07/06/18, 11:44 AM [phone]

## 2018-07-06 NOTE — Discharge Summary (Signed)
Physician Discharge Summary  Patient ID: Aimee Gutierrez MRN: [id number] DOB/AGE: October 02, 1933 84 y.o.  Admit date: 07/04/2018 Discharge date: July 06, 2018  Admission Diagnoses:  primary osteoarthritis of left knee   Discharge Diagnoses: Patient Active Problem List   Diagnosis Date Noted  . Seroma of breast 05/11/2018  . Left breast mass 03/15/2018  . Goals of care, counseling/discussion 03/15/2018  . Malignant neoplasm of upper-outer quadrant of left female breast ([place]) 03/15/2018  . History of breast cancer 10/13/2017  . Primary osteoarthritis of left knee 05/28/2017  . S/P total knee arthroplasty 10/12/2016  . Chronic ITP (idiopathic thrombocytopenic purpura) (HCC) 10/06/2016  . Chronic gouty arthritis 10/04/2016  . Aortic atherosclerosis ([place]) 08/26/2016  . Pure hypercholesterolemia 12/03/2015  . Carcinoma in situ, breast, ductal 08/03/2015  . Acid indigestion 07/02/2015  . Adult hypothyroidism 07/02/2015  . Arthritis, degenerative 07/02/2015  . Change in blood platelet count 07/02/2015  . Epigastric pain 07/02/2015  . Osteoporosis 06/04/2015  . Age-related osteoporosis without current pathological fracture 06/04/2015  . Invasive ductal carcinoma of right breast, stage 1 ([place]) 03/13/2015  . Invasive ductal carcinoma of left breast, stage 1 ([place]) 02/26/2015    Past Medical History:  Diagnosis Date  . Arthritis   . Breast cancer ([place]) 2013   left breast, radiation  . Breast cancer of lower-outer quadrant of right female breast ([place]) 03/09/2015   3.5 mm invasive carcinoma, low-grade DCIS. ER 90%, PR greater than 50%, HER-2/neu not amplified. Axillary sentinel node and radiation deferred based on age..  . Breast cancer, left ([place]) 03/26/2018   8 mm recurrent invasive mammary carcinoma, sentinel node negative.  ER/PR positive, HER-2/neu not overexpressed.  RpT1b rpN0(sn)   . Environmental allergies   . GERD (gastroesophageal reflux disease)   . Hypothyroidism   . IBS  (irritable bowel syndrome)    in the past, diverticulitis in the past.  . Osteoporosis   . Personal history of radiation therapy 2016  . Personal history of radiation therapy 2013     Transfusion: No transfusions during this admission   Consultants (if any):   Discharged Condition: Improved  Hospital Course: Aimee Gutierrez is an 84 y.o. female who was admitted 07/04/2018 with a diagnosis of degenerative arthrosis left knee and went to the operating room on 07/04/2018 and underwent the above named procedures.    Surgeries:Procedure(s): COMPUTER ASSISTED TOTAL KNEE ARTHROPLASTY on 07/04/2018  PRE-OPERATIVE DIAGNOSIS: Degenerative arthrosis of the left knee, primary  POST-OPERATIVE DIAGNOSIS:  Same  PROCEDURE:  Left total knee arthroplasty using computer-assisted navigation  SURGEON:  Marciano Sequin. M.D.  ASSISTANT:  Vance Peper, PA (present and scrubbed throughout the case, critical for assistance with exposure, retraction, instrumentation, and closure)  ANESTHESIA: spinal  ESTIMATED BLOOD LOSS: 50 mL  FLUIDS REPLACED: 1300 mL of crystalloid  TOURNIQUET TIME: 74 minutes  DRAINS: 2 medium Hemovac drains  SOFT TISSUE RELEASES: Anterior cruciate ligament, posterior cruciate ligament, deep medial collateral ligament, patellofemoral ligament  IMPLANTS UTILIZED: DePuy Attune size 5 posterior stabilized femoral component (cemented), size 7 rotating platform tibial component (cemented), 38 mm medialized dome patella (cemented), and a 5 mm stabilized rotating platform polyethylene insert.  INDICATIONS FOR SURGERY: Aimee Gutierrez is a 84 y.o. year old female with a long history of progressive knee pain. X-rays demonstrated severe degenerative changes in tricompartmental fashion. The patient had not seen any significant improvement despite conservative nonsurgical intervention. After discussion of the risks and benefits of surgical intervention, the patient expressed  understanding of  the risks benefits and agree with plans for total knee arthroplasty.   The risks, benefits, and alternatives were discussed at length including but not limited to the risks of infection, bleeding, nerve injury, stiffness, blood clots, the need for revision surgery, cardiopulmonary complications, among others, and they were willing to proceed.   Patient tolerated the surgery well. No complications .Patient was taken to PACU where she was stabilized and then transferred to the orthopedic floor.  Patient started on Lovenox 30 mg q 12 hrs. Foot pumps applied bilaterally at 80 mm hgb. Heels elevated off bed with rolled towels. No evidence of DVT. Calves non tender. Negative Homan. Physical therapy started on day #1 for gait training and transfer with OT starting on  day #1 for ADL and assisted devices. Patient has done well with therapy. Ambulated greater than 200 feet upon being discharged.  Was able to ascend and descend 4 steps safely and independently  Patient's IV And Foley were discontinued on day #1 with Hemovac being discontinued on day #2. Dressing was changed on day 2 prior to patient being discharged   She was given perioperative antibiotics:  Anti-infectives (From admission, onward)   Start     Dose/Rate Route Frequency Ordered Stop   07/04/18 2000  ceFAZolin (ANCEF) IVPB 2g/100 mL premix     2 g 200 mL/hr over 30 Minutes Intravenous Every 6 hours 07/04/18 1946 07/05/18 1959   07/04/18 1333  ceFAZolin (ANCEF) 2-4 GM/100ML-% IVPB    Note to Pharmacy:  Aimee Gutierrez   : cabinet override      07/04/18 1333 07/04/18 2106   07/04/18 0600  ceFAZolin (ANCEF) IVPB 2g/100 mL premix  Status:  Discontinued     2 g 200 mL/hr over 30 Minutes Intravenous On call to O.R. 07/03/18 2153 07/04/18 1337    .  She was fitted with AV 1 compression foot pump devices, instructed on heel pumps, early ambulation, and fitted with TED stockings bilaterally for DVT prophylaxis.  She benefited  maximally from the hospital stay and there were no complications.    Recent vital signs:  Vitals:   07/05/18 2345 07/06/18 0743  BP: (!) 108/57 108/72  Pulse: 71 67  Resp: 15   Temp: 97.8 F (36.6 C) 97.8 F (36.6 C)  SpO2: 94% 97%    Recent laboratory studies:  Lab Results  Component Value Date   HGB 13.8 06/21/2018   HGB 13.9 05/15/2018   HGB 13.1 04/12/2018   Lab Results  Component Value Date   WBC 4.2 06/21/2018   PLT 129 (L) 06/21/2018   Lab Results  Component Value Date   INR 0.94 06/21/2018   Lab Results  Component Value Date   NA 141 06/21/2018   K 4.1 06/21/2018   CL 105 06/21/2018   CO2 30 06/21/2018   BUN 15 06/21/2018   CREATININE 0.73 06/21/2018   GLUCOSE 107 (H) 06/21/2018    Discharge Medications:   Allergies as of 07/06/2018      Reactions   Nitrofurantoin Nausea Only   Latex Rash   Latex IgE was NEGATIVE (<0.10)   Levothyroxine Nausea Only   Shaking, nausea, hypertension-can tolerate Synthroid   Sulfa Antibiotics Rash      Medication List    TAKE these medications   acetaminophen 500 MG tablet Commonly known as:  TYLENOL Take 500 mg by mouth 3 (three) times daily as needed for moderate pain.   calcium carbonate 750 MG chewable tablet Commonly known as:  Nurse, adult  EX Chew 2 tablets by mouth at bedtime.   enoxaparin 40 MG/0.4ML injection Commonly known as:  LOVENOX Inject 0.4 mLs (40 mg total) into the skin daily for 14 days.   FLORAJEN ACIDOPHILUS Caps Take 1 capsule by mouth every other day. At night   omeprazole 20 MG capsule Commonly known as:  PRILOSEC Take 20 mg by mouth daily before breakfast.   OVER THE COUNTER MEDICATION Place 1 application into both ears daily. EARGENE Soothing Ear Lotion--applied prior to placing hearing aids   oxyCODONE 5 MG immediate release tablet Commonly known as:  Oxy IR/ROXICODONE Take 1 tablet (5 mg total) by mouth every 4 (four) hours as needed for moderate pain (pain score 4-6).   SYNTHROID  88 MCG tablet Generic drug:  levothyroxine Take 88 mcg by mouth daily.   traMADol 50 MG tablet Commonly known as:  ULTRAM Take 1-2 tablets (50-100 mg total) by mouth every 6 (six) hours as needed for moderate pain.   VICKS VAPORUB EX Apply 1 application topically daily as needed (applied to legs).   Vitamin D 2000 units tablet Take 4,000 Units by mouth at bedtime.            Durable Medical Equipment  (From admission, onward)        Start     Ordered   07/04/18 1947  DME Walker rolling  Once    Question:  Patient needs a walker to treat with the following condition  Answer:  Total knee replacement status   07/04/18 1946   07/04/18 1947  DME Bedside commode  Once    Question:  Patient needs a bedside commode to treat with the following condition  Answer:  Total knee replacement status   07/04/18 1946      Diagnostic Studies: Dg Knee Left Port  Result Date: 07/04/2018 CLINICAL DATA:  Postop EXAM: PORTABLE LEFT KNEE - 1-2 VIEW COMPARISON:  None. FINDINGS: Cutaneous staples. Surgical drain in the suprapatellar soft tissues. Status post left knee replacement with normal alignment. No fracture. Gas in the soft tissues consistent with recent operative status. IMPRESSION: Status post left knee replacement with expected postsurgical changes. Electronically Signed   By: Donavan Foil M.D.   On: 07/04/2018 19:00   Dg Foot Complete Left  Result Date: 06/20/2018 Please see detailed radiograph report in office note.   Disposition: Discharge disposition: 01-Home or Self Care       Discharge Instructions    Increase activity slowly   Complete by:  As directed       Follow-up Information    Watt Climes, PA On 07/19/2018.   Specialty:  Physician Assistant Why:  at 9:45am Contact information: [place] [address] [phone]        Dereck Leep, MD On 08/30/2018.   Specialty:  Orthopedic Surgery Why:  at  11:30am Contact information: [place]  [address] [phone]            Signed: Watt Climes 07/06/2018, 8:03 AM  07/06/2018

## 2018-07-06 NOTE — Care Management Important Message (Signed)
Important Message  Patient Details  Name: Aimee Gutierrez MRN: [id number] Date of Birth: [birth year]   Medicare Important Message Given:  Yes    Juliann Pulse A Stryker Veasey 07/06/2018, 10:31 AM

## 2018-07-06 NOTE — Discharge Planning (Signed)
Patient IV removed.  RN assessment and VS revealed stability for DC to home with [facility]. Discharge papers given, explained and educated.  Informed of suggested FU appts and appts made.  Paper pain scripts signed and given.  Discussed self and incision care at home.  Once ready, will be wheeled to front and family transporting home via car.

## 2018-07-12 ENCOUNTER — Ambulatory Visit (INDEPENDENT_AMBULATORY_CARE_PROVIDER_SITE_OTHER): Payer: Medicare Other

## 2018-07-12 ENCOUNTER — Ambulatory Visit: Payer: Medicare Other | Admitting: General Surgery

## 2018-07-12 ENCOUNTER — Encounter: Payer: Self-pay | Admitting: General Surgery

## 2018-07-12 VITALS — BP 120/60 | HR 68 | Resp 12 | Ht 67.0 in | Wt 145.0 lb

## 2018-07-12 DIAGNOSIS — Z170 Estrogen receptor positive status [ER+]: Secondary | ICD-10-CM

## 2018-07-12 DIAGNOSIS — C50412 Malignant neoplasm of upper-outer quadrant of left female breast: Secondary | ICD-10-CM

## 2018-07-12 DIAGNOSIS — N6489 Other specified disorders of breast: Secondary | ICD-10-CM

## 2018-07-12 NOTE — Progress Notes (Addendum)
Patient ID: Aimee Gutierrez, female   DOB: July 11, 1933, 84 y.o.   MRN: [id number]  Chief Complaint  Patient presents with  . Other    HPI Aimee Gutierrez is a 84 y.o. female here today for left mastectomy site feels like there is "fluid". She has had breast pain for 3 days. Left knee surgery was on 07/04/2018.   HPI  Past Medical History:  Diagnosis Date  . Arthritis   . Breast cancer ([place]) 2013   left breast, radiation  . Breast cancer of lower-outer quadrant of right female breast ([place]) 03/09/2015   3.5 mm invasive carcinoma, low-grade DCIS. ER 90%, PR greater than 50%, HER-2/neu not amplified. Axillary sentinel node and radiation deferred based on age..  . Breast cancer, left ([place]) 03/26/2018   8 mm recurrent invasive mammary carcinoma, sentinel node negative.  ER/PR positive, HER-2/neu not overexpressed.  RpT1b rpN0(sn)   . Environmental allergies   . GERD (gastroesophageal reflux disease)   . Hypothyroidism   . IBS (irritable bowel syndrome)    in the past, diverticulitis in the past.  . Osteoporosis   . Personal history of radiation therapy 2016  . Personal history of radiation therapy 2013    Past Surgical History:  Procedure Laterality Date  . ABDOMINAL HYSTERECTOMY     1969  . BREAST BIOPSY Right 02/17/15   positive  . BREAST BIOPSY Left 03/13/2018   [place] core bx, INVASIVE MAMMARY CARCINOMA  . BREAST LUMPECTOMY Right 2016  . BREAST LUMPECTOMY Left 2013  . BREAST SURGERY Left 2013   [place] Right 03/09/15   wide excision  . CATARACT EXTRACTION  2013, 2015  . JOINT REPLACEMENT     right knee  . KNEE ARTHROPLASTY Left 07/04/2018   Procedure: COMPUTER ASSISTED TOTAL KNEE ARTHROPLASTY;  Surgeon: Dereck Leep, MD;  Location: [facility] ORS;  Service: Orthopedics;  Laterality: Left;  . OVARY SURGERY  2001   benign  . SENTINEL NODE BIOPSY Left 03/26/2018   Procedure: SENTINEL NODE BIOPSY;  Surgeon: Robert Bellow, MD;  Location: [facility] ORS;  Service:  General;  Laterality: Left;  . SIMPLE MASTECTOMY WITH AXILLARY SENTINEL NODE BIOPSY Left 03/26/2018   Procedure: SIMPLE MASTECTOMY;  Surgeon: Robert Bellow, MD;  Location: [facility] ORS;  Service: General;  Laterality: Left;  . TOTAL KNEE ARTHROPLASTY Right 10/12/2016   Procedure: TOTAL KNEE ARTHROPLASTY;  Surgeon: Dereck Leep, MD;  Location: [facility] ORS;  Service: Orthopedics;  Laterality: Right;  . VENTRAL HERNIA REPAIR N/A 02/01/2018   Procedure: HERNIA REPAIR VENTRAL ADULT;  Surgeon: Herbert Pun, MD;  Location: [facility] ORS;  Service: General;  Laterality: N/A;    Family History  Problem Relation Age of Onset  . Arthritis Mother   . Heart disease Father   . Alzheimer's disease Sister   . Breast cancer Paternal Grandmother     Social History Social History   Tobacco Use  . Smoking status: Former Smoker    Last attempt to quit: 12/05/2009    Years since quitting: 8.6  . Smokeless tobacco: Never Used  Substance Use Topics  . Alcohol use: No    Alcohol/week: 0.0 standard drinks    Comment: rare glass of wine  . Drug use: No    Allergies  Allergen Reactions  . Nitrofurantoin Nausea Only  . Latex Rash    Latex IgE was NEGATIVE (<0.10)  . Levothyroxine Nausea Only    Shaking, nausea, hypertension-can tolerate Synthroid  . Sulfa Antibiotics  Rash    Current Outpatient Medications  Medication Sig Dispense Refill  . acetaminophen (TYLENOL) 500 MG tablet Take 500 mg by mouth 3 (three) times daily as needed for moderate pain.     . calcium carbonate (TUMS EX) 750 MG chewable tablet Chew 2 tablets by mouth at bedtime.     . Camphor-Eucalyptus-Menthol (VICKS VAPORUB EX) Apply 1 application topically daily as needed (applied to legs).    . Cholecalciferol (VITAMIN D) 2000 units tablet Take 4,000 Units by mouth at bedtime.     . enoxaparin (LOVENOX) 40 MG/0.4ML injection Inject 0.4 mLs (40 mg total) into the skin daily for 14 days. 14 Syringe 0  . Lactobacillus (FLORAJEN  ACIDOPHILUS) CAPS Take 1 capsule by mouth every other day. At night    . omeprazole (PRILOSEC) 20 MG capsule Take 20 mg by mouth daily before breakfast.    . OVER THE COUNTER MEDICATION Place 1 application into both ears daily. EARGENE Soothing Ear Lotion--applied prior to placing hearing aids    . oxyCODONE (OXY IR/ROXICODONE) 5 MG immediate release tablet Take 1 tablet (5 mg total) by mouth every 4 (four) hours as needed for moderate pain (pain score 4-6). 30 tablet 0  . SYNTHROID 88 MCG tablet Take 88 mcg by mouth daily.    . traMADol (ULTRAM) 50 MG tablet Take 1-2 tablets (50-100 mg total) by mouth every 6 (six) hours as needed for moderate pain. 60 tablet 0   No current facility-administered medications for this visit.     Review of Systems Review of Systems  Constitutional: Negative.   Respiratory: Negative.   Cardiovascular: Negative.     Blood pressure 120/60, pulse 68, resp. rate 12, height 5' 7" (1.702 m), weight 145 lb (65.8 kg).  Physical Exam Physical Exam  Constitutional: She is oriented to person, place, and time. She appears well-developed and well-nourished.  Pulmonary/Chest:  Seroma 50 ml  Neurological: She is alert and oriented to person, place, and time.  Skin: Skin is warm and dry.  Psychiatric: Her behavior is normal.   Ultrasound was used during aspiration to obtain as complete resolution of the seroma is possible.    Assessment    Doing well post left total knee replacement.  Gradual resolution of left mastectomy site seroma.    Plan   Follow up in 1 month   HPI, Physical Exam, Assessment and Plan have been scribed under the direction and in the presence of Robert Bellow, MD. Karie Fetch, RN  I have completed the exam and reviewed the above documentation for accuracy and completeness.  I agree with the above.  Haematologist has been used and any errors in dictation or transcription are unintentional.  Hervey Ard, M.D.,  F.A.C.S.   Forest Gleason Osias Resnick 07/13/2018, 1:34 PM

## 2018-07-12 NOTE — Patient Instructions (Signed)
The patient is aware to call back for any questions or concerns.  

## 2018-07-13 ENCOUNTER — Ambulatory Visit: Payer: Medicare Other

## 2018-07-26 ENCOUNTER — Telehealth: Payer: Self-pay | Admitting: *Deleted

## 2018-07-26 NOTE — Telephone Encounter (Signed)
Sure. We can move her to Monday if she is available. That is not much earlier, but it would be the next available for her. We need to check baseline pre-therapy labs and field any additional questions regarding the use of tamoxifen that she may have.   We can certainly make it happen. Just let us know and I can have scheduling follow up with her regarding getting a new appointment.

## 2018-07-26 NOTE — Telephone Encounter (Signed)
Patient asking if she can go ahead and start the Tamoxifen before she sees physician while her daughter is here caring for her during her surgical recovery. She is willing to com in earlier than 8/29 if need be to get it started sooner. She is still healing from her knee and breast surgeries and getting physical therapy. Please advise

## 2018-07-26 NOTE — Telephone Encounter (Signed)
Patient unable to do the only time slot available Monday is 1015 she has physical therapy at that time so she will just keep her appointment for Thursday

## 2018-08-02 ENCOUNTER — Other Ambulatory Visit: Payer: Self-pay | Admitting: Hematology and Oncology

## 2018-08-02 ENCOUNTER — Encounter: Payer: Self-pay | Admitting: Hematology and Oncology

## 2018-08-02 ENCOUNTER — Inpatient Hospital Stay: Payer: Medicare Other | Attending: Hematology and Oncology

## 2018-08-02 ENCOUNTER — Inpatient Hospital Stay (HOSPITAL_BASED_OUTPATIENT_CLINIC_OR_DEPARTMENT_OTHER): Payer: Medicare Other | Admitting: Hematology and Oncology

## 2018-08-02 VITALS — BP 115/38 | HR 81 | Temp 98.2°F | Resp 18 | Wt 148.4 lb

## 2018-08-02 DIAGNOSIS — Z170 Estrogen receptor positive status [ER+]: Principal | ICD-10-CM

## 2018-08-02 DIAGNOSIS — C50412 Malignant neoplasm of upper-outer quadrant of left female breast: Secondary | ICD-10-CM | POA: Diagnosis present

## 2018-08-02 DIAGNOSIS — Z923 Personal history of irradiation: Secondary | ICD-10-CM | POA: Diagnosis not present

## 2018-08-02 DIAGNOSIS — Z9012 Acquired absence of left breast and nipple: Secondary | ICD-10-CM

## 2018-08-02 DIAGNOSIS — R5383 Other fatigue: Secondary | ICD-10-CM

## 2018-08-02 DIAGNOSIS — K219 Gastro-esophageal reflux disease without esophagitis: Secondary | ICD-10-CM | POA: Insufficient documentation

## 2018-08-02 DIAGNOSIS — Z79899 Other long term (current) drug therapy: Secondary | ICD-10-CM | POA: Diagnosis not present

## 2018-08-02 DIAGNOSIS — E039 Hypothyroidism, unspecified: Secondary | ICD-10-CM

## 2018-08-02 DIAGNOSIS — Z853 Personal history of malignant neoplasm of breast: Secondary | ICD-10-CM

## 2018-08-02 DIAGNOSIS — R5381 Other malaise: Secondary | ICD-10-CM

## 2018-08-02 DIAGNOSIS — C50911 Malignant neoplasm of unspecified site of right female breast: Secondary | ICD-10-CM

## 2018-08-02 DIAGNOSIS — M810 Age-related osteoporosis without current pathological fracture: Secondary | ICD-10-CM

## 2018-08-02 DIAGNOSIS — Z87891 Personal history of nicotine dependence: Secondary | ICD-10-CM

## 2018-08-02 DIAGNOSIS — D693 Immune thrombocytopenic purpura: Secondary | ICD-10-CM | POA: Insufficient documentation

## 2018-08-02 DIAGNOSIS — C50912 Malignant neoplasm of unspecified site of left female breast: Secondary | ICD-10-CM

## 2018-08-02 LAB — COMPREHENSIVE METABOLIC PANEL
ALT: 11 U/L (ref 0–44)
AST: 16 U/L (ref 15–41)
Albumin: 4.1 g/dL (ref 3.5–5.0)
Alkaline Phosphatase: 77 U/L (ref 38–126)
Anion gap: 4 — ABNORMAL LOW (ref 5–15)
BUN: 16 mg/dL (ref 8–23)
CO2: 30 mmol/L (ref 22–32)
Calcium: 9.6 mg/dL (ref 8.9–10.3)
Chloride: 102 mmol/L (ref 98–111)
Creatinine, Ser: 0.81 mg/dL (ref 0.44–1.00)
GFR calc Af Amer: >60 mL/min (ref >60)
GFR calc non Af Amer: >60 mL/min (ref >60)
Glucose, Bld: 110 mg/dL — ABNORMAL HIGH (ref 70–99)
Potassium: 4.4 mmol/L (ref 3.5–5.1)
Sodium: 136 mmol/L (ref 135–145)
Total Bilirubin: 0.6 mg/dL (ref 0.3–1.2)
Total Protein: 7.5 g/dL (ref 6.5–8.1)

## 2018-08-02 LAB — CBC WITH DIFFERENTIAL/PLATELET
Basophils Absolute: 0.1 10*3/uL (ref 0–0.1)
Basophils Relative: 2 %
Eosinophils Absolute: 0.2 10*3/uL (ref 0–0.7)
Eosinophils Relative: 4 %
HCT: 34.6 % — ABNORMAL LOW (ref 35.0–47.0)
Hemoglobin: 11.2 g/dL — ABNORMAL LOW (ref 12.0–16.0)
Lymphocytes Relative: 16 %
Lymphs Abs: 0.8 10*3/uL — ABNORMAL LOW (ref 1.0–3.6)
MCH: 29.5 pg (ref 26.0–34.0)
MCHC: 32.3 g/dL (ref 32.0–36.0)
MCV: 91.6 fL (ref 80.0–100.0)
Monocytes Absolute: 0.4 10*3/uL (ref 0.2–0.9)
Monocytes Relative: 8 %
Neutro Abs: 3.5 10*3/uL (ref 1.4–6.5)
Neutrophils Relative %: 70 %
Platelets: 215 10*3/uL (ref 150–440)
RBC: 3.78 MIL/uL — ABNORMAL LOW (ref 3.80–5.20)
RDW: 17.5 % — ABNORMAL HIGH (ref 11.5–14.5)
WBC: 4.9 10*3/uL (ref 3.6–11.0)

## 2018-08-02 NOTE — Progress Notes (Signed)
[facility] day: 08/02/2018   Chief Complaint: Aimee Gutierrez is a 84 y.o. female with a history of bilateral breast cancer who is seen for 1 month assessment and initiation of endocrine therapy.  HPI: The patient was last seen in the medical oncology clinic on 05/15/2018.  At that time, she was healing well s/p left mastectomy.  She had issues with a seroma post-operatively requiring drainage.  She was scheduled for knee surgery in 07/04/2018.  She wished to put off tamoxifen until recovery from surgery  She underwent total left knee arthroplasty on 07/04/2018 by Dr Marry Guan.  Notes reviewed.  She noted a painful knot behind her right knee.  She was felt to have a Baker's cyst or a hematoma.  She has an appointment with dr. Rosalia Hammers for ultrasound today.  During the interim, she is trying to do exercises with her knee as it is 4 weeks post-op.  She will exercise for 45 minutes then ice it.  She states that she is resting 50% of the day.  She does not have a lot of energy.  Regarding her breast, she states that she is not 100% recovered from her mastectomy.  Two weeks ago fluid was removed from her mastectomy site.   Past Medical History:  Diagnosis Date  . Arthritis   . Breast cancer ([place]) 2013   left breast, radiation  . Breast cancer of lower-outer quadrant of right female breast ([place]) 03/09/2015   3.5 mm invasive carcinoma, low-grade DCIS. ER 90%, PR greater than 50%, HER-2/neu not amplified. Axillary sentinel node and radiation deferred based on age..  . Breast cancer, left ([place]) 03/26/2018   8 mm recurrent invasive mammary carcinoma, sentinel node negative.  ER/PR positive, HER-2/neu not overexpressed.  RpT1b rpN0(sn)   . Environmental allergies   . GERD (gastroesophageal reflux disease)   . Hypothyroidism   . IBS (irritable bowel syndrome)    in the past, diverticulitis in the past.  . Osteoporosis   . Personal history of radiation  therapy 2016  . Personal history of radiation therapy 2013    Past Surgical History:  Procedure Laterality Date  . ABDOMINAL HYSTERECTOMY     1969  . BREAST BIOPSY Right 02/17/15   positive  . BREAST BIOPSY Left 03/13/2018   [place] core bx, INVASIVE MAMMARY CARCINOMA  . BREAST LUMPECTOMY Right 2016  . BREAST LUMPECTOMY Left 2013  . BREAST SURGERY Left 2013   [place] Right 03/09/15   wide excision  . CATARACT EXTRACTION  2013, 2015  . JOINT REPLACEMENT     right knee  . KNEE ARTHROPLASTY Left 07/04/2018   Procedure: COMPUTER ASSISTED TOTAL KNEE ARTHROPLASTY;  Surgeon: Dereck Leep, MD;  Location: [facility] ORS;  Service: Orthopedics;  Laterality: Left;  . OVARY SURGERY  2001   benign  . SENTINEL NODE BIOPSY Left 03/26/2018   Procedure: SENTINEL NODE BIOPSY;  Surgeon: Robert Bellow, MD;  Location: [facility];  Service: General;  Laterality: Left;  . SIMPLE MASTECTOMY WITH AXILLARY SENTINEL NODE BIOPSY Left 03/26/2018   Procedure: SIMPLE MASTECTOMY;  Surgeon: Robert Bellow, MD;  Location: [facility];  Service: General;  Laterality: Left;  . TOTAL KNEE ARTHROPLASTY Right 10/12/2016   Procedure: TOTAL KNEE ARTHROPLASTY;  Surgeon: Dereck Leep, MD;  Location: [facility];  Service: Orthopedics;  Laterality: Right;  . VENTRAL HERNIA REPAIR N/A 02/01/2018   Procedure: HERNIA REPAIR VENTRAL ADULT;  Surgeon:  Herbert Pun, MD;  Location: [facility] OR[facility];  Service: General;  Laterality: N/A;    Family History  Problem Relation Age of Onset  . Arthritis Mother   . Heart disease Father   . Alzheimer's disease Sister   . Breast cancer Paternal Grandmother     Social History:  reports that she quit smoking about 8 years ago. She has never used smokeless tobacco. She reports that she does not drink alcohol or use drugs.  He son died on 04-15-16 and her husband died on 29-Apr-2016.  The patient is accompanied by [name], today.    Allergies:  Allergies  Allergen  Reactions  . Nitrofurantoin Nausea Only  . Latex Rash    Latex IgE was NEGATIVE (<0.10)  . Levothyroxine Nausea Only    Shaking, nausea, hypertension-can tolerate Synthroid  . Sulfa Antibiotics Rash    Current Medications: Current Outpatient Medications  Medication Sig Dispense Refill  . acetaminophen (TYLENOL) 500 MG tablet Take 500 mg by mouth 3 (three) times daily as needed for moderate pain.     . calcium carbonate (TUMS EX) 750 MG chewable tablet Chew 2 tablets by mouth at bedtime.     . Camphor-Eucalyptus-Menthol (VICKS VAPORUB EX) Apply 1 application topically daily as needed (applied to legs).    . Cholecalciferol (VITAMIN D) 2000 units tablet Take 4,000 Units by mouth at bedtime.     . Lactobacillus (FLORAJEN ACIDOPHILUS) CAPS Take 1 capsule by mouth every other day. At night    . omeprazole (PRILOSEC) 20 MG capsule Take 20 mg by mouth daily before breakfast.    . OVER THE COUNTER MEDICATION Place 1 application into both ears daily. EARGENE Soothing Ear Lotion--applied prior to placing hearing aids    . SYNTHROID 88 MCG tablet Take 88 mcg by mouth daily.    Marland Kitchen enoxaparin (LOVENOX) 40 MG/0.4ML injection Inject 0.4 mLs (40 mg total) into the skin daily for 14 days. 14 Syringe 0  . oxyCODONE (OXY IR/ROXICODONE) 5 MG immediate release tablet Take 1 tablet (5 mg total) by mouth every 4 (four) hours as needed for moderate pain (pain score 4-6). (Patient not taking: Reported on 08/02/2018) 30 tablet 0  . traMADol (ULTRAM) 50 MG tablet Take 1-2 tablets (50-100 mg total) by mouth every 6 (six) hours as needed for moderate pain. (Patient not taking: Reported on 08/02/2018) 60 tablet 0   No current facility-administered medications for this visit.     Review of Systems  Constitutional: Positive for malaise/fatigue. Negative for diaphoresis, fever and weight loss (up 5 pounds).       Energy level 50%.  Not recovered from knee surgery.  HENT: Negative.  Negative for congestion, ear discharge,  ear pain, nosebleeds, sinus pain and sore throat.   Eyes: Negative.  Negative for blurred vision, double vision, photophobia, pain, discharge and redness.  Respiratory: Negative.  Negative for cough, hemoptysis, sputum production and shortness of breath.   Cardiovascular: Negative.  Negative for chest pain, palpitations, orthopnea, leg swelling and PND.  Gastrointestinal: Negative.  Negative for abdominal pain, blood in stool, constipation, diarrhea, heartburn, melena, nausea and vomiting.  Genitourinary: Negative.  Negative for dysuria, frequency, hematuria and urgency.  Musculoskeletal: Positive for joint pain (arthritis; left knee issues post surgery). Negative for back pain, falls and myalgias.       S/p left knee surgery on 07/04/2018.  Skin: Negative.  Negative for itching and rash.  Neurological: Negative for dizziness, tingling, tremors, sensory change, speech change, focal weakness, weakness (general) and  headaches.  Endo/Heme/Allergies: Does not bruise/bleed easily.       HYPOthryroidism on levothyroxine  Psychiatric/Behavioral: Negative for memory loss and suicidal ideas. The patient is not nervous/anxious and does not have insomnia.   All other systems reviewed and are negative.  Physical Exam: Blood pressure (!) 115/38, pulse 81, temperature 98.2 F (36.8 C), temperature source Tympanic, resp. rate 18, weight 148 lb 6 oz (67.3 kg). GENERAL:  Thin elderly woman sitting comfortably in the exam room in no acute distress.  She has a cane at her side. MENTAL STATUS:  Alert and oriented to person, place and time. HEAD:  Short gray hair.  Normocephalic, atraumatic, face symmetric, no Cushingoid features. EYES:  Hazel eyes.  Pupils equal round and reactive to light and accomodation.  No conjunctivitis or scleral icterus. ENT:  Oropharynx clear without lesion.  Tongue normal. Mucous membranes moist.  RESPIRATORY:  Clear to auscultation without rales, wheezes or rhonchi. CARDIOVASCULAR:   Regular rate and rhythm without murmur, rub or gallop. BREAST:  Left mastectomy with fingertip fullness along incision.   No erythema or induration.  No nodularity.   ABDOMEN:  Soft, non-tender, with active bowel sounds, and no hepatosplenomegaly.  No masses. SKIN:  No rashes, ulcers or lesions. EXTREMITIES: Swelling left knee.  No skin discoloration or tenderness.  No palpable cords. LYMPH NODES: No palpable cervical, supraclavicular, axillary or inguinal adenopathy  NEUROLOGICAL: Unremarkable. PSYCH:  Appropriate.   Tearful at times.   Appointment on 08/02/2018  Component Date Value Ref Range Status  . Sodium 08/02/2018 136  135 - 145 mmol/L Final  . Potassium 08/02/2018 4.4  3.5 - 5.1 mmol/L Final  . Chloride 08/02/2018 102  98 - 111 mmol/L Final  . CO2 08/02/2018 30  22 - 32 mmol/L Final  . Glucose, Bld 08/02/2018 110* 70 - 99 mg/dL Final  . BUN 08/02/2018 16  8 - 23 mg/dL Final  . Creatinine, Ser 08/02/2018 0.81  0.44 - 1.00 mg/dL Final  . Calcium 08/02/2018 9.6  8.9 - 10.3 mg/dL Final  . Total Protein 08/02/2018 7.5  6.5 - 8.1 g/dL Final  . Albumin 08/02/2018 4.1  3.5 - 5.0 g/dL Final  . AST 08/02/2018 16  15 - 41 U/L Final  . ALT 08/02/2018 11  0 - 44 U/L Final  . Alkaline Phosphatase 08/02/2018 77  38 - 126 U/L Final  . Total Bilirubin 08/02/2018 0.6  0.3 - 1.2 mg/dL Final  . GFR calc non Af Amer 08/02/2018 >60  >60 mL/min Final  . GFR calc Af Amer 08/02/2018 >60  >60 mL/min Final   Comment: (NOTE) The eGFR has been calculated using the CKD EPI equation. This calculation has not been validated in all clinical situations. eGFR's persistently <60 mL/min signify possible Chronic Kidney Disease.   Georgiann Hahn gap 08/02/2018 4* 5 - 15 Final   Performed at [facility], [address] [place], [place] [address]  . WBC 08/02/2018 4.9  3.6 - 11.0 K/uL Final  . RBC 08/02/2018 3.78* 3.80 - 5.20 MIL/uL Final  . Hemoglobin 08/02/2018 11.2* 12.0 - 16.0 g/dL Final  . HCT  08/02/2018 34.6* 35.0 - 47.0 % Final  . MCV 08/02/2018 91.6  80.0 - 100.0 fL Final  . MCH 08/02/2018 29.5  26.0 - 34.0 pg Final  . MCHC 08/02/2018 32.3  32.0 - 36.0 g/dL Final  . RDW 08/02/2018 17.5* 11.5 - 14.5 % Final  . Platelets 08/02/2018 215  150 - 440 K/uL Final  .  Neutrophils Relative % 08/02/2018 70  % Final  . Neutro Abs 08/02/2018 3.5  1.4 - 6.5 K/uL Final  . Lymphocytes Relative 08/02/2018 16  % Final  . Lymphs Abs 08/02/2018 0.8* 1.0 - 3.6 K/uL Final  . Monocytes Relative 08/02/2018 8  % Final  . Monocytes Absolute 08/02/2018 0.4  0.2 - 0.9 K/uL Final  . Eosinophils Relative 08/02/2018 4  % Final  . Eosinophils Absolute 08/02/2018 0.2  0 - 0.7 K/uL Final  . Basophils Relative 08/02/2018 2  % Final  . Basophils Absolute 08/02/2018 0.1  0 - 0.1 K/uL Final   Performed at [facility], [address] [place], [place] [address]    Assessment:  Aimee Gutierrez is a 84 y.o. female with a history of bilateral breast cancer and chronic mild thrombocytopenia..   She initially presented with stage I left breast cancer s/p lumpectomy and sentinel lymph node biopsy on 03/09/2012. Initial biopsy revealed a 7 mm lesion in the left breast. Pathology revealed a 1 mm focus of residual invasive mammry carcinoma with DCIS. Stage T1aN0M0. Tumor was ER/PR+. Her2/neu was not assessed.  She received radiation from 04/26/2012 to 06/20/2012. She declined hormonal therapy.   Mammogram on 02/13/2014 revealed no evidence of maligancy. Mammogram and ultrasound on 02/16/2015 revealed a 5 x 3 x 2 mm hypoechoic mass at the 4 o'clock position of the right breast. Core biopsy on 02/17/2015 revealed low grade DCIS, cribiform type. Estrogen receptor was positive (> 90%) and progesterone receptor positive (> 90%).   She underwent wide excision of the right breast on 03/09/2015. Pathology revealed a 3.5 mm focus of grade II invasive carcinoma with ductal carcinoma in situ. Margins were negative.  No lymph nodes were removed. Tumor was ER positive (> 90%), PR positive (51-90%) and Her2/neu negative. Pathologic stage was T1aNxMx. She completed radiation on 05/13/2015.  She decided against hormonal therapy.  Bilateral mammogram on 02/21/2017 revealed stable post lumpectomy changes bilaterally.  Diagnostic mammogram on 03/02/2018 revealing scattered areas of fiber glandular density.  Targeted ultrasound was performed that demonstrated a 6 x 8 x 8 mm irregular hypoechoic LEFT breast mass at the 3 o'clock position, located 3 cm from the nipple.    LEFT breast biopsy on 03/13/2018.  Pathology revealed a grade II invasive mammary carcinoma.  She underwent left simple mastectomy and sentinel lymph node biopsy on 03/26/2018.  Pathology revealed an 8 mm grade II invasive carcinoma of no special type.  DCIS was present.  There was no lymphovascular invasion.  Margins were negative.  Tumor was ER + (> 90%), PR + (1%), and Her2/neu -.  Pathologic stage was rpT1b rpN0 (sn).  CA27.29 has been followed: 24.0 on 04/16/2015, 19.2 on 08/03/2015, 11.8 on 11/10/2015, 14.3 on 04/29/2016, 12.2 on 10/06/2016, 17.7 on 02/23/2017, 13.1 on 05/26/2017, 17 on 10/13/2017, 8.1 on 04/12/2018, and 11.8 on 08/02/2018.  Bone density study on 05/25/2015 revealed osteoporosis (T score of -3.3 in the femur neck and -3.5 in the forearm). Bone density on 05/25/2017 revealed osteoporosis with a T-score of -3.4 in the left femoral neck and -3.4 in the left forearm radius.  She is taking calcium and vitamin D.  She is not interested in Prolia or Fosamax; patient remains adamant that she does not wish to taking anything.   She has persistent thrombocytopenia c/w mild chronic ITP.  Platelet count has ranged between 96,000 - 161,000 without trend.   Abdominal ultrasound on 01/18/2018 showed a 1.1 cm hyperechoic right liver lobe  lesion, felt to be most consistent with a hemangioma.    CT abdomen and pelvis on 01/30/2018 that revealed a  1.2 cm defect within the anterior abdominal wall through which omentum/fat and vessels transverses and appeared inflamed. 1 cm lesion within the posterior right liver lobe again mentioned, and felt to quite possibly a hemangioma.  There was a 1.4 cm lesion at dome of the left lobe of the liver. There was sigmoid diverticulosis with prominent muscular hypertrophy.  Trace free fluid in the right aspect of the pelvis of indeterminate etiology noted.  Small bowel loops that entered the right inguinal canal that did not cause proximal obstruction.  Liver MRI on 02/09/2018 confirmed that the previously demonstrated hepatic lesions were hemangiomas.  There was no evidence of metastatic breast cancer.  There was an enlarging fat-containing ventral hernia that appeared inflamed.  There was questionable incarceration. She underwent ventral hernia repair on 02/01/2018.  She underwent total left knee arthroplasty on 07/04/2018.  Symptomatically, she is struggling s/p knee surgery.  Energy level is 50%.  Exam reveals post operative changes in the left breast.  Plan: 1.  Labs today:  CBC with diff, CMP, CA27.29. 2.  Breast cancer:  Discuss patient's thoughts about initiation of endocrine therapy.  Patient would like to postpone therapy until recovered from knee surgery. 3.  Osteoporosis:  Patient remains uninterested in [place] or a bisphosphonate.  Continue calcium and vitamin D. 4.  RTC in 4 weeks for MD assessment, labs (CBC with diff, CMP), and initiation of tamoxifen.   Nolon Stalls, MD 08/02/2018, 10:27 AM

## 2018-08-02 NOTE — Progress Notes (Signed)
Pt in for follow up, had knee surgery 4 weeks ago, Dr Marry Guan performed surgery.  Pt going back of follow up today, reports "cyst like area on back side of right knee".  Pt reports concern and request MD check "area of Right breast, feels like a hard area:.

## 2018-08-03 LAB — CANCER ANTIGEN 27.29: CA 27.29: 11.8 U/mL (ref 0.0–38.6)

## 2018-08-14 ENCOUNTER — Ambulatory Visit: Payer: Self-pay

## 2018-08-14 ENCOUNTER — Encounter: Payer: Self-pay | Admitting: General Surgery

## 2018-08-14 ENCOUNTER — Ambulatory Visit (INDEPENDENT_AMBULATORY_CARE_PROVIDER_SITE_OTHER): Payer: Medicare Other | Admitting: General Surgery

## 2018-08-14 DIAGNOSIS — N6489 Other specified disorders of breast: Secondary | ICD-10-CM

## 2018-08-14 NOTE — Progress Notes (Addendum)
Patient ID: Aimee Gutierrez, female   DOB: 09/12/1933, 84 y.o.   MRN: [id number]  Chief Complaint  Patient presents with  . Routine Post Op    1 mo f/u seroma s/p left mastectomy done 03-26-18    HPI Aimee Gutierrez is a 84 y.o. female here today for her seroma . Patient states the area has little fluid.   The patient underwent knee replacement on July 04, 2018.  She was recently told she had a hematoma in the posterior aspect of the knee. HPI  Past Medical History:  Diagnosis Date  . Arthritis   . Breast cancer ([place]) 2013   left breast, radiation  . Breast cancer of lower-outer quadrant of right female breast ([place]) 03/09/2015   3.5 mm invasive carcinoma, low-grade DCIS. ER 90%, PR greater than 50%, HER-2/neu not amplified. Axillary sentinel node and radiation deferred based on age..  . Breast cancer, left ([place]) 03/26/2018   8 mm recurrent invasive mammary carcinoma, sentinel node negative.  ER/PR positive, HER-2/neu not overexpressed.  RpT1b rpN0(sn)   . Environmental allergies   . GERD (gastroesophageal reflux disease)   . Hypothyroidism   . IBS (irritable bowel syndrome)    in the past, diverticulitis in the past.  . Osteoporosis   . Personal history of radiation therapy 2016  . Personal history of radiation therapy 2013    Past Surgical History:  Procedure Laterality Date  . ABDOMINAL HYSTERECTOMY     1969  . BREAST BIOPSY Right 02/17/15   positive  . BREAST BIOPSY Left 03/13/2018   [place] core bx, INVASIVE MAMMARY CARCINOMA  . BREAST LUMPECTOMY Right 2016  . BREAST LUMPECTOMY Left 2013  . BREAST SURGERY Left 2013   [place] Right 03/09/15   wide excision  . CATARACT EXTRACTION  2013, 2015  . JOINT REPLACEMENT     right knee  . KNEE ARTHROPLASTY Left 07/04/2018   Procedure: COMPUTER ASSISTED TOTAL KNEE ARTHROPLASTY;  Surgeon: Dereck Leep, MD;  Location: [facility];  Service: Orthopedics;  Laterality: Left;  . OVARY SURGERY  2001   benign  . SENTINEL  NODE BIOPSY Left 03/26/2018   Procedure: SENTINEL NODE BIOPSY;  Surgeon: Robert Bellow, MD;  Location: [facility];  Service: General;  Laterality: Left;  . SIMPLE MASTECTOMY WITH AXILLARY SENTINEL NODE BIOPSY Left 03/26/2018   Procedure: SIMPLE MASTECTOMY;  Surgeon: Robert Bellow, MD;  Location: [facility];  Service: General;  Laterality: Left;  . TOTAL KNEE ARTHROPLASTY Right 10/12/2016   Procedure: TOTAL KNEE ARTHROPLASTY;  Surgeon: Dereck Leep, MD;  Location: [facility];  Service: Orthopedics;  Laterality: Right;  . VENTRAL HERNIA REPAIR N/A 02/01/2018   Procedure: HERNIA REPAIR VENTRAL ADULT;  Surgeon: Herbert Pun, MD;  Location: [facility] OR[facility];  Service: General;  Laterality: N/A;    Family History  Problem Relation Age of Onset  . Arthritis Mother   . Heart disease Father   . Alzheimer's disease Sister   . Breast cancer Paternal Grandmother     Social History Social History   Tobacco Use  . Smoking status: Former Smoker    Last attempt to quit: 12/05/2009    Years since quitting: 8.7  . Smokeless tobacco: Never Used  Substance Use Topics  . Alcohol use: No    Alcohol/week: 0.0 standard drinks    Comment: rare glass of wine  . Drug use: No    Allergies  Allergen Reactions  . Nitrofurantoin Nausea Only  .  Latex Rash    Latex IgE was NEGATIVE (<0.10)  . Levothyroxine Nausea Only    Shaking, nausea, hypertension-can tolerate Synthroid  . Sulfa Antibiotics Rash    Current Outpatient Medications  Medication Sig Dispense Refill  . acetaminophen (TYLENOL) 500 MG tablet Take 500 mg by mouth 3 (three) times daily as needed for moderate pain.     . calcium carbonate (TUMS EX) 750 MG chewable tablet Chew 2 tablets by mouth at bedtime.     . Camphor-Eucalyptus-Menthol (VICKS VAPORUB EX) Apply 1 application topically daily as needed (applied to legs).    . Cholecalciferol (VITAMIN D) 2000 units tablet Take 4,000 Units by mouth at bedtime.     . Lactobacillus (FLORAJEN  ACIDOPHILUS) CAPS Take 1 capsule by mouth every other day. At night    . omeprazole (PRILOSEC) 20 MG capsule Take 20 mg by mouth daily before breakfast.    . OVER THE COUNTER MEDICATION Place 1 application into both ears daily. EARGENE Soothing Ear Lotion--applied prior to placing hearing aids    . SYNTHROID 88 MCG tablet Take 88 mcg by mouth daily.    Marland Kitchen enoxaparin (LOVENOX) 40 MG/0.4ML injection Inject 0.4 mLs (40 mg total) into the skin daily for 14 days. 14 Syringe 0  . oxyCODONE (OXY IR/ROXICODONE) 5 MG immediate release tablet Take 1 tablet (5 mg total) by mouth every 4 (four) hours as needed for moderate pain (pain score 4-6). (Patient not taking: Reported on 08/14/2018) 30 tablet 0  . traMADol (ULTRAM) 50 MG tablet Take 1-2 tablets (50-100 mg total) by mouth every 6 (six) hours as needed for moderate pain. (Patient not taking: Reported on 08/14/2018) 60 tablet 0   No current facility-administered medications for this visit.     Review of Systems Review of Systems  Constitutional: Negative.   Respiratory: Negative.   Cardiovascular: Negative.     Blood pressure 132/74, pulse 84, resp. rate 15, height '5\' 7"'$  (1.702 m), weight 145 lb 12.8 oz (66.1 kg).  Physical Exam Physical Exam  Constitutional: She is oriented to person, place, and time. She appears well-developed and well-nourished.  Pulmonary/Chest:    Neurological: She is alert and oriented to person, place, and time.  Skin: Skin is warm and dry.  Drained 30 ml.  From the left mastectomy site. The left total knee incision is well-healed.  No induration or erythema.  Palpation of the popliteal space showed no discernible mass or thickening.  No palpable cord.  Data Reviewed Ultrasound examination of the lower inner quadrant of the right breast along the scar was completed.  This showed a focal complex heterogeneous area measuring 0.8 x 1.15 x 1.27 cm with the appearance of fat necrosis.  This corresponds to the most recent  mammogram.  BI-RADS-2.  March 02, 2018 bilateral diagnostic mammograms were reviewed.  Right breast showed postsurgical change.  February 29,, 2019 CT of the abdomen and pelvis reviewed.  The area of clinical concern in the breast is not visualized.  Assessment    Slowly resolving seroma of the left mastectomy site.  Fat necrosis involving the lower inner aspect of the right breast.  Clinically negative exam of the left total knee site.    Plan Patient to return in 2 months. The patient is aware to call back for any questions or concerns.  HPI, Physical Exam, Assessment and Plan have been scribed under the direction and in the presence of Hervey Ard, MD.  Gaspar Cola, CMA   I have completed the  exam and reviewed the above documentation for accuracy and completeness.  I agree with the above.  Haematologist has been used and any errors in dictation or transcription are unintentional.  Hervey Ard, M.D., F.A.C.S.  Forest Gleason Mariely Mahr 08/16/2018, 6:16 PM

## 2018-08-14 NOTE — Patient Instructions (Addendum)
  Patient to return in 2 months. The patient is aware to call back for any questions or concerns.

## 2018-08-30 ENCOUNTER — Ambulatory Visit: Payer: Medicare Other | Admitting: Hematology and Oncology

## 2018-08-30 ENCOUNTER — Other Ambulatory Visit: Payer: Medicare Other

## 2018-08-31 ENCOUNTER — Encounter: Payer: Self-pay | Admitting: Hematology and Oncology

## 2018-08-31 ENCOUNTER — Inpatient Hospital Stay: Payer: Medicare Other | Attending: Hematology and Oncology

## 2018-08-31 ENCOUNTER — Inpatient Hospital Stay (HOSPITAL_BASED_OUTPATIENT_CLINIC_OR_DEPARTMENT_OTHER): Payer: Medicare Other | Admitting: Hematology and Oncology

## 2018-08-31 VITALS — BP 116/77 | HR 75 | Temp 97.4°F | Wt 145.3 lb

## 2018-08-31 DIAGNOSIS — Z853 Personal history of malignant neoplasm of breast: Secondary | ICD-10-CM | POA: Insufficient documentation

## 2018-08-31 DIAGNOSIS — C50412 Malignant neoplasm of upper-outer quadrant of left female breast: Secondary | ICD-10-CM

## 2018-08-31 DIAGNOSIS — Z923 Personal history of irradiation: Secondary | ICD-10-CM | POA: Insufficient documentation

## 2018-08-31 DIAGNOSIS — Z9012 Acquired absence of left breast and nipple: Secondary | ICD-10-CM

## 2018-08-31 DIAGNOSIS — C50912 Malignant neoplasm of unspecified site of left female breast: Secondary | ICD-10-CM | POA: Insufficient documentation

## 2018-08-31 DIAGNOSIS — Z170 Estrogen receptor positive status [ER+]: Secondary | ICD-10-CM

## 2018-08-31 DIAGNOSIS — D696 Thrombocytopenia, unspecified: Secondary | ICD-10-CM | POA: Insufficient documentation

## 2018-08-31 DIAGNOSIS — K219 Gastro-esophageal reflux disease without esophagitis: Secondary | ICD-10-CM | POA: Diagnosis not present

## 2018-08-31 DIAGNOSIS — M810 Age-related osteoporosis without current pathological fracture: Secondary | ICD-10-CM

## 2018-08-31 DIAGNOSIS — D1803 Hemangioma of intra-abdominal structures: Secondary | ICD-10-CM | POA: Insufficient documentation

## 2018-08-31 DIAGNOSIS — K2100 Gastro-esophageal reflux disease with esophagitis, without bleeding: Secondary | ICD-10-CM

## 2018-08-31 DIAGNOSIS — C50911 Malignant neoplasm of unspecified site of right female breast: Secondary | ICD-10-CM

## 2018-08-31 LAB — COMPREHENSIVE METABOLIC PANEL
ALT: 14 U/L (ref 0–44)
AST: 17 U/L (ref 15–41)
Albumin: 4.1 g/dL (ref 3.5–5.0)
Alkaline Phosphatase: 78 U/L (ref 38–126)
Anion gap: 7 (ref 5–15)
BUN: 17 mg/dL (ref 8–23)
CO2: 28 mmol/L (ref 22–32)
Calcium: 9.9 mg/dL (ref 8.9–10.3)
Chloride: 102 mmol/L (ref 98–111)
Creatinine, Ser: 0.79 mg/dL (ref 0.44–1.00)
GFR calc Af Amer: >60 mL/min (ref >60)
GFR calc non Af Amer: >60 mL/min (ref >60)
Glucose, Bld: 103 mg/dL — ABNORMAL HIGH (ref 70–99)
Potassium: 4.4 mmol/L (ref 3.5–5.1)
Sodium: 137 mmol/L (ref 135–145)
Total Bilirubin: 0.6 mg/dL (ref 0.3–1.2)
Total Protein: 7.4 g/dL (ref 6.5–8.1)

## 2018-08-31 LAB — CBC WITH DIFFERENTIAL/PLATELET
Basophils Absolute: 0.1 10*3/uL (ref 0–0.1)
Basophils Relative: 1 %
Eosinophils Absolute: 0.1 10*3/uL (ref 0–0.7)
Eosinophils Relative: 2 %
HCT: 38 % (ref 35.0–47.0)
Hemoglobin: 12.3 g/dL (ref 12.0–16.0)
Lymphocytes Relative: 16 %
Lymphs Abs: 0.9 10*3/uL — ABNORMAL LOW (ref 1.0–3.6)
MCH: 28.6 pg (ref 26.0–34.0)
MCHC: 32.3 g/dL (ref 32.0–36.0)
MCV: 88.4 fL (ref 80.0–100.0)
Monocytes Absolute: 0.5 10*3/uL (ref 0.2–0.9)
Monocytes Relative: 9 %
Neutro Abs: 4 10*3/uL (ref 1.4–6.5)
Neutrophils Relative %: 72 %
Platelets: 144 10*3/uL — ABNORMAL LOW (ref 150–440)
RBC: 4.29 MIL/uL (ref 3.80–5.20)
RDW: 16.5 % — ABNORMAL HIGH (ref 11.5–14.5)
WBC: 5.5 10*3/uL (ref 3.6–11.0)

## 2018-08-31 MED ORDER — OMEPRAZOLE 20 MG PO CPDR: 20.0000 mg | DELAYED_RELEASE_CAPSULE | Freq: Every day | ORAL | 0 refills | Status: AC

## 2018-08-31 NOTE — Progress Notes (Signed)
Patient states she had knee replacement several weeks ago.  Since that time she has had a lot of acid reflux. Otherwise offers no complaints.

## 2018-08-31 NOTE — Progress Notes (Signed)
[facility] day: 08/31/2018   Chief Complaint: Aimee Gutierrez is a 84 y.o. female with a history of bilateral breast cancer who is seen for 1 month assessment and initiation of tamoxifen.  HPI: The patient was last seen in the medical oncology clinic on 08/02/2018.  At that time, she was still recovering from her knee surgery.  She wished to postpone initiation of tamoxifen.  During the interim, patient has been having pain in her LEFT lower extremity s/p knee surgery 8 weeks ago. Patient is being followed by orthopedics Marry Guan, MD). Patient notes that she developed a post-operative hematoma. Patient states, "I nursed it for a month. They first told me it was a Baker's cyst". Patient has been in physical therapy. She notes that they have been trying to "heat the hematoma up and break it up", however she remains in pain. Plans are for a change in therapy and repeat imaging soon. Patient has been using APAP for pain, however she notes that it has caused her reflux to worsen. She is taking omeprazole 20 mg daily; asking for a refill today.   Patient denies that she has experienced any B symptoms. She denies any interval infections.  Patient does not verbalize any concerns with regards to her breasts today. Patient performs monthly self breast examinations as recommended.   Patient advises that she maintains an adequate appetite. She is eating well. Weight today is 145 lb 5 oz (65.9 kg), which compared to her last visit to the clinic, represents a  3 pound decrease.   Patient complains of pain rated 8/10  in the clinic today.   Past Medical History:  Diagnosis Date  . Arthritis   . Breast cancer ([place]) 2013   left breast, radiation  . Breast cancer of lower-outer quadrant of right female breast ([place]) 03/09/2015   3.5 mm invasive carcinoma, low-grade DCIS. ER 90%, PR greater than 50%, HER-2/neu not amplified. Axillary sentinel node and radiation deferred  based on age..  . Breast cancer, left ([place]) 03/26/2018   8 mm recurrent invasive mammary carcinoma, sentinel node negative.  ER/PR positive, HER-2/neu not overexpressed.  RpT1b rpN0(sn)   . Environmental allergies   . GERD (gastroesophageal reflux disease)   . Hypothyroidism   . IBS (irritable bowel syndrome)    in the past, diverticulitis in the past.  . Osteoporosis   . Personal history of radiation therapy 2016  . Personal history of radiation therapy 2013    Past Surgical History:  Procedure Laterality Date  . ABDOMINAL HYSTERECTOMY     1969  . BREAST BIOPSY Right 02/17/15   positive  . BREAST BIOPSY Left 03/13/2018   [place] core bx, INVASIVE MAMMARY CARCINOMA  . BREAST LUMPECTOMY Right 2016  . BREAST LUMPECTOMY Left 2013  . BREAST SURGERY Left 2013   [place] Right 03/09/15   wide excision  . CATARACT EXTRACTION  2013, 2015  . JOINT REPLACEMENT     right knee  . KNEE ARTHROPLASTY Left 07/04/2018   Procedure: COMPUTER ASSISTED TOTAL KNEE ARTHROPLASTY;  Surgeon: Dereck Leep, MD;  Location: [facility] ORS;  Service: Orthopedics;  Laterality: Left;  . OVARY SURGERY  2001   benign  . SENTINEL NODE BIOPSY Left 03/26/2018   Procedure: SENTINEL NODE BIOPSY;  Surgeon: Robert Bellow, MD;  Location: [facility] ORS;  Service: General;  Laterality: Left;  . SIMPLE MASTECTOMY WITH AXILLARY SENTINEL NODE BIOPSY Left 03/26/2018   Procedure:  SIMPLE MASTECTOMY;  Surgeon: Robert Bellow, MD;  Location: [facility];  Service: General;  Laterality: Left;  . TOTAL KNEE ARTHROPLASTY Right 10/12/2016   Procedure: TOTAL KNEE ARTHROPLASTY;  Surgeon: Dereck Leep, MD;  Location: [facility] OR[facility];  Service: Orthopedics;  Laterality: Right;  . VENTRAL HERNIA REPAIR N/A 02/01/2018   Procedure: HERNIA REPAIR VENTRAL ADULT;  Surgeon: Herbert Pun, MD;  Location: [facility] ORS;  Service: General;  Laterality: N/A;    Family History  Problem Relation Age of Onset  . Arthritis Mother   .  Heart disease Father   . Alzheimer's disease Sister   . Breast cancer Paternal Grandmother     Social History:  reports that she quit smoking about 8 years ago. She has never used smokeless tobacco. She reports that she does not drink alcohol or use drugs.  He son died on 2016-03-28 and her husband died on 04/11/16.  The patient is alone  today.    Allergies:  Allergies  Allergen Reactions  . Nitrofurantoin Nausea Only  . Latex Rash    Latex IgE was NEGATIVE (<0.10)  . Levothyroxine Nausea Only    Shaking, nausea, hypertension-can tolerate Synthroid  . Sulfa Antibiotics Rash    Current Medications: Current Outpatient Medications  Medication Sig Dispense Refill  . acetaminophen (TYLENOL) 500 MG tablet Take 500 mg by mouth 3 (three) times daily as needed for moderate pain.     . calcium carbonate (TUMS EX) 750 MG chewable tablet Chew 2 tablets by mouth at bedtime.     . Camphor-Eucalyptus-Menthol (VICKS VAPORUB EX) Apply 1 application topically daily as needed (applied to legs).    . Cholecalciferol (VITAMIN D) 2000 units tablet Take 4,000 Units by mouth at bedtime.     . Lactobacillus (FLORAJEN ACIDOPHILUS) CAPS Take 1 capsule by mouth every other day. At night    . omeprazole (PRILOSEC) 20 MG capsule Take 20 mg by mouth daily before breakfast.    . OVER THE COUNTER MEDICATION Place 1 application into both ears daily. EARGENE Soothing Ear Lotion--applied prior to placing hearing aids    . SYNTHROID 88 MCG tablet Take 88 mcg by mouth daily.    . traMADol (ULTRAM) 50 MG tablet Take 1-2 tablets (50-100 mg total) by mouth every 6 (six) hours as needed for moderate pain. 60 tablet 0  . enoxaparin (LOVENOX) 40 MG/0.4ML injection Inject 0.4 mLs (40 mg total) into the skin daily for 14 days. 14 Syringe 0  . oxyCODONE (OXY IR/ROXICODONE) 5 MG immediate release tablet Take 1 tablet (5 mg total) by mouth every 4 (four) hours as needed for moderate pain (pain score 4-6). (Patient not taking:  Reported on 08/14/2018) 30 tablet 0   No current facility-administered medications for this visit.     Review of Systems  Constitutional: Positive for weight loss (down 3 pounds). Negative for chills, diaphoresis, fever and malaise/fatigue.       Struggling with left knee.  HENT: Negative.  Negative for congestion, ear discharge, ear pain, nosebleeds, sinus pain and sore throat.   Eyes: Negative.  Negative for blurred vision, double vision, photophobia, pain and discharge.  Respiratory: Negative for cough, hemoptysis, sputum production, shortness of breath and wheezing.   Cardiovascular: Negative.  Negative for chest pain, palpitations, orthopnea, leg swelling and PND.  Gastrointestinal: Positive for heartburn. Negative for abdominal pain, blood in stool, constipation, diarrhea, melena, nausea and vomiting.  Genitourinary: Negative.  Negative for dysuria, frequency, hematuria and urgency.  Musculoskeletal: Positive for joint pain (  arthritis in hands. LEFT knee issues). Negative for back pain, falls, myalgias and neck pain.       S/p left knee surgery on 07/04/2018.  Skin: Negative.  Negative for itching and rash.  Neurological: Negative for dizziness, tremors, sensory change, speech change, focal weakness, weakness and headaches.  Endo/Heme/Allergies: Does not bruise/bleed easily.       HYPOthryroidism on levothyroxine  Psychiatric/Behavioral: Negative for depression, memory loss and suicidal ideas. The patient is not nervous/anxious and does not have insomnia.   All other systems reviewed and are negative.  Physical Exam: Blood pressure 116/77, pulse 75, temperature (!) 97.4 F (36.3 C), temperature source Tympanic, weight 145 lb 5 oz (65.9 kg). GENERAL:  Thin elderly woman sitting comfortably in the exam room in no acute distress.  She has a cane at her side. MENTAL STATUS:  Alert and oriented to person, place and time. HEAD:  Short gray hair.  Normocephalic, atraumatic, face symmetric,  no Cushingoid features. EYES:  Hazel eyes.  Pupils equal round and reactive to light and accomodation.  No conjunctivitis or scleral icterus. ENT:  Oropharynx clear without lesion.  Tongue normal. Mucous membranes moist.  RESPIRATORY:  Clear to auscultation without rales, wheezes or rhonchi. CARDIOVASCULAR:  Regular rate and rhythm without murmur, rub or gallop. BREAST:  Left mastectomy with erythema or nodularity.  Right breast with fibrocystic changes superiorly.  No masses, skin changes or nipple discharge. ABDOMEN:  Soft, non-tender, with active bowel sounds, and no hepatosplenomegaly.  No masses. SKIN:  No rashes, ulcers or lesions. EXTREMITIES: s/p left knee surgery.  No edema, no skin discoloration or tenderness.  No palpable cords. LYMPH NODES: No palpable cervical, supraclavicular, axillary or inguinal adenopathy  NEUROLOGICAL: Unremarkable. PSYCH:  Appropriate.    Appointment on 08/31/2018  Component Date Value Ref Range Status  . Sodium 08/31/2018 137  135 - 145 mmol/L Final  . Potassium 08/31/2018 4.4  3.5 - 5.1 mmol/L Final  . Chloride 08/31/2018 102  98 - 111 mmol/L Final  . CO2 08/31/2018 28  22 - 32 mmol/L Final  . Glucose, Bld 08/31/2018 103* 70 - 99 mg/dL Final  . BUN 08/31/2018 17  8 - 23 mg/dL Final  . Creatinine, Ser 08/31/2018 0.79  0.44 - 1.00 mg/dL Final  . Calcium 08/31/2018 9.9  8.9 - 10.3 mg/dL Final  . Total Protein 08/31/2018 7.4  6.5 - 8.1 g/dL Final  . Albumin 08/31/2018 4.1  3.5 - 5.0 g/dL Final  . AST 08/31/2018 17  15 - 41 U/L Final  . ALT 08/31/2018 14  0 - 44 U/L Final  . Alkaline Phosphatase 08/31/2018 78  38 - 126 U/L Final  . Total Bilirubin 08/31/2018 0.6  0.3 - 1.2 mg/dL Final  . GFR calc non Af Amer 08/31/2018 >60  >60 mL/min Final  . GFR calc Af Amer 08/31/2018 >60  >60 mL/min Final   Comment: (NOTE) The eGFR has been calculated using the CKD EPI equation. This calculation has not been validated in all clinical situations. eGFR's  persistently <60 mL/min signify possible Chronic Kidney Disease.   Georgiann Hahn gap 08/31/2018 7  5 - 15 Final   Performed at [facility], [address] [place], [place] [address]  . WBC 08/31/2018 5.5  3.6 - 11.0 K/uL Final  . RBC 08/31/2018 4.29  3.80 - 5.20 MIL/uL Final  . Hemoglobin 08/31/2018 12.3  12.0 - 16.0 g/dL Final  . HCT 08/31/2018 38.0  35.0 - 47.0 % Final  .  MCV 08/31/2018 88.4  80.0 - 100.0 fL Final  . MCH 08/31/2018 28.6  26.0 - 34.0 pg Final  . MCHC 08/31/2018 32.3  32.0 - 36.0 g/dL Final  . RDW 08/31/2018 16.5* 11.5 - 14.5 % Final  . Platelets 08/31/2018 144* 150 - 440 K/uL Final  . Neutrophils Relative % 08/31/2018 72  % Final  . Neutro Abs 08/31/2018 4.0  1.4 - 6.5 K/uL Final  . Lymphocytes Relative 08/31/2018 16  % Final  . Lymphs Abs 08/31/2018 0.9* 1.0 - 3.6 K/uL Final  . Monocytes Relative 08/31/2018 9  % Final  . Monocytes Absolute 08/31/2018 0.5  0.2 - 0.9 K/uL Final  . Eosinophils Relative 08/31/2018 2  % Final  . Eosinophils Absolute 08/31/2018 0.1  0 - 0.7 K/uL Final  . Basophils Relative 08/31/2018 1  % Final  . Basophils Absolute 08/31/2018 0.1  0 - 0.1 K/uL Final   Performed at [facility], [address] [place], [place] [address]    Assessment:  Aimee Gutierrez is a 84 y.o. female with a history of bilateral breast cancer and chronic mild thrombocytopenia..   She initially presented with stage I left breast cancer s/p lumpectomy and sentinel lymph node biopsy on 03/09/2012. Initial biopsy revealed a 7 mm lesion in the left breast. Pathology revealed a 1 mm focus of residual invasive mammry carcinoma with DCIS. Stage T1aN0M0. Tumor was ER/PR+. Her2/neu was not assessed.  She received radiation from 04/26/2012 to 06/20/2012. She declined hormonal therapy.   Mammogram on 02/13/2014 revealed no evidence of maligancy. Mammogram and ultrasound on 02/16/2015 revealed a 5 x 3 x 2 mm hypoechoic mass at the 4 o'clock position of the right  breast. Core biopsy on 02/17/2015 revealed low grade DCIS, cribiform type. Estrogen receptor was positive (> 90%) and progesterone receptor positive (> 90%).   She underwent wide excision of the right breast on 03/09/2015. Pathology revealed a 3.5 mm focus of grade II invasive carcinoma with ductal carcinoma in situ. Margins were negative. No lymph nodes were removed. Tumor was ER positive (> 90%), PR positive (51-90%) and Her2/neu negative. Pathologic stage was T1aNxMx. She completed radiation on 05/13/2015.  She decided against hormonal therapy.  Bilateral mammogram on 02/21/2017 revealed stable post lumpectomy changes bilaterally.  Diagnostic mammogram on 03/02/2018 revealing scattered areas of fiber glandular density.  Targeted ultrasound was performed that demonstrated a 6 x 8 x 8 mm irregular hypoechoic LEFT breast mass at the 3 o'clock position, located 3 cm from the nipple.    LEFT breast biopsy on 03/13/2018.  Pathology revealed a grade II invasive mammary carcinoma.  She underwent left simple mastectomy and sentinel lymph node biopsy on 03/26/2018.  Pathology revealed an 8 mm grade II invasive carcinoma of no special type.  DCIS was present.  There was no lymphovascular invasion.  Margins were negative.  Tumor was ER + (> 90%), PR + (1%), and Her2/neu -.  Pathologic stage was rpT1b rpN0 (sn).  CA27.29 has been followed: 24.0 on 04/16/2015, 19.2 on 08/03/2015, 11.8 on 11/10/2015, 14.3 on 04/29/2016, 12.2 on 10/06/2016, 17.7 on 02/23/2017, 13.1 on 05/26/2017, 17 on 10/13/2017, 8.1 on 04/12/2018, and 11.8 on 08/02/2018.  Bone density study on 05/25/2015 revealed osteoporosis (T score of -3.3 in the femur neck and -3.5 in the forearm). Bone density on 05/25/2017 revealed osteoporosis with a T-score of -3.4 in the left femoral neck and -3.4 in the left forearm radius.  She is taking calcium and vitamin D.  She  is not interested in Prolia or Fosamax; readdressed today and patient remains  adamant that she does not wish to taking anything.   She has persistent thrombocytopenia c/w mild chronic ITP.  Platelet count has ranged between 96,000 - 161,000 without trend.   Abdominal ultrasound on 01/18/2018 showed a 1.1 cm hyperechoic right liver lobe lesion, felt to be most consistent with a hemangioma.    CT abdomen and pelvis on 01/30/2018 that revealed a 1.2 cm defect within the anterior abdominal wall through which omentum/fat and vessels transverses and appeared inflamed. 1 cm lesion within the posterior right liver lobe again mentioned, and felt to quite possibly a hemangioma.  There was a 1.4 cm lesion at dome of the left lobe of the liver. There was sigmoid diverticulosis with prominent muscular hypertrophy.  Trace free fluid in the right aspect of the pelvis of indeterminate etiology noted.  Small bowel loops that entered the right inguinal canal that did not cause proximal obstruction.  Liver MRI on 02/09/2018 confirmed that the previously demonstrated hepatic lesions were hemangiomas.  There was no evidence of metastatic breast cancer.  There was an enlarging fat-containing ventral hernia that appeared inflamed.  There was questionable incarceration. She underwent ventral hernia repair on 02/01/2018.  She underwent total left knee arthroplasty on 07/04/2018.  Symptomatically, she continue to struggle with her left knee.  Breast exam is unremarkable.  Plan: 1.  Labs today:  CBC with diff, CMP.  2.  Bilateral breast cancer:  Patient s/p left mastectomy for recurrent breast cancer.  Patient has been hesitant to start hormonal therapy.  Delay in initiating tamoxifen secondary to prolonged recovery from knee surgery.  Discuss endocrine therapy (tamoxifen versus aromatase inhibitor).  Side effects re-reviewed.  Patient concerned about her baseline osteoporosis and an AI and tamoxifen and risk of clot s/p knee surgery and decreased mobility. 3.  Osteoporosis:  Patient aware, but  declines treatment.  Patient states "I don't want to do anything that might make me worse".  Continue calcium and vitamin D. 4.  Reflux:  Patient requests refill of omeprazole.  Rx: omeprazole 20 mg a day (disp: 3 month supply). 5.  Patient to call when ready to institute endocrine therapy. 6.  RTC in 3 months  MD assessment and labs (CBC with diff, CMP, CA27.29)   Honor Loh, NP 08/31/2018, 11:31 AM   I saw and evaluated the patient, participating in the key portions of the service and reviewing pertinent diagnostic studies and records.  I reviewed the nurse practitioner's note and agree with the findings and the plan.  The assessment and plan were discussed with the patient.  Multiple questions were asked by the patient and answered.   Nolon Stalls, MD 08/31/2018,11:31 AM

## 2018-10-16 ENCOUNTER — Encounter: Payer: Self-pay | Admitting: General Surgery

## 2018-10-16 ENCOUNTER — Ambulatory Visit (INDEPENDENT_AMBULATORY_CARE_PROVIDER_SITE_OTHER): Payer: Medicare Other | Admitting: General Surgery

## 2018-10-16 ENCOUNTER — Other Ambulatory Visit: Payer: Self-pay

## 2018-10-16 VITALS — BP 130/74 | HR 80 | Temp 97.2°F | Resp 15 | Ht 67.0 in | Wt 146.2 lb

## 2018-10-16 DIAGNOSIS — N6489 Other specified disorders of breast: Secondary | ICD-10-CM | POA: Diagnosis not present

## 2018-10-16 NOTE — Patient Instructions (Signed)
Patient is to return in 2 months to follow up for left breast seroma. She is to call the office with any questions or concerns.

## 2018-10-16 NOTE — Progress Notes (Signed)
Patient ID: Aimee Gutierrez, female   DOB: Dec 04, 1933, 84 y.o.   MRN: [id number]  Chief Complaint  Patient presents with  . Follow-up     2 mo f/u breast seroma,     HPI Aimee Gutierrez is a 84 y.o. female here today for 2 mo f/u breast seroma. Patient states she feels a small amount of fluid is in the left breast would not prefer to have it drained but will if she has too. States she has a slight cough but feels she is well other wise.     HPI  Past Medical History:  Diagnosis Date  . Arthritis   . Breast cancer ([place]) 2013   left breast, radiation  . Breast cancer of lower-outer quadrant of right female breast ([place]) 03/09/2015   3.5 mm invasive carcinoma, low-grade DCIS. ER 90%, PR greater than 50%, HER-2/neu not amplified. Axillary sentinel node and radiation deferred based on age..  . Breast cancer, left ([place]) 03/26/2018   8 mm recurrent invasive mammary carcinoma, sentinel node negative.  ER/PR positive, HER-2/neu not overexpressed.  RpT1b rpN0(sn)   . Environmental allergies   . GERD (gastroesophageal reflux disease)   . Hypothyroidism   . IBS (irritable bowel syndrome)    in the past, diverticulitis in the past.  . Osteoporosis   . Personal history of radiation therapy 2016  . Personal history of radiation therapy 2013    Past Surgical History:  Procedure Laterality Date  . ABDOMINAL HYSTERECTOMY     1969  . BREAST BIOPSY Right 02/17/15   positive  . BREAST BIOPSY Left 03/13/2018   [place] core bx, INVASIVE MAMMARY CARCINOMA  . BREAST LUMPECTOMY Right 2016  . BREAST LUMPECTOMY Left 2013  . BREAST SURGERY Left 2013   [place] Right 03/09/15   wide excision  . CATARACT EXTRACTION  2013, 2015  . JOINT REPLACEMENT     right knee  . KNEE ARTHROPLASTY Left 07/04/2018   Procedure: COMPUTER ASSISTED TOTAL KNEE ARTHROPLASTY;  Surgeon: Dereck Leep, MD;  Location: [facility] ORS;  Service: Orthopedics;  Laterality: Left;  . OVARY SURGERY  2001   benign  . SENTINEL  NODE BIOPSY Left 03/26/2018   Procedure: SENTINEL NODE BIOPSY;  Surgeon: Robert Bellow, MD;  Location: [facility]S;  Service: General;  Laterality: Left;  . SIMPLE MASTECTOMY WITH AXILLARY SENTINEL NODE BIOPSY Left 03/26/2018   Procedure: SIMPLE MASTECTOMY;  Surgeon: Robert Bellow, MD;  Location: [facility];  Service: General;  Laterality: Left;  . TOTAL KNEE ARTHROPLASTY Right 10/12/2016   Procedure: TOTAL KNEE ARTHROPLASTY;  Surgeon: Dereck Leep, MD;  Location: [facility];  Service: Orthopedics;  Laterality: Right;  . VENTRAL HERNIA REPAIR N/A 02/01/2018   Procedure: HERNIA REPAIR VENTRAL ADULT;  Surgeon: Herbert Pun, MD;  Location: [facility] ORS;  Service: General;  Laterality: N/A;    Family History  Problem Relation Age of Onset  . Arthritis Mother   . Heart disease Father   . Alzheimer's disease Sister   . Breast cancer Paternal Grandmother     Social History Social History   Tobacco Use  . Smoking status: Former Smoker    Last attempt to quit: 12/05/2009    Years since quitting: 8.8  . Smokeless tobacco: Never Used  Substance Use Topics  . Alcohol use: No    Alcohol/week: 0.0 standard drinks    Comment: rare glass of wine  . Drug use: No    Allergies  Allergen  Reactions  . Nitrofurantoin Nausea Only  . Latex Rash    Latex IgE was NEGATIVE (<0.10)  . Levothyroxine Nausea Only    Shaking, nausea, hypertension-can tolerate Synthroid  . Sulfa Antibiotics Rash    Current Outpatient Medications  Medication Sig Dispense Refill  . acetaminophen (TYLENOL) 500 MG tablet Take 500 mg by mouth 3 (three) times daily as needed for moderate pain (as needed).     . calcium carbonate (TUMS EX) 750 MG chewable tablet Chew 2 tablets by mouth at bedtime.     . Camphor-Eucalyptus-Menthol (VICKS VAPORUB EX) Apply 1 application topically daily as needed (applied to legs).    . Cholecalciferol (VITAMIN D) 2000 units tablet Take 4,000 Units by mouth at bedtime.     . Lactobacillus  (FLORAJEN ACIDOPHILUS) CAPS Take 1 capsule by mouth every other day. At night    . omeprazole (PRILOSEC) 20 MG capsule Take 1 capsule (20 mg total) by mouth daily before breakfast. 90 capsule 0  . OVER THE COUNTER MEDICATION Place 1 application into both ears daily. EARGENE Soothing Ear Lotion--applied prior to placing hearing aids    . SYNTHROID 88 MCG tablet Take 88 mcg by mouth daily.     No current facility-administered medications for this visit.     Review of Systems Review of Systems  Blood pressure 130/74, pulse 80, temperature (!) 97.2 F (36.2 C), temperature source Temporal, resp. rate 15, height '5\' 7"'$  (1.702 m), weight 146 lb 3.2 oz (66.3 kg), SpO2 97 %.  Physical Exam Physical Exam  Constitutional: She is oriented to person, place, and time. She appears well-developed and well-nourished.  Eyes: Conjunctivae are normal. No scleral icterus.  Neck: Normal range of motion.  Cardiovascular: Normal rate, regular rhythm and normal heart sounds.  Pulmonary/Chest: Effort normal and breath sounds normal. Right breast exhibits no inverted nipple, no mass, no nipple discharge, no skin change and no tenderness. Breasts are symmetrical.    Lymphadenopathy:    She has no cervical adenopathy.  Neurological: She is alert and oriented to person, place, and time.  Skin: Skin is warm and dry.       Assessment    Small seroma, minimally symptomatic, resolved post aspiration.    Plan  Patient is to return in 2 months to follow up for left breast seroma. She is to call the office with any questions or concerns.  HPI, Physical Exam, Assessment and Plan have been scribed under the direction and in the presence of Hervey Ard, Md.  Eudelia Bunch R. Bobette Mo, CMA  I have completed the exam and reviewed the above documentation for accuracy and completeness.  I agree with the above.  Haematologist has been used and any errors in dictation or transcription are unintentional.  Hervey Ard, M.D., F.A.C.S.  Forest Gleason Byrnett 10/16/2018, 9:27 PM

## 2018-12-10 ENCOUNTER — Other Ambulatory Visit: Payer: Self-pay | Admitting: *Deleted

## 2018-12-10 ENCOUNTER — Other Ambulatory Visit: Payer: Self-pay

## 2018-12-10 ENCOUNTER — Inpatient Hospital Stay: Payer: Medicare Other | Attending: Oncology

## 2018-12-10 ENCOUNTER — Other Ambulatory Visit: Payer: Self-pay | Admitting: Oncology

## 2018-12-10 ENCOUNTER — Inpatient Hospital Stay (HOSPITAL_BASED_OUTPATIENT_CLINIC_OR_DEPARTMENT_OTHER): Payer: Medicare Other | Admitting: Oncology

## 2018-12-10 VITALS — BP 142/81 | HR 79 | Temp 97.2°F | Resp 18 | Wt 143.3 lb

## 2018-12-10 DIAGNOSIS — E039 Hypothyroidism, unspecified: Secondary | ICD-10-CM | POA: Insufficient documentation

## 2018-12-10 DIAGNOSIS — K219 Gastro-esophageal reflux disease without esophagitis: Secondary | ICD-10-CM | POA: Diagnosis not present

## 2018-12-10 DIAGNOSIS — Z9012 Acquired absence of left breast and nipple: Secondary | ICD-10-CM

## 2018-12-10 DIAGNOSIS — Z170 Estrogen receptor positive status [ER+]: Secondary | ICD-10-CM | POA: Insufficient documentation

## 2018-12-10 DIAGNOSIS — M810 Age-related osteoporosis without current pathological fracture: Secondary | ICD-10-CM | POA: Diagnosis not present

## 2018-12-10 DIAGNOSIS — Z7982 Long term (current) use of aspirin: Secondary | ICD-10-CM

## 2018-12-10 DIAGNOSIS — C50911 Malignant neoplasm of unspecified site of right female breast: Secondary | ICD-10-CM

## 2018-12-10 DIAGNOSIS — C50412 Malignant neoplasm of upper-outer quadrant of left female breast: Secondary | ICD-10-CM

## 2018-12-10 DIAGNOSIS — Z853 Personal history of malignant neoplasm of breast: Secondary | ICD-10-CM

## 2018-12-10 DIAGNOSIS — Z923 Personal history of irradiation: Secondary | ICD-10-CM | POA: Insufficient documentation

## 2018-12-10 DIAGNOSIS — Z79810 Long term (current) use of selective estrogen receptor modulators (SERMs): Secondary | ICD-10-CM

## 2018-12-10 DIAGNOSIS — C50912 Malignant neoplasm of unspecified site of left female breast: Secondary | ICD-10-CM | POA: Diagnosis present

## 2018-12-10 DIAGNOSIS — Z79899 Other long term (current) drug therapy: Secondary | ICD-10-CM | POA: Insufficient documentation

## 2018-12-10 DIAGNOSIS — Z86000 Personal history of in-situ neoplasm of breast: Secondary | ICD-10-CM | POA: Diagnosis not present

## 2018-12-10 DIAGNOSIS — Z87891 Personal history of nicotine dependence: Secondary | ICD-10-CM | POA: Insufficient documentation

## 2018-12-10 LAB — CBC WITH DIFFERENTIAL/PLATELET
Abs Immature Granulocytes: 0.01 10*3/uL (ref 0.00–0.07)
Basophils Absolute: 0.1 10*3/uL (ref 0.0–0.1)
Basophils Relative: 1 %
Eosinophils Absolute: 0.2 10*3/uL (ref 0.0–0.5)
Eosinophils Relative: 4 %
HCT: 42.5 % (ref 36.0–46.0)
Hemoglobin: 13.3 g/dL (ref 12.0–15.0)
Immature Granulocytes: 0 %
Lymphocytes Relative: 19 %
Lymphs Abs: 1 10*3/uL (ref 0.7–4.0)
MCH: 28.3 pg (ref 26.0–34.0)
MCHC: 31.3 g/dL (ref 30.0–36.0)
MCV: 90.4 fL (ref 80.0–100.0)
Monocytes Absolute: 0.4 10*3/uL (ref 0.1–1.0)
Monocytes Relative: 8 %
Neutro Abs: 3.4 10*3/uL (ref 1.7–7.7)
Neutrophils Relative %: 68 %
Platelets: 162 10*3/uL (ref 150–400)
RBC: 4.7 MIL/uL (ref 3.87–5.11)
RDW: 15.3 % (ref 11.5–15.5)
WBC: 5 10*3/uL (ref 4.0–10.5)
nRBC: 0 % (ref 0.0–0.2)

## 2018-12-10 LAB — COMPREHENSIVE METABOLIC PANEL
ALT: 17 U/L (ref 0–44)
AST: 18 U/L (ref 15–41)
Albumin: 4.2 g/dL (ref 3.5–5.0)
Alkaline Phosphatase: 92 U/L (ref 38–126)
Anion gap: 8 (ref 5–15)
BUN: 18 mg/dL (ref 8–23)
CO2: 29 mmol/L (ref 22–32)
Calcium: 9.5 mg/dL (ref 8.9–10.3)
Chloride: 104 mmol/L (ref 98–111)
Creatinine, Ser: 0.77 mg/dL (ref 0.44–1.00)
GFR calc Af Amer: >60 mL/min (ref >60)
GFR calc non Af Amer: >60 mL/min (ref >60)
Glucose, Bld: 105 mg/dL — ABNORMAL HIGH (ref 70–99)
Potassium: 4.1 mmol/L (ref 3.5–5.1)
Sodium: 141 mmol/L (ref 135–145)
Total Bilirubin: 0.4 mg/dL (ref 0.3–1.2)
Total Protein: 7.3 g/dL (ref 6.5–8.1)

## 2018-12-10 MED ORDER — TAMOXIFEN CITRATE 20 MG PO TABS: 20.0000 mg | ORAL_TABLET | Freq: Every day | ORAL | 3 refills | Status: DC

## 2018-12-10 NOTE — Progress Notes (Signed)
Pt here for follow up -former pt of Dr Drenda Freeze ,  Doing " well " per pt.

## 2018-12-11 LAB — CANCER ANTIGEN 27.29: CA 27.29: 11.2 U/mL (ref 0.0–38.6)

## 2018-12-12 ENCOUNTER — Encounter: Payer: Self-pay | Admitting: Oncology

## 2018-12-12 NOTE — Progress Notes (Signed)
Hematology/Oncology Consult note [facility]  [phone] [phone]  Patient Care Team: Adin Hector, MD as PCP - General (Internal Medicine) Lequita Asal, MD as Referring Physician (Hematology and Oncology) Bary Castilla Forest Gleason, MD (General Surgery)   Name of the patient: Aimee Gutierrez  [id number]  1933-05-23   Date of visit: 12/12/18  Diagnosis-history of recurrent breast cancer  Chief complaint/ Reason for visit-discuss hormone therapy options  Heme/Onc history: Patient is a 85 year old female who has had 3 prior breast cancers.  The first 1 was in April 2013 T1 a N0 M0 ER PR positive status post lumpectomy and radiation treatment.  Patient declined hormone therapy.  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  She had right breast cancer in April 2016 which showed 3.5 mm grade 2 invasive carcinoma with DCIS ER PR positive HER-2/neu negative completed radiation but declined hormone therapy.  In April 2019 she was found to have stage I left breast cancer 8 mm grade 2 ER PR positive HER-2/neu negative.  She underwent a left mastectomy with sentinel lymph node biopsy.  Did not require adjuvant radiation treatment but has not started any hormone therapy yet.  Patient was initially seen by Dr. Mike Gip.  She underwent a bone density scan which showed osteoporosis and therefore tamoxifen was preferred over AI  Interval history- Patient is ye to st taking tamoxifen. She wants to take least amount of medications. She does not wish to to take any medications for her osteoporosis  ECOG PS- 1 Pain scale- 0 Opioid associated constipation- no  Review of systems- Review of Systems  Constitutional: Negative for chills, fever, malaise/fatigue and weight loss.  HENT: Negative for congestion, ear discharge and nosebleeds.   Eyes: Negative for blurred vision.    Respiratory: Negative for cough, hemoptysis, sputum production, shortness of breath and wheezing.   Cardiovascular: Negative for chest pain, palpitations, orthopnea and claudication.  Gastrointestinal: Positive for heartburn. Negative for abdominal pain, blood in stool, constipation, diarrhea, melena, nausea and vomiting.  Genitourinary: Negative for dysuria, flank pain, frequency, hematuria and urgency.  Musculoskeletal: Positive for joint pain. Negative for back pain and myalgias.  Skin: Negative for rash.  Neurological: Negative for dizziness, tingling, focal weakness, seizures, weakness and headaches.  Endo/Heme/Allergies: Does not bruise/bleed easily.  Psychiatric/Behavioral: Negative for depression and suicidal ideas. The patient does not have insomnia.        Allergies  Allergen Reactions  . Nitrofurantoin Nausea Only  . Latex Rash    Latex IgE was NEGATIVE (<0.10)  . Levothyroxine Nausea Only    Shaking, nausea, hypertension-can tolerate Synthroid  . Sulfa Antibiotics Rash     Past Medical History:  Diagnosis Date  . Arthritis   . Breast cancer ([place]) 2013   left breast, radiation  . Breast cancer of lower-outer quadrant of right female breast ([place]) 03/09/2015   3.5 mm invasive carcinoma, low-grade DCIS. ER 90%, PR greater than 50%, HER-2/neu not amplified. Axillary sentinel node and radiation deferred based on age..  . Breast cancer, left ([place]) 03/26/2018   8 mm recurrent invasive mammary carcinoma, sentinel node negative.  ER/PR positive, HER-2/neu not overexpressed.  RpT1b rpN0(sn)   . Environmental allergies   . GERD (gastroesophageal reflux disease)   . Hypothyroidism   . IBS (irritable bowel syndrome)    in the past, diverticulitis in the past.  . Osteoporosis   .  Personal history of radiation therapy 2016  . Personal history of radiation therapy 2013     Past Surgical History:  Procedure Laterality Date  . ABDOMINAL HYSTERECTOMY     1969  . BREAST  BIOPSY Right 02/17/15   positive  . BREAST BIOPSY Left 03/13/2018   [place] core bx, INVASIVE MAMMARY CARCINOMA  . BREAST LUMPECTOMY Right 2016  . BREAST LUMPECTOMY Left 2013  . BREAST SURGERY Left 2013   [place] Right 03/09/15   wide excision  . CATARACT EXTRACTION  2013, 2015  . JOINT REPLACEMENT     right knee  . KNEE ARTHROPLASTY Left 07/04/2018   Procedure: COMPUTER ASSISTED TOTAL KNEE ARTHROPLASTY;  Surgeon: Dereck Leep, MD;  Location: [facility] ORS;  Service: Orthopedics;  Laterality: Left;  . OVARY SURGERY  2001   benign  . SENTINEL NODE BIOPSY Left 03/26/2018   Procedure: SENTINEL NODE BIOPSY;  Surgeon: Robert Bellow, MD;  Location: [facility] ORS;  Service: General;  Laterality: Left;  . SIMPLE MASTECTOMY WITH AXILLARY SENTINEL NODE BIOPSY Left 03/26/2018   Procedure: SIMPLE MASTECTOMY;  Surgeon: Robert Bellow, MD;  Location: [facility] ORS;  Service: General;  Laterality: Left;  . TOTAL KNEE ARTHROPLASTY Right 10/12/2016   Procedure: TOTAL KNEE ARTHROPLASTY;  Surgeon: Dereck Leep, MD;  Location: [facility] ORS;  Service: Orthopedics;  Laterality: Right;  . VENTRAL HERNIA REPAIR N/A 02/01/2018   Procedure: HERNIA REPAIR VENTRAL ADULT;  Surgeon: Herbert Pun, MD;  Location: [facility] ORS;  Service: General;  Laterality: N/A;    Social History   Socioeconomic History  . Marital status: Widowed    Spouse name: Not on file  . Number of children: Not on file  . Years of education: Not on file  . Highest education level: Not on file  Occupational History  . Not on file  Social Needs  . Financial resource strain: Not on file  . Food insecurity:    Worry: Not on file    Inability: Not on file  . Transportation needs:    Medical: Not on file    Non-medical: Not on file  Tobacco Use  . Smoking status: Former Smoker    Last attempt to quit: 12/05/2009    Years since quitting: 9.0  . Smokeless tobacco: Never Used  Substance and Sexual Activity  . Alcohol use:  No    Alcohol/week: 0.0 standard drinks    Comment: rare glass of wine  . Drug use: No  . Sexual activity: Never  Lifestyle  . Physical activity:    Days per week: Not on file    Minutes per session: Not on file  . Stress: Not on file  Relationships  . Social connections:    Talks on phone: Not on file    Gets together: Not on file    Attends religious service: Not on file    Active member of club or organization: Not on file    Attends meetings of clubs or organizations: Not on file    Relationship status: Not on file  . Intimate partner violence:    Fear of current or ex partner: Not on file    Emotionally abused: Not on file    Physically abused: Not on file    Forced sexual activity: Not on file  Other Topics Concern  . Not on file  Social History Narrative  . Not on file    Family History  Problem Relation Age of Onset  . Arthritis  Mother   . Heart disease Father   . Alzheimer's disease Sister   . Breast cancer Paternal Grandmother      Current Outpatient Medications:  .  aspirin EC 81 MG tablet, Take by mouth., Disp: , Rfl:  .  calcium carbonate (TUMS EX) 750 MG chewable tablet, Chew 2 tablets by mouth at bedtime. , Disp: , Rfl:  .  Cholecalciferol (VITAMIN D) 2000 units tablet, Take 4,000 Units by mouth at bedtime. , Disp: , Rfl:  .  Lactobacillus (FLORAJEN ACIDOPHILUS) CAPS, Take 1 capsule by mouth every other day. At night, Disp: , Rfl:  .  OVER THE COUNTER MEDICATION, Place 1 application into both ears daily. EARGENE Soothing Ear Lotion--applied prior to placing hearing aids, Disp: , Rfl:  .  SYNTHROID 88 MCG tablet, Take 88 mcg by mouth daily., Disp: , Rfl:  .  acetaminophen (TYLENOL) 500 MG tablet, Take 500 mg by mouth 3 (three) times daily as needed for moderate pain (as needed). , Disp: , Rfl:  .  Camphor-Eucalyptus-Menthol (VICKS VAPORUB EX), Apply 1 application topically daily as needed (applied to legs)., Disp: , Rfl:  .  omeprazole (PRILOSEC) 20 MG  capsule, Take 1 capsule (20 mg total) by mouth daily before breakfast. (Patient not taking: Reported on 12/10/2018), Disp: 90 capsule, Rfl: 0 .  tamoxifen (NOLVADEX) 20 MG tablet, Take 1 tablet (20 mg total) by mouth daily., Disp: 30 tablet, Rfl: 3  Physical exam:  Vitals:   12/10/18 1434  BP: (!) 142/81  Pulse: 79  Resp: 18  Temp: (!) 97.2 F (36.2 C)  TempSrc: Tympanic  Weight: 143 lb 4.8 oz (65 kg)   Physical Exam Constitutional:      General: She is not in acute distress. HENT:     Head: Normocephalic and atraumatic.  Eyes:     Pupils: Pupils are equal, round, and reactive to light.  Neck:     Musculoskeletal: Normal range of motion.  Cardiovascular:     Rate and Rhythm: Normal rate and regular rhythm.     Heart sounds: Normal heart sounds.  Pulmonary:     Effort: Pulmonary effort is normal.     Breath sounds: Normal breath sounds.  Abdominal:     General: Bowel sounds are normal.     Palpations: Abdomen is soft.  Skin:    General: Skin is warm and dry.  Neurological:     Mental Status: She is alert and oriented to person, place, and time.   Patient is status post left mastectomy without reconstruction.  No evidence of chest wall recurrence.  No evidence of palpable bilateral axillary adenopathy.  Patient is status post right lumpectomy with a well-healed surgical scar  CMP Latest Ref Rng & Units 12/10/2018  Glucose 70 - 99 mg/dL 105(H)  BUN 8 - 23 mg/dL 18  Creatinine 0.44 - 1.00 mg/dL 0.77  Sodium 135 - 145 mmol/L 141  Potassium 3.5 - 5.1 mmol/L 4.1  Chloride 98 - 111 mmol/L 104  CO2 22 - 32 mmol/L 29  Calcium 8.9 - 10.3 mg/dL 9.5  Total Protein 6.5 - 8.1 g/dL 7.3  Total Bilirubin 0.3 - 1.2 mg/dL 0.4  Alkaline Phos 38 - 126 U/L 92  AST 15 - 41 U/L 18  ALT 0 - 44 U/L 17   CBC Latest Ref Rng & Units 12/10/2018  WBC 4.0 - 10.5 K/uL 5.0  Hemoglobin 12.0 - 15.0 g/dL 13.3  Hematocrit 36.0 - 46.0 % 42.5  Platelets 150 - 400  K/uL 162     Assessment and plan-  Patient is a 85 y.o. female with h/o 3 recurrent breast cancer most recent one in the left breast in April 2019 ER PR positive here to discuss hormone therapy  Dr. Mike Gip had discussed initiating tamoxifen multiple times with the patient in the past. She had been delaying it for some reason. I again went over the benefits of tamoxifen to be taken for atleast 5 years to decrease the risk of breast cancer recurrence. Discussed risks and benefits of tamoxifen including all but not limited to hot flashes, fatigue, mood swings, increased risk of cataracts and DVT as well as uterine cancer.  Patient understands and agrees to proceed as planned.  We will send in a prescription for tamoxifen today.  I have also encouraged her to take calcium and vitamin D.  Patient is not interested in initiating any bisphosphonates for her osteoporosis at this time.  We will also schedule her right breast mammogram from March 2020.  I will see her back in 6 weeks time to see how she is tolerating tamoxifen and if she has any questions or concerns   Total face to face encounter time for this patient visit was 30 min. >50% of the time was  spent in counseling and coordination of care.     Visit Diagnosis 1. Malignant neoplasm of upper-outer quadrant of left breast in female, estrogen receptor positive ([place])   2. Invasive ductal carcinoma of right breast, stage 1 (HCC)      Dr. Randa Evens, MD, MPH [facility] at [facility] [phone] 12/12/2018 9:25 AM

## 2018-12-13 ENCOUNTER — Ambulatory Visit: Payer: Medicare Other | Admitting: General Surgery

## 2018-12-13 ENCOUNTER — Telehealth: Payer: Self-pay | Admitting: *Deleted

## 2018-12-13 NOTE — Telephone Encounter (Signed)
Patient called requesting her tumor marker results from Monday. I gave her the result of 11.2. I told her the Aug 2019 result was 11.8.

## 2018-12-17 ENCOUNTER — Telehealth: Payer: Self-pay | Admitting: *Deleted

## 2018-12-17 NOTE — Telephone Encounter (Signed)
Patient want to wait for Dr. Bary Castilla. For her appt.

## 2018-12-25 IMAGING — CT CT ANGIO CHEST
1 of 2 series · 19 of 32 positions shown · IV contrast (isovue)
Comparison: None.

CLINICAL DATA: Shortness of breath and chest tightness

EXAM:
CT ANGIOGRAPHY CHEST WITH CONTRAST
TECHNIQUE: Multidetector CT imaging of the chest was performed using the
standard protocol during bolus administration of intravenous
contrast. Multiplanar CT image reconstructions and MIPs were
obtained to evaluate the vascular anatomy.
CONTRAST:  75 mL Isovue 370 nonionic

[Series 6: thins · axial · 0.68mm/px · z∈[-657,-350]mm · 19 of 337 slices shown]
[im 15/337  lung]
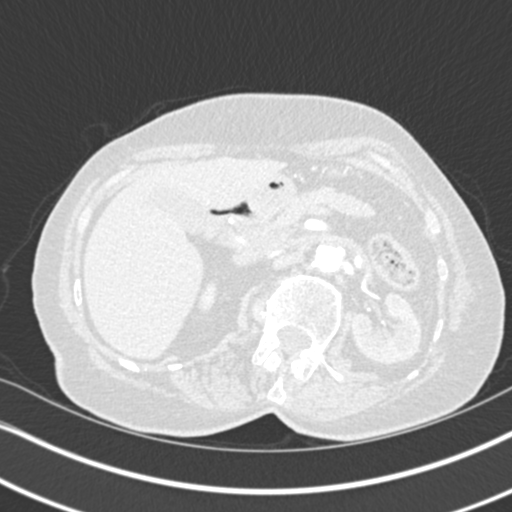
[im 30/337  soft-tissue]
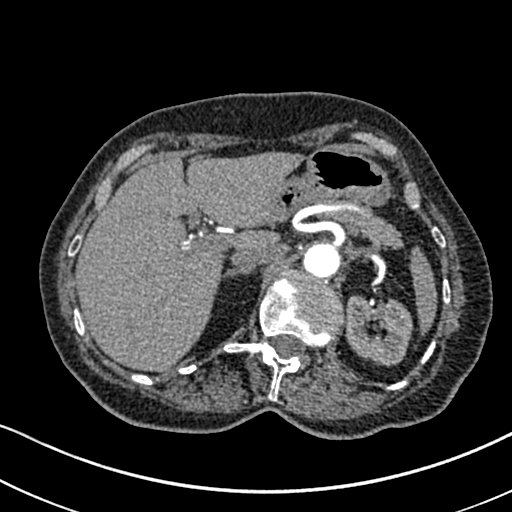
[im 44/337  lung]
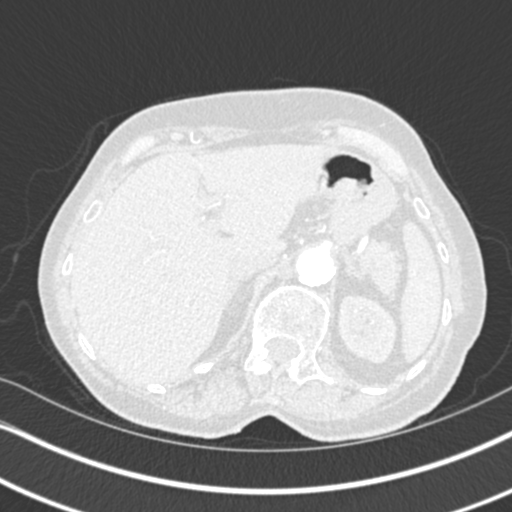
[im 74/337  soft-tissue]
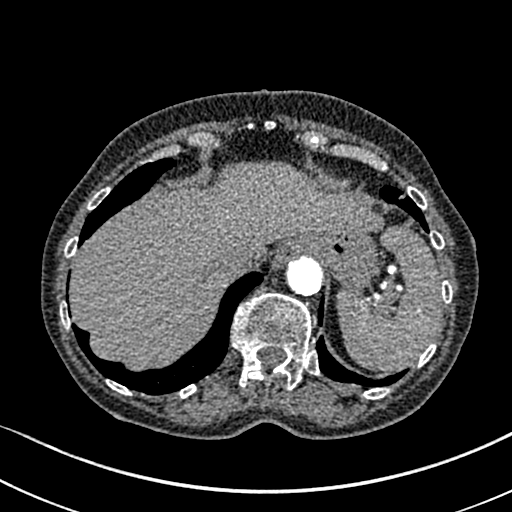
[im 88/337  lung]
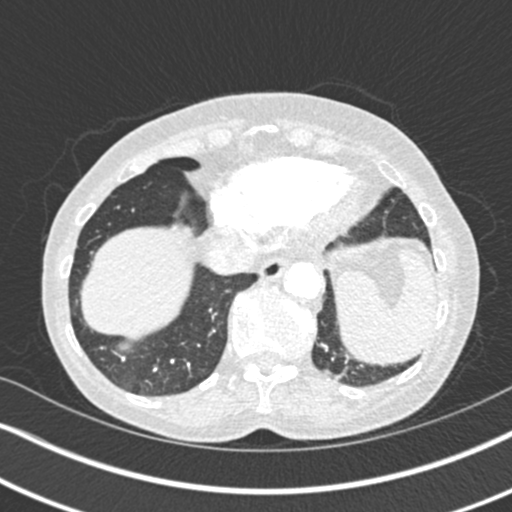
[im 103/337  soft-tissue]
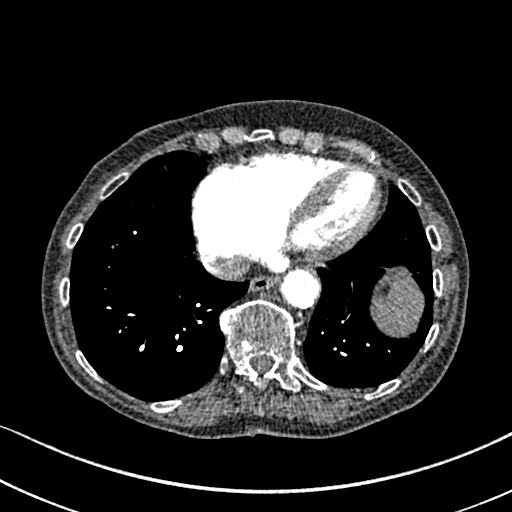
[im 117/337  lung]
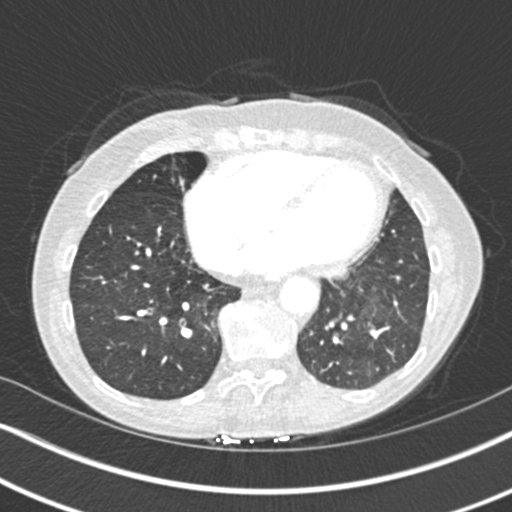
[im 132/337  soft-tissue]
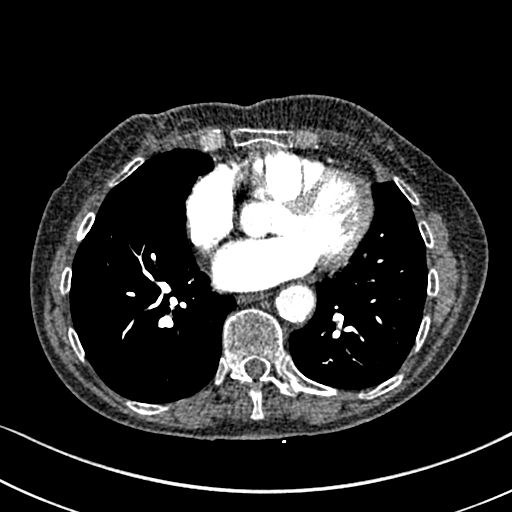
[im 147/337  lung]
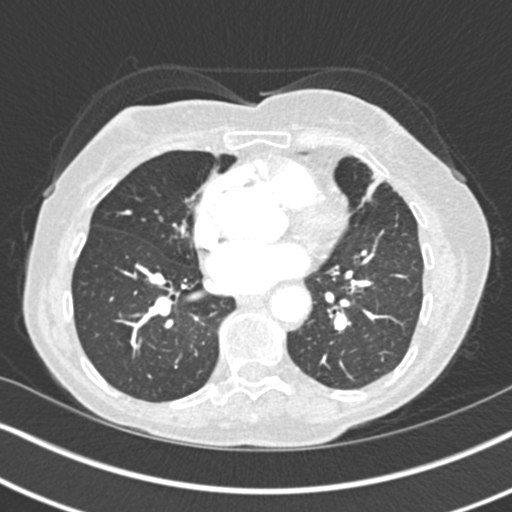
[im 176/337  soft-tissue]
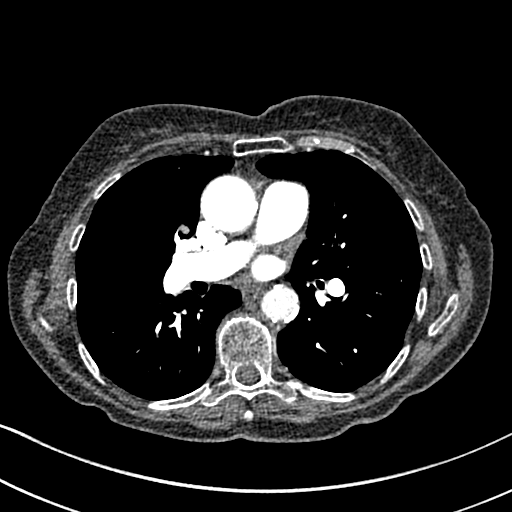
[im 190/337  lung]
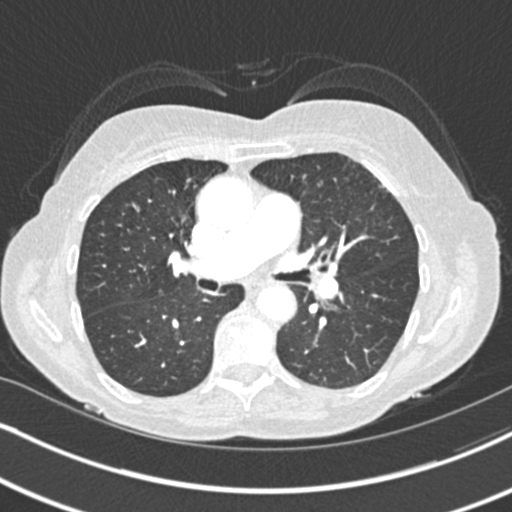
[im 205/337  soft-tissue]
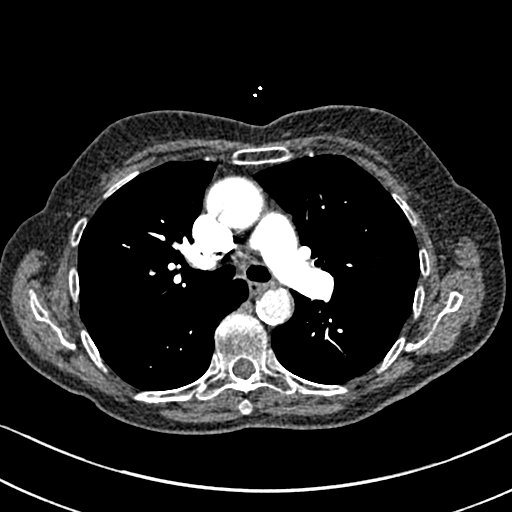
[im 220/337  lung]
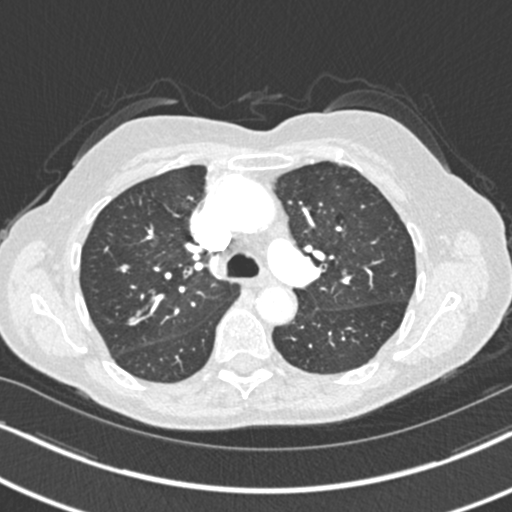
[im 234/337  soft-tissue]
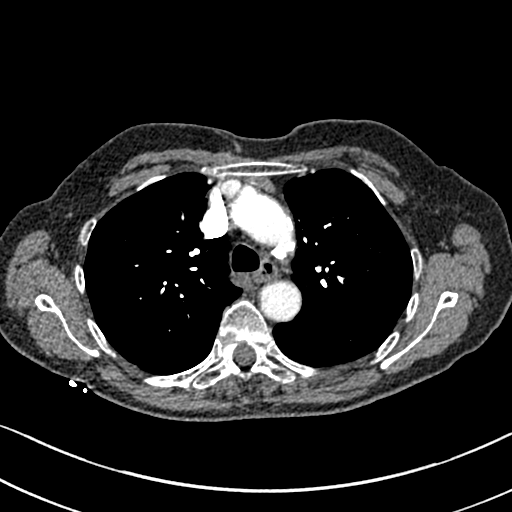
[im 249/337  lung]
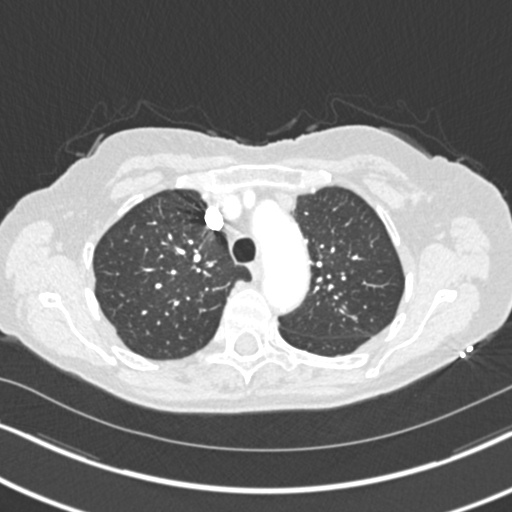
[im 263/337  soft-tissue]
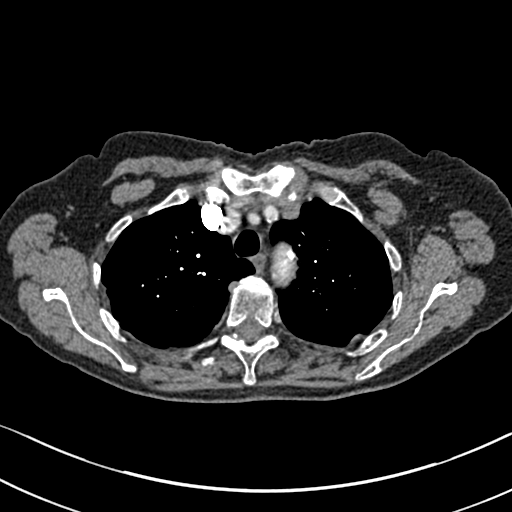
[im 293/337  lung]
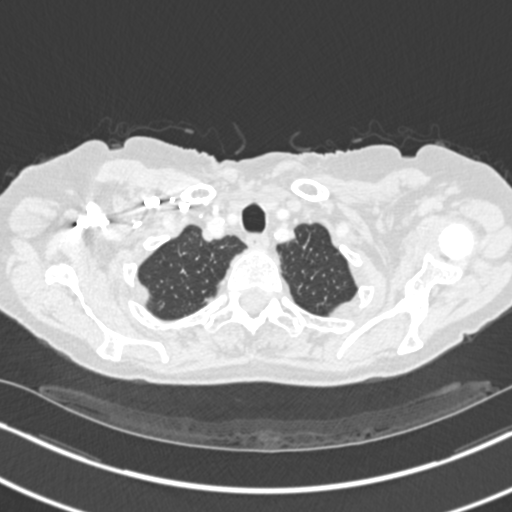
[im 307/337  soft-tissue]
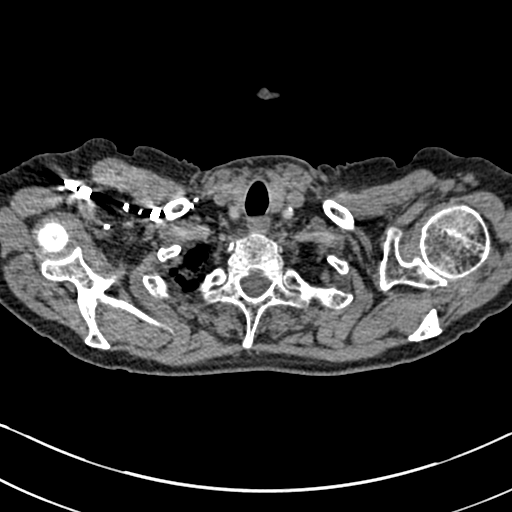
[im 322/337  lung]
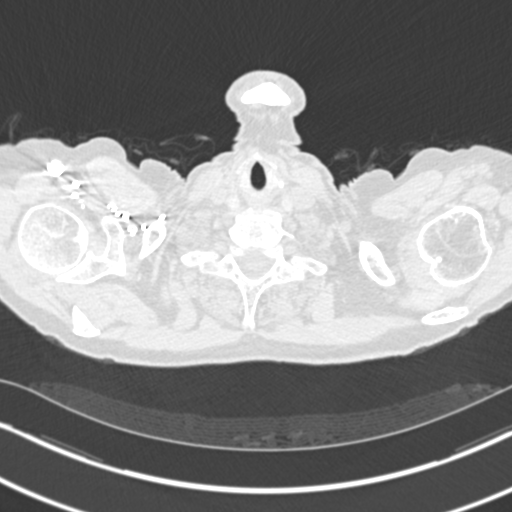

[19 of 32 positions shown; findings below may reference images not displayed]

FINDINGS: Cardiovascular: There is no demonstrable pulmonary embolus. There is
no thoracic aortic aneurysm or dissection. There is moderate
calcification in the proximal left subclavian artery. Visualized
great vessels otherwise appear unremarkable. There is moderate
atherosclerotic calcification in the aorta. There are foci of
coronary artery calcification. Pericardium is not appreciably
thickened.

Mediastinum/Nodes: Thyroid appears unremarkable. There is no
appreciable thoracic adenopathy.

Lungs/Pleura: There is bilateral apical scarring. There is mild
fibrotic change in the periphery of the left upper lobe and lingula.
There are scattered areas of mild atelectatic change. There is a
branching structure in the periphery of the superior segment right
lower lobe which does not enhance. This structure is felt to
represent a mucocele. It appears benign. There is no appreciable
edema or consolidation. No pleural effusion or pleural thickening
evident.

Upper Abdomen: In the visualized upper abdomen, there is aortic
atherosclerosis. Visualized upper abdominal structures otherwise
appear unremarkable.

Musculoskeletal: There is thoracolumbar levoscoliosis. There is
degenerative change in the thoracic spine. There are no blastic or
lytic bone lesions.

Review of the MIP images confirms the above findings.
IMPRESSION: No demonstrable pulmonary embolus. There are foci of atherosclerotic
calcification. No thoracic aortic aneurysm or dissection.

Probable mucocele in the periphery of the superior segment right
lower lobe. Areas of mild atelectatic change. Mild fibrosis in the
periphery of the left upper lobe and lingula. No frank edema or
consolidation.

No evident adenopathy.

## 2018-12-27 ENCOUNTER — Ambulatory Visit: Payer: Medicare Other | Admitting: General Surgery

## 2019-01-04 ENCOUNTER — Telehealth: Payer: Self-pay | Admitting: *Deleted

## 2019-01-04 NOTE — Telephone Encounter (Signed)
Patient called and said that she is aching all over.  Today she says that she hurts on her left breast the chest area and the ribs, hips, her knees her ankles and shoulders.  She has bad arthritis already but she just cannot take this pain.  If she should cut her tamoxifen in half or what other resolution you could come up for her

## 2019-01-04 NOTE — Telephone Encounter (Signed)
Hold tamoxifen for 4 weeks. If symptoms are better, she can take half the dose and see how she feels

## 2019-01-04 NOTE — Telephone Encounter (Signed)
Called patient back and let her know that Dr. Janese Banks wants her to stop the tamoxifen for 4 weeks.  If symptoms get much better or disappear she will then start the tamoxifen at 10 mg which will be half a pill for her every day.  We are changing her appointment that was originally on February 17 to March 9 at 11 am and see md 11:30.  I have instructed patient to give [place] a call back if she is at the third or fourth week and feels no better for being off of the tamoxifen from my stand point of pain.  And agreeable to new plan and she will call us at the end of her 3 to 4 weeks of being off the drug if she is not feeling any better

## 2019-01-15 ENCOUNTER — Ambulatory Visit: Payer: Medicare Other | Admitting: General Surgery

## 2019-01-21 ENCOUNTER — Other Ambulatory Visit: Payer: Medicare Other

## 2019-01-21 ENCOUNTER — Ambulatory Visit: Payer: Medicare Other | Admitting: Oncology

## 2019-01-29 ENCOUNTER — Other Ambulatory Visit: Payer: Self-pay

## 2019-01-29 ENCOUNTER — Ambulatory Visit (INDEPENDENT_AMBULATORY_CARE_PROVIDER_SITE_OTHER): Payer: Medicare Other | Admitting: General Surgery

## 2019-01-29 ENCOUNTER — Encounter: Payer: Self-pay | Admitting: General Surgery

## 2019-01-29 VITALS — BP 116/77 | HR 106 | Temp 96.6°F | Resp 18 | Ht 67.0 in | Wt 142.6 lb

## 2019-01-29 DIAGNOSIS — N6489 Other specified disorders of breast: Secondary | ICD-10-CM

## 2019-01-29 NOTE — Patient Instructions (Signed)
Return in three months.

## 2019-01-29 NOTE — Progress Notes (Signed)
Patient ID: Aimee Gutierrez, female   DOB: 06/18/1933, 85 y.o.   MRN: [id number]  Chief Complaint  Patient presents with  . Follow-up     left breast seroma    HPI Aimee Gutierrez is a 85 y.o. female here today for left breast seroma. Patient states she is feeling well.  HPI  Past Medical History:  Diagnosis Date  . Arthritis   . Breast cancer ([place]) 2013   left breast, radiation  . Breast cancer of lower-outer quadrant of right female breast ([place]) 03/09/2015   3.5 mm invasive carcinoma, low-grade DCIS. ER 90%, PR greater than 50%, HER-2/neu not amplified. Axillary sentinel node and radiation deferred based on age..  . Breast cancer, left ([place]) 03/26/2018   8 mm recurrent invasive mammary carcinoma, sentinel node negative.  ER/PR positive, HER-2/neu not overexpressed.  RpT1b rpN0(sn)   . Environmental allergies   . GERD (gastroesophageal reflux disease)   . Hypothyroidism   . IBS (irritable bowel syndrome)    in the past, diverticulitis in the past.  . Osteoporosis   . Personal history of radiation therapy 2016  . Personal history of radiation therapy 2013    Past Surgical History:  Procedure Laterality Date  . ABDOMINAL HYSTERECTOMY     1969  . BREAST BIOPSY Right 02/17/15   positive  . BREAST BIOPSY Left 03/13/2018   [place] core bx, INVASIVE MAMMARY CARCINOMA  . BREAST LUMPECTOMY Right 2016  . BREAST LUMPECTOMY Left 2013  . BREAST SURGERY Left 2013   [place] Right 03/09/15   wide excision  . CATARACT EXTRACTION  2013, 2015  . JOINT REPLACEMENT     right knee  . KNEE ARTHROPLASTY Left 07/04/2018   Procedure: COMPUTER ASSISTED TOTAL KNEE ARTHROPLASTY;  Surgeon: Dereck Leep, MD;  Location: [facility] ORS;  Service: Orthopedics;  Laterality: Left;  . OVARY SURGERY  2001   benign  . SENTINEL NODE BIOPSY Left 03/26/2018   Procedure: SENTINEL NODE BIOPSY;  Surgeon: Robert Bellow, MD;  Location: [facility] ORS;  Service: General;  Laterality: Left;  . SIMPLE  MASTECTOMY WITH AXILLARY SENTINEL NODE BIOPSY Left 03/26/2018   Procedure: SIMPLE MASTECTOMY;  Surgeon: Robert Bellow, MD;  Location: [facility] OR[facility];  Service: General;  Laterality: Left;  . TOTAL KNEE ARTHROPLASTY Right 10/12/2016   Procedure: TOTAL KNEE ARTHROPLASTY;  Surgeon: Dereck Leep, MD;  Location: [facility] OR[facility];  Service: Orthopedics;  Laterality: Right;  . VENTRAL HERNIA REPAIR N/A 02/01/2018   Procedure: HERNIA REPAIR VENTRAL ADULT;  Surgeon: Herbert Pun, MD;  Location: [facility] ORS;  Service: General;  Laterality: N/A;    Family History  Problem Relation Age of Onset  . Arthritis Mother   . Heart disease Father   . Alzheimer's disease Sister   . Breast cancer Paternal Grandmother     Social History Social History   Tobacco Use  . Smoking status: Former Smoker    Last attempt to quit: 12/05/2009    Years since quitting: 9.1  . Smokeless tobacco: Never Used  Substance Use Topics  . Alcohol use: No    Alcohol/week: 0.0 standard drinks    Comment: rare glass of wine  . Drug use: No    Allergies  Allergen Reactions  . Nitrofurantoin Nausea Only  . Latex Rash    Latex IgE was NEGATIVE (<0.10)  . Levothyroxine Nausea Only    Shaking, nausea, hypertension-can tolerate Synthroid  . Sulfa Antibiotics Rash    Current Outpatient Medications  Medication Sig Dispense Refill  . acetaminophen (TYLENOL) 500 MG tablet Take 500 mg by mouth 3 (three) times daily as needed for moderate pain (as needed).     Marland Kitchen aspirin EC 81 MG tablet Take by mouth.    Marland Kitchen azelastine (ASTELIN) 0.1 % nasal spray Place into the nose.    Marland Kitchen azithromycin (ZITHROMAX) 250 MG tablet     . benzonatate (TESSALON) 200 MG capsule Take by mouth.    . calcium carbonate (TUMS EX) 750 MG chewable tablet Chew 2 tablets by mouth at bedtime.     . Camphor-Eucalyptus-Menthol (VICKS VAPORUB EX) Apply 1 application topically daily as needed (applied to legs).    . Cholecalciferol (VITAMIN D) 2000 units tablet Take 4,000  Units by mouth at bedtime.     . Lactobacillus (FLORAJEN ACIDOPHILUS) CAPS Take 1 capsule by mouth every other day. At night    . omeprazole (PRILOSEC) 20 MG capsule Take 1 capsule (20 mg total) by mouth daily before breakfast. 90 capsule 0  . OVER THE COUNTER MEDICATION Place 1 application into both ears daily. EARGENE Soothing Ear Lotion--applied prior to placing hearing aids    . SYNTHROID 88 MCG tablet Take 88 mcg by mouth daily.    . tamoxifen (NOLVADEX) 20 MG tablet Take 1 tablet (20 mg total) by mouth daily. 30 tablet 3   No current facility-administered medications for this visit.     Review of Systems Review of Systems  Constitutional: Negative.   Respiratory: Negative.   Cardiovascular: Negative.     Blood pressure 116/77, pulse (!) 106, temperature (!) 96.6 F (35.9 C), temperature source Skin, resp. rate 18, height _0  (1.702 m), weight 142 lb 9.6 oz (64.7 kg), SpO2 96 %.  Physical Exam Physical Exam Exam conducted with a chaperone present.  Constitutional:      Appearance: She is well-developed.  Eyes:     General: No scleral icterus.    Conjunctiva/sclera: Conjunctivae normal.  Neck:     Musculoskeletal: Normal range of motion.  Cardiovascular:     Rate and Rhythm: Normal rate and regular rhythm.     Heart sounds: Normal heart sounds.  Pulmonary:     Effort: Pulmonary effort is normal.     Breath sounds: Normal breath sounds.  Chest:     Breasts: Breasts are symmetrical.        Right: No inverted nipple, mass, nipple discharge, skin change or tenderness.        Left: No inverted nipple, mass, nipple discharge, skin change or tenderness.    Lymphadenopathy:     Cervical: No cervical adenopathy.  Skin:    General: Skin is warm and dry.  Neurological:     Mental Status: She is alert and oriented to person, place, and time.      Assessment Doing well post left mastectomy.  Minimal residual seroma.  Plan Return in three months. The patient is aware  to call back for any questions or concerns.   HPI, Physical Exam, Assessment and Plan have been scribed under the direction and in the presence of Hervey Ard, Md.  Eudelia Bunch R. Bobette Mo, CMA  I have completed the exam and reviewed the above documentation for accuracy and completeness.  I agree with the above.  Haematologist has been used and any errors in dictation or transcription are unintentional.  Hervey Ard, M.D., F.A.C.S.  Aimee Gutierrez 01/30/2019, 1:44 PM

## 2019-02-11 ENCOUNTER — Other Ambulatory Visit: Payer: Self-pay

## 2019-02-11 ENCOUNTER — Inpatient Hospital Stay: Payer: Medicare Other | Attending: Oncology

## 2019-02-11 ENCOUNTER — Inpatient Hospital Stay (HOSPITAL_BASED_OUTPATIENT_CLINIC_OR_DEPARTMENT_OTHER): Payer: Medicare Other | Admitting: Oncology

## 2019-02-11 ENCOUNTER — Encounter: Payer: Self-pay | Admitting: Oncology

## 2019-02-11 VITALS — BP 124/79 | HR 75 | Temp 97.4°F | Resp 12 | Ht 67.0 in | Wt 144.3 lb

## 2019-02-11 DIAGNOSIS — E039 Hypothyroidism, unspecified: Secondary | ICD-10-CM | POA: Diagnosis not present

## 2019-02-11 DIAGNOSIS — Z87891 Personal history of nicotine dependence: Secondary | ICD-10-CM

## 2019-02-11 DIAGNOSIS — Z7982 Long term (current) use of aspirin: Secondary | ICD-10-CM | POA: Diagnosis not present

## 2019-02-11 DIAGNOSIS — Z923 Personal history of irradiation: Secondary | ICD-10-CM | POA: Diagnosis not present

## 2019-02-11 DIAGNOSIS — Z9012 Acquired absence of left breast and nipple: Secondary | ICD-10-CM

## 2019-02-11 DIAGNOSIS — Z79899 Other long term (current) drug therapy: Secondary | ICD-10-CM | POA: Diagnosis not present

## 2019-02-11 DIAGNOSIS — Z853 Personal history of malignant neoplasm of breast: Secondary | ICD-10-CM

## 2019-02-11 DIAGNOSIS — Z79810 Long term (current) use of selective estrogen receptor modulators (SERMs): Secondary | ICD-10-CM | POA: Diagnosis not present

## 2019-02-11 DIAGNOSIS — Z08 Encounter for follow-up examination after completed treatment for malignant neoplasm: Secondary | ICD-10-CM

## 2019-02-11 DIAGNOSIS — Z170 Estrogen receptor positive status [ER+]: Secondary | ICD-10-CM | POA: Diagnosis not present

## 2019-02-11 DIAGNOSIS — C50912 Malignant neoplasm of unspecified site of left female breast: Secondary | ICD-10-CM | POA: Insufficient documentation

## 2019-02-11 DIAGNOSIS — C50412 Malignant neoplasm of upper-outer quadrant of left female breast: Secondary | ICD-10-CM

## 2019-02-11 DIAGNOSIS — C50511 Malignant neoplasm of lower-outer quadrant of right female breast: Secondary | ICD-10-CM | POA: Insufficient documentation

## 2019-02-11 DIAGNOSIS — C50911 Malignant neoplasm of unspecified site of right female breast: Secondary | ICD-10-CM

## 2019-02-11 LAB — COMPREHENSIVE METABOLIC PANEL
ALT: 13 U/L (ref 0–44)
AST: 16 U/L (ref 15–41)
Albumin: 4 g/dL (ref 3.5–5.0)
Alkaline Phosphatase: 63 U/L (ref 38–126)
Anion gap: 6 (ref 5–15)
BUN: 17 mg/dL (ref 8–23)
CO2: 28 mmol/L (ref 22–32)
CREATININE: 0.87 mg/dL (ref 0.44–1.00)
Calcium: 9.1 mg/dL (ref 8.9–10.3)
Chloride: 103 mmol/L (ref 98–111)
GFR calc Af Amer: >60 mL/min (ref >60)
GFR calc non Af Amer: >60 mL/min (ref >60)
Glucose, Bld: 124 mg/dL — ABNORMAL HIGH (ref 70–99)
Potassium: 4.1 mmol/L (ref 3.5–5.1)
Sodium: 137 mmol/L (ref 135–145)
Total Bilirubin: 0.6 mg/dL (ref 0.3–1.2)
Total Protein: 7.4 g/dL (ref 6.5–8.1)

## 2019-02-11 NOTE — Progress Notes (Signed)
Patient here for follow up. She reports having pain in her left axilla since restarting Tamoxifin. She reduced her dose to 5 mg daily.

## 2019-02-11 NOTE — Progress Notes (Signed)
Hematology/Oncology Consult note [facility]  Telephone:([phone] [phone]  Patient Care Team: Adin Hector, MD as PCP - General (Internal Medicine) Lequita Asal, MD as Referring Physician (Hematology and Oncology) Bary Castilla Forest Gleason, MD (General Surgery)   Name of the patient: Aimee Gutierrez  [id number]  Aug 25, 1933   Date of visit: 02/11/19  Diagnosis-history of recurrent breast cancer  Chief complaint/ Reason for visit-routine follow-up of breast cancer on tamoxifen  Heme/Onc history: Patient is a 85 year old female who has had 3 prior breast cancers.  The first 1 was in April 2013 T1 a N0 M0 ER PR positive status post lumpectomy and radiation treatment.  Patient declined hormone therapy.  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  She had right breast cancer in April 2016 which showed 3.5 mm grade 2 invasive carcinoma with DCIS ER PR positive HER-2/neu negative completed radiation but declined hormone therapy.  In April 2019 she was found to have stage I left breast cancer 8 mm grade 2 ER PR positive HER-2/neu negative.  She underwent a left mastectomy with sentinel lymph node biopsy.  Did not require adjuvant radiation treatment but has not started any hormone therapy yet.  Patient was initially seen by Dr. Mike Gip.  She underwent a bone density scan which showed osteoporosis and therefore tamoxifen was preferred over AI   Interval history-patient reports significant pain in her bilateral knees extending up to her hips when she started taking tamoxifen 20 mg.  Also reported sharp pain in the region of her mastectomy site while she was on tamoxifen.  She did stop the 20 mg dose and started taking 5 mg dose daily over the last couple of weeks.  Reports that the symptoms are better but they have not completely gone away.  ECOG PS- 1 Pain scale- 0 Opioid  associated constipation- no  Review of systems- Review of Systems  Constitutional: Negative for chills, fever, malaise/fatigue and weight loss.  HENT: Negative for congestion, ear discharge and nosebleeds.   Eyes: Negative for blurred vision.  Respiratory: Negative for cough, hemoptysis, sputum production, shortness of breath and wheezing.   Cardiovascular: Negative for chest pain, palpitations, orthopnea and claudication.  Gastrointestinal: Negative for abdominal pain, blood in stool, constipation, diarrhea, heartburn, melena, nausea and vomiting.  Genitourinary: Negative for dysuria, flank pain, frequency, hematuria and urgency.  Musculoskeletal: Positive for joint pain. Negative for back pain and myalgias.  Skin: Negative for rash.  Neurological: Negative for dizziness, tingling, focal weakness, seizures, weakness and headaches.  Endo/Heme/Allergies: Does not bruise/bleed easily.  Psychiatric/Behavioral: Negative for depression and suicidal ideas. The patient does not have insomnia.       Allergies  Allergen Reactions  . Nitrofurantoin Nausea Only  . Latex Rash    Latex IgE was NEGATIVE (<0.10)  . Levothyroxine Nausea Only    Shaking, nausea, hypertension-can tolerate Synthroid  . Sulfa Antibiotics Rash     Past Medical History:  Diagnosis Date  . Arthritis   . Breast cancer ([place]) 2013   left breast, radiation  . Breast cancer of lower-outer quadrant of right female breast ([place]) 03/09/2015   3.5 mm invasive carcinoma, low-grade DCIS. ER 90%, PR greater than 50%, HER-2/neu not amplified. Axillary sentinel node and radiation deferred based on age..  . Breast cancer, left ([place]) 03/26/2018   8 mm recurrent invasive mammary carcinoma, sentinel node negative.  ER/PR positive, HER-2/neu  not overexpressed.  RpT1b rpN0(sn)   . Environmental allergies   . GERD (gastroesophageal reflux disease)   . Hypothyroidism   . IBS (irritable bowel syndrome)    in the past, diverticulitis in  the past.  . Osteoporosis   . Personal history of radiation therapy 2016  . Personal history of radiation therapy 2013     Past Surgical History:  Procedure Laterality Date  . ABDOMINAL HYSTERECTOMY     1969  . BREAST BIOPSY Right 02/17/15   positive  . BREAST BIOPSY Left 03/13/2018   [place] core bx, INVASIVE MAMMARY CARCINOMA  . BREAST LUMPECTOMY Right 2016  . BREAST LUMPECTOMY Left 2013  . BREAST SURGERY Left 2013   [place] Right 03/09/15   wide excision  . CATARACT EXTRACTION  2013, 2015  . JOINT REPLACEMENT     right knee  . KNEE ARTHROPLASTY Left 07/04/2018   Procedure: COMPUTER ASSISTED TOTAL KNEE ARTHROPLASTY;  Surgeon: Dereck Leep, MD;  Location: [facility] ORS;  Service: Orthopedics;  Laterality: Left;  . OVARY SURGERY  2001   benign  . SENTINEL NODE BIOPSY Left 03/26/2018   Procedure: SENTINEL NODE BIOPSY;  Surgeon: Robert Bellow, MD;  Location: [facility] ORS;  Service: General;  Laterality: Left;  . SIMPLE MASTECTOMY WITH AXILLARY SENTINEL NODE BIOPSY Left 03/26/2018   Procedure: SIMPLE MASTECTOMY;  Surgeon: Robert Bellow, MD;  Location: [facility] OR[facility];  Service: General;  Laterality: Left;  . TOTAL KNEE ARTHROPLASTY Right 10/12/2016   Procedure: TOTAL KNEE ARTHROPLASTY;  Surgeon: Dereck Leep, MD;  Location: [facility] ORS;  Service: Orthopedics;  Laterality: Right;  . VENTRAL HERNIA REPAIR N/A 02/01/2018   Procedure: HERNIA REPAIR VENTRAL ADULT;  Surgeon: Herbert Pun, MD;  Location: [facility] OR[facility];  Service: General;  Laterality: N/A;    Social History   Socioeconomic History  . Marital status: Widowed    Spouse name: Not on file  . Number of children: Not on file  . Years of education: Not on file  . Highest education level: Not on file  Occupational History  . Not on file  Social Needs  . Financial resource strain: Not on file  . Food insecurity:    Worry: Not on file    Inability: Not on file  . Transportation needs:    Medical: Not on  file    Non-medical: Not on file  Tobacco Use  . Smoking status: Former Smoker    Last attempt to quit: 12/05/2009    Years since quitting: 9.1  . Smokeless tobacco: Never Used  Substance and Sexual Activity  . Alcohol use: No    Alcohol/week: 0.0 standard drinks    Comment: rare glass of wine  . Drug use: No  . Sexual activity: Never  Lifestyle  . Physical activity:    Days per week: Not on file    Minutes per session: Not on file  . Stress: Not on file  Relationships  . Social connections:    Talks on phone: Not on file    Gets together: Not on file    Attends religious service: Not on file    Active member of club or organization: Not on file    Attends meetings of clubs or organizations: Not on file    Relationship status: Not on file  . Intimate partner violence:    Fear of current or ex partner: Not on file    Emotionally abused: Not on file    Physically abused:  Not on file    Forced sexual activity: Not on file  Other Topics Concern  . Not on file  Social History Narrative  . Not on file    Family History  Problem Relation Age of Onset  . Arthritis Mother   . Heart disease Father   . Alzheimer's disease Sister   . Breast cancer Paternal Grandmother      Current Outpatient Medications:  .  acetaminophen (TYLENOL) 500 MG tablet, Take 500 mg by mouth 3 (three) times daily as needed for moderate pain (as needed). , Disp: , Rfl:  .  aspirin EC 81 MG tablet, Take by mouth., Disp: , Rfl:  .  azelastine (ASTELIN) 0.1 % nasal spray, Place into the nose., Disp: , Rfl:  .  benzonatate (TESSALON) 200 MG capsule, Take by mouth., Disp: , Rfl:  .  calcium carbonate (TUMS EX) 750 MG chewable tablet, Chew 2 tablets by mouth at bedtime. , Disp: , Rfl:  .  Camphor-Eucalyptus-Menthol (VICKS VAPORUB EX), Apply 1 application topically daily as needed (applied to legs)., Disp: , Rfl:  .  Cholecalciferol (VITAMIN D) 2000 units tablet, Take 4,000 Units by mouth at bedtime. , Disp: ,  Rfl:  .  Lactobacillus (FLORAJEN ACIDOPHILUS) CAPS, Take 1 capsule by mouth every other day. At night, Disp: , Rfl:  .  omeprazole (PRILOSEC) 20 MG capsule, Take 1 capsule (20 mg total) by mouth daily before breakfast., Disp: 90 capsule, Rfl: 0 .  OVER THE COUNTER MEDICATION, Place 1 application into both ears daily. EARGENE Soothing Ear Lotion--applied prior to placing hearing aids, Disp: , Rfl:  .  SYNTHROID 88 MCG tablet, Take 88 mcg by mouth daily., Disp: , Rfl:  .  tamoxifen (NOLVADEX) 20 MG tablet, Take 1 tablet (20 mg total) by mouth daily., Disp: 30 tablet, Rfl: 3  Physical exam:  Vitals:   02/11/19 1103 02/11/19 1112  BP:  124/79  Pulse:  75  Resp: 12   Temp:  (!) 97.4 F (36.3 C)  TempSrc:  Tympanic  Weight: 144 lb 4.8 oz (65.5 kg)   Height: _0  (1.702 m)    Physical Exam HENT:     Head: Normocephalic and atraumatic.  Eyes:     Pupils: Pupils are equal, round, and reactive to light.  Neck:     Musculoskeletal: Normal range of motion.  Cardiovascular:     Rate and Rhythm: Normal rate and regular rhythm.     Heart sounds: Normal heart sounds.  Pulmonary:     Effort: Pulmonary effort is normal.     Breath sounds: Normal breath sounds.  Abdominal:     General: Bowel sounds are normal.     Palpations: Abdomen is soft.  Skin:    General: Skin is warm and dry.  Neurological:     Mental Status: She is alert and oriented to person, place, and time.   Patient is s/p left mastectomy without reconstruction with a well-healed surgical scar.  No evidence of chest wall recurrence.  No palpable left axillary adenopathy.  CMP Latest Ref Rng & Units 02/11/2019  Glucose 70 - 99 mg/dL 124(H)  BUN 8 - 23 mg/dL 17  Creatinine 0.44 - 1.00 mg/dL 0.87  Sodium 135 - 145 mmol/L 137  Potassium 3.5 - 5.1 mmol/L 4.1  Chloride 98 - 111 mmol/L 103  CO2 22 - 32 mmol/L 28  Calcium 8.9 - 10.3 mg/dL 9.1  Total Protein 6.5 - 8.1 g/dL 7.4  Total Bilirubin 0.3 - 1.2  mg/dL 0.6  Alkaline Phos  38 - 126 U/L 63  AST 15 - 41 U/L 16  ALT 0 - 44 U/L 13   CBC Latest Ref Rng & Units 12/10/2018  WBC 4.0 - 10.5 K/uL 5.0  Hemoglobin 12.0 - 15.0 g/dL 13.3  Hematocrit 36.0 - 46.0 % 42.5  Platelets 150 - 400 K/uL 162      Assessment and plan- Patient is a 85 y.o. female with h/o 3 recurrent breast cancer most recent one in the left breast in April 2019 ER PR positive.  She is here for routine follow-up of breast cancer on tamoxifen  Patient could not tolerate 20 mg daily of tamoxifen because of joint pains and worsening pain at her mastectomy site.  She is currently on 5 mg daily dose and reports that the symptoms are better but have not completely gone away.  She is not sure if she will be able to continue to take 5 mg of tamoxifen daily if the symptoms persist.  Discussed that 5 mg low-dose tamoxifen has been studied in DCIS but may not particularly apply to her case and recurrent breast cancer with invasive disease.  However given that she has had recurrent breast cancer it would be prudent for her to stay on some endocrine therapy for breast cancer rather than none at all.  I have therefore asked her to continue 5 mg daily tamoxifen to see if she is able to tolerate it.  If she decides to stop taking tamoxifen she will call us.  I will tentatively see her back in 4 months time without labs  Discussed that postmastectomy pain is likely neuropathic and can persist for months to years following mastectomy.   Visit Diagnosis 1. Long-term current use of tamoxifen   2. Encounter for follow-up surveillance of breast cancer      Dr. Randa Evens, MD, MPH [facility] at [facility] [phone] 02/11/2019 3:31 PM

## 2019-03-05 ENCOUNTER — Other Ambulatory Visit: Payer: Medicare Other

## 2019-03-07 ENCOUNTER — Other Ambulatory Visit: Payer: Medicare Other

## 2019-04-18 ENCOUNTER — Other Ambulatory Visit: Payer: Medicare Other

## 2019-04-22 ENCOUNTER — Ambulatory Visit
Admission: RE | Admit: 2019-04-22 | Discharge: 2019-04-22 | Disposition: A | Discharge status: Home / Self Care | Payer: Medicare Other | Source: Ambulatory Visit | Attending: Oncology | Admitting: Oncology

## 2019-04-22 ENCOUNTER — Other Ambulatory Visit: Payer: Self-pay

## 2019-04-22 DIAGNOSIS — Z853 Personal history of malignant neoplasm of breast: Secondary | ICD-10-CM | POA: Insufficient documentation

## 2019-04-23 ENCOUNTER — Other Ambulatory Visit: Payer: Medicare Other

## 2019-04-30 ENCOUNTER — Ambulatory Visit: Payer: Medicare Other | Admitting: General Surgery

## 2019-05-02 ENCOUNTER — Ambulatory Visit: Payer: Medicare Other | Admitting: General Surgery

## 2019-05-07 ENCOUNTER — Other Ambulatory Visit: Payer: Self-pay

## 2019-05-07 ENCOUNTER — Ambulatory Visit (INDEPENDENT_AMBULATORY_CARE_PROVIDER_SITE_OTHER): Payer: Medicare Other | Admitting: General Surgery

## 2019-05-07 ENCOUNTER — Encounter: Payer: Self-pay | Admitting: General Surgery

## 2019-05-07 VITALS — BP 148/84 | HR 87 | Temp 97.7°F | Ht 67.0 in | Wt 146.0 lb

## 2019-05-07 DIAGNOSIS — Z853 Personal history of malignant neoplasm of breast: Secondary | ICD-10-CM

## 2019-05-07 NOTE — Patient Instructions (Signed)
The patient is aware to call back for any questions or new concerns. The patient has been asked to return to the office in one year with a unilateral right breast diagnostic mammogram.

## 2019-05-07 NOTE — Progress Notes (Signed)
Patient ID: Aimee Gutierrez, female   DOB: 1933-04-30, 85 y.o.   MRN: [id number]  Chief Complaint  Patient presents with  . Follow-up    HPI Aimee Gutierrez is a 85 y.o. female.  Here for follow up bilateral breast cancer and right mammogram was 04-22-19. She stopped the tamoxifen in April because of body aches. She states she has shortness of breath which is not new, but her shortness of breath is happening more often.  She reports being able to walk up a flight of stairs without difficulty.  Occasionally she will feel transiently short winded while at rest.  She has not been aware of any palpitations during these episodes.  The patient reports that her PCP,  Dr Caryl Comes is aware.  HPI  Past Medical History:  Diagnosis Date  . Arthritis   . Breast cancer ([place]) 2013   left breast, radiation  . Breast cancer of lower-outer quadrant of right female breast ([place]) 03/09/2015   3.5 mm invasive carcinoma, low-grade DCIS. ER 90%, PR greater than 50%, HER-2/neu not amplified. Axillary sentinel node and radiation deferred based on age..  . Breast cancer, left ([place]) 03/26/2018   8 mm recurrent invasive mammary carcinoma, sentinel node negative.  ER/PR positive, HER-2/neu not overexpressed.  RpT1b rpN0(sn)   . Environmental allergies   . GERD (gastroesophageal reflux disease)   . Hypothyroidism   . IBS (irritable bowel syndrome)    in the past, diverticulitis in the past.  . Osteoporosis   . Personal history of radiation therapy 2016  . Personal history of radiation therapy 2013    Past Surgical History:  Procedure Laterality Date  . ABDOMINAL HYSTERECTOMY     1969  . BREAST BIOPSY Right 02/17/15   positive  . BREAST BIOPSY Left 03/13/2018   [place] core bx, INVASIVE MAMMARY CARCINOMA  . BREAST LUMPECTOMY Right 2016  . BREAST LUMPECTOMY Left 2013  . BREAST SURGERY Left 2013   [place] Right 03/09/15   wide excision  . CATARACT EXTRACTION  2013, 2015  . JOINT REPLACEMENT      right knee  . KNEE ARTHROPLASTY Left 07/04/2018   Procedure: COMPUTER ASSISTED TOTAL KNEE ARTHROPLASTY;  Surgeon: Dereck Leep, MD;  Location: [facility] OR[facility];  Service: Orthopedics;  Laterality: Left;  Marland Kitchen MASTECTOMY Left 2019   INVASIVE MAMMARY CARCINOMA   . OVARY SURGERY  2001   benign  . SENTINEL NODE BIOPSY Left 03/26/2018   Procedure: SENTINEL NODE BIOPSY;  Surgeon: Robert Bellow, MD;  Location: [facility] ORS;  Service: General;  Laterality: Left;  . SIMPLE MASTECTOMY WITH AXILLARY SENTINEL NODE BIOPSY Left 03/26/2018   Procedure: SIMPLE MASTECTOMY;  Surgeon: Robert Bellow, MD;  Location: [facility] OR[facility];  Service: General;  Laterality: Left;  . TOTAL KNEE ARTHROPLASTY Right 10/12/2016   Procedure: TOTAL KNEE ARTHROPLASTY;  Surgeon: Dereck Leep, MD;  Location: [facility] OR[facility];  Service: Orthopedics;  Laterality: Right;  . VENTRAL HERNIA REPAIR N/A 02/01/2018   Procedure: HERNIA REPAIR VENTRAL ADULT;  Surgeon: Herbert Pun, MD;  Location: [facility] OR[facility];  Service: General;  Laterality: N/A;    Family History  Problem Relation Age of Onset  . Arthritis Mother   . Heart disease Father   . Alzheimer's disease Sister   . Breast cancer Paternal Grandmother     Social History Social History   Tobacco Use  . Smoking status: Former Smoker    Last attempt to quit: 12/05/2009    Years since quitting:  9.4  . Smokeless tobacco: Never Used  Substance Use Topics  . Alcohol use: No    Alcohol/week: 0.0 standard drinks    Comment: rare glass of wine  . Drug use: No    Allergies  Allergen Reactions  . Nitrofurantoin Nausea Only  . Latex Rash    Latex IgE was NEGATIVE (<0.10)  . Levothyroxine Nausea Only    Shaking, nausea, hypertension-can tolerate Synthroid  . Sulfa Antibiotics Rash    Current Outpatient Medications  Medication Sig Dispense Refill  . acetaminophen (TYLENOL) 500 MG tablet Take 500 mg by mouth 3 (three) times daily as needed for moderate pain (as needed).     Marland Kitchen aspirin EC 81  MG tablet Take by mouth.    Marland Kitchen azelastine (ASTELIN) 0.1 % nasal spray Place into the nose.    . benzonatate (TESSALON) 200 MG capsule Take by mouth.    . calcium carbonate (TUMS EX) 750 MG chewable tablet Chew 2 tablets by mouth at bedtime.     . Camphor-Eucalyptus-Menthol (VICKS VAPORUB EX) Apply 1 application topically daily as needed (applied to legs).    . Cholecalciferol (VITAMIN D) 2000 units tablet Take 4,000 Units by mouth at bedtime.     . Lactobacillus (FLORAJEN ACIDOPHILUS) CAPS Take 1 capsule by mouth every other day. At night    . omeprazole (PRILOSEC) 20 MG capsule Take 1 capsule (20 mg total) by mouth daily before breakfast. 90 capsule 0  . OVER THE COUNTER MEDICATION Place 1 application into both ears daily. EARGENE Soothing Ear Lotion--applied prior to placing hearing aids    . SYNTHROID 88 MCG tablet Take 88 mcg by mouth daily.    . tamoxifen (NOLVADEX) 20 MG tablet Take 1 tablet (20 mg total) by mouth daily. 30 tablet 3   No current facility-administered medications for this visit.     Review of Systems Review of Systems  Constitutional: Negative.   Respiratory: Positive for shortness of breath.   Cardiovascular: Negative.     Blood pressure (!) 148/84, pulse 87, temperature 97.7 F (36.5 C), temperature source Temporal, height '5\' 7"'$  (1.702 m), weight 146 lb (66.2 kg), SpO2 97 %.  Physical Exam Physical Exam Exam conducted with a chaperone present.  Constitutional:      Appearance: She is well-developed.  Eyes:     General: No scleral icterus.    Conjunctiva/sclera: Conjunctivae normal.  Neck:     Musculoskeletal: Neck supple.  Cardiovascular:     Rate and Rhythm: Normal rate and regular rhythm.     Heart sounds: Normal heart sounds.  Pulmonary:     Effort: Pulmonary effort is normal.     Breath sounds: Normal breath sounds. No decreased air movement. No decreased breath sounds, wheezing or rhonchi.  Chest:     Breasts:        Right: Inverted nipple  present. No mass, nipple discharge, skin change or tenderness.        Comments: Inverted right nipple   Lymphadenopathy:     Cervical: No cervical adenopathy.  Skin:    General: Skin is warm and dry.  Neurological:     Mental Status: She is alert and oriented to person, place, and time.  Psychiatric:        Behavior: Behavior normal.     Data Reviewed Right breast diagnostic mammogram of Apr 22, 2019 was independently reviewed.  BI-RADS-2.  Assessment Doing well post left salvage mastectomy.  Plan   The patient is aware to call back  for any questions or new concerns. The patient has been asked to return to the office in one year with a unilateral right breast diagnostic mammogram.   HPI, assessment, plan and physical exam has been scribed under the direction and in the presence of Robert Bellow, MD. Karie Fetch, RN  I have completed the exam and reviewed the above documentation for accuracy and completeness.  I agree with the above.  Haematologist has been used and any errors in dictation or transcription are unintentional.  Hervey Ard, M.D., F.A.C.S.  [place]st Gleason Lisandra Mathisen 05/07/2019, 9:18 PM

## 2019-06-14 ENCOUNTER — Inpatient Hospital Stay: Payer: Medicare Other | Attending: Oncology | Admitting: Oncology

## 2019-06-14 ENCOUNTER — Other Ambulatory Visit: Payer: Self-pay

## 2019-06-14 VITALS — BP 122/73 | HR 73 | Temp 97.1°F | Resp 16 | Wt 141.9 lb

## 2019-06-14 DIAGNOSIS — Z923 Personal history of irradiation: Secondary | ICD-10-CM | POA: Diagnosis not present

## 2019-06-14 DIAGNOSIS — E039 Hypothyroidism, unspecified: Secondary | ICD-10-CM | POA: Insufficient documentation

## 2019-06-14 DIAGNOSIS — Z803 Family history of malignant neoplasm of breast: Secondary | ICD-10-CM | POA: Insufficient documentation

## 2019-06-14 DIAGNOSIS — Z79899 Other long term (current) drug therapy: Secondary | ICD-10-CM | POA: Diagnosis not present

## 2019-06-14 DIAGNOSIS — Z87891 Personal history of nicotine dependence: Secondary | ICD-10-CM | POA: Insufficient documentation

## 2019-06-14 DIAGNOSIS — C50511 Malignant neoplasm of lower-outer quadrant of right female breast: Secondary | ICD-10-CM | POA: Diagnosis not present

## 2019-06-14 DIAGNOSIS — Z7982 Long term (current) use of aspirin: Secondary | ICD-10-CM | POA: Insufficient documentation

## 2019-06-14 DIAGNOSIS — Z9012 Acquired absence of left breast and nipple: Secondary | ICD-10-CM | POA: Insufficient documentation

## 2019-06-14 DIAGNOSIS — Z08 Encounter for follow-up examination after completed treatment for malignant neoplasm: Secondary | ICD-10-CM

## 2019-06-14 DIAGNOSIS — C50912 Malignant neoplasm of unspecified site of left female breast: Secondary | ICD-10-CM | POA: Diagnosis present

## 2019-06-14 DIAGNOSIS — Z170 Estrogen receptor positive status [ER+]: Secondary | ICD-10-CM | POA: Diagnosis not present

## 2019-06-14 NOTE — Progress Notes (Signed)
Patient is here for follow up, she mentions a rash around navel area

## 2019-06-17 ENCOUNTER — Encounter: Payer: Self-pay | Admitting: Oncology

## 2019-06-17 NOTE — Progress Notes (Signed)
Hematology/Oncology Consult note [facility]  [phone] [phone]  Patient Care Team: Adin Hector, MD as PCP - General (Internal Medicine) Lequita Asal, MD as Referring Physician (Hematology and Oncology) Bary Castilla Forest Gleason, MD (General Surgery)   Name of the patient: Aimee Gutierrez  [id number]  Jan 30, 1933   Date of visit: 06/17/19  Diagnosis- history of recurrent breast cancer   Chief complaint/ Reason for visit-routine follow-up of breast cancer  Heme/Onc history: Patient is a 85 year old female who has had 3 prior breast cancers. The first 1 was in April 2013 T1 a N0 M0 ER PR positive status post lumpectomy and radiation treatment. Patient declined hormone therapy. 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 She had rightbreast cancer in April 2016 which showed 3.5 mm grade 2 invasive carcinoma with DCIS ER PR positive HER-2/neu negative completed radiation but declined hormone therapy. In April 2019 she was found to have stage I left breast cancer 8 mm grade 2 ER PR positive HER-2/neu negative. She underwent a left mastectomy with sentinel lymph node biopsy. Did not require adjuvant radiation treatment but has not started any hormone therapy yet.  Patient was initially seen by Dr. Mike Gip. She underwent a bone density scan which showed osteoporosis and therefore tamoxifen was preferred over AI.  Patient ultimately decided not to pursue any hormone therapy and is currently off tamoxifen and AI.   Interval history-patient reports feeling well overall since she stopped taking tamoxifen.  Reports that her mood and energy levels are better.  She has noticed rash around her umbilicus  ECOG PS- 1 Pain scale- 0 Opioid associated constipation- no  Review of systems- Review of Systems  Constitutional: Negative for chills, fever, malaise/fatigue  and weight loss.  HENT: Negative for congestion, ear discharge and nosebleeds.   Eyes: Negative for blurred vision.  Respiratory: Negative for cough, hemoptysis, sputum production, shortness of breath and wheezing.   Cardiovascular: Negative for chest pain, palpitations, orthopnea and claudication.  Gastrointestinal: Negative for abdominal pain, blood in stool, constipation, diarrhea, heartburn, melena, nausea and vomiting.  Genitourinary: Negative for dysuria, flank pain, frequency, hematuria and urgency.  Musculoskeletal: Negative for back pain, joint pain and myalgias.  Skin: Positive for rash.  Neurological: Negative for dizziness, tingling, focal weakness, seizures, weakness and headaches.  Endo/Heme/Allergies: Does not bruise/bleed easily.  Psychiatric/Behavioral: Negative for depression and suicidal ideas. The patient does not have insomnia.        Allergies  Allergen Reactions  . Nitrofurantoin Nausea Only  . Latex Rash    Latex IgE was NEGATIVE (<0.10)  . Levothyroxine Nausea Only    Shaking, nausea, hypertension-can tolerate Synthroid  . Sulfa Antibiotics Rash     Past Medical History:  Diagnosis Date  . Arthritis   . Breast cancer ([place]) 2013   left breast, radiation  . Breast cancer of lower-outer quadrant of right female breast ([place]) 03/09/2015   3.5 mm invasive carcinoma, low-grade DCIS. ER 90%, PR greater than 50%, HER-2/neu not amplified. Axillary sentinel node and radiation deferred based on age..  . Breast cancer, left ([place]) 03/26/2018   8 mm recurrent invasive mammary carcinoma, sentinel node negative.  ER/PR positive, HER-2/neu not overexpressed.  RpT1b rpN0(sn)   . Environmental allergies   . GERD (gastroesophageal reflux disease)   . Hypothyroidism   . IBS (irritable bowel syndrome)    in the past, diverticulitis in the  past.  . Osteoporosis   . Personal history of radiation therapy 2016  . Personal history of radiation therapy 2013     Past  Surgical History:  Procedure Laterality Date  . ABDOMINAL HYSTERECTOMY     1969  . BREAST BIOPSY Right 02/17/15   positive  . BREAST BIOPSY Left 03/13/2018   [place] core bx, INVASIVE MAMMARY CARCINOMA  . BREAST LUMPECTOMY Right 2016  . BREAST LUMPECTOMY Left 2013  . BREAST SURGERY Left 2013   [place] Right 03/09/15   wide excision  . CATARACT EXTRACTION  2013, 2015  . JOINT REPLACEMENT     right knee  . KNEE ARTHROPLASTY Left 07/04/2018   Procedure: COMPUTER ASSISTED TOTAL KNEE ARTHROPLASTY;  Surgeon: Dereck Leep, MD;  Location: [facility] ORS;  Service: Orthopedics;  Laterality: Left;  Marland Kitchen MASTECTOMY Left 2019   INVASIVE MAMMARY CARCINOMA   . OVARY SURGERY  2001   benign  . SENTINEL NODE BIOPSY Left 03/26/2018   Procedure: SENTINEL NODE BIOPSY;  Surgeon: Robert Bellow, MD;  Location: [facility] ORS;  Service: General;  Laterality: Left;  . SIMPLE MASTECTOMY WITH AXILLARY SENTINEL NODE BIOPSY Left 03/26/2018   Procedure: SIMPLE MASTECTOMY;  Surgeon: Robert Bellow, MD;  Location: [facility] ORS;  Service: General;  Laterality: Left;  . TOTAL KNEE ARTHROPLASTY Right 10/12/2016   Procedure: TOTAL KNEE ARTHROPLASTY;  Surgeon: Dereck Leep, MD;  Location: [facility] ORS;  Service: Orthopedics;  Laterality: Right;  . VENTRAL HERNIA REPAIR N/A 02/01/2018   Procedure: HERNIA REPAIR VENTRAL ADULT;  Surgeon: Herbert Pun, MD;  Location: [facility] ORS;  Service: General;  Laterality: N/A;    Social History   Socioeconomic History  . Marital status: Widowed    Spouse name: Not on file  . Number of children: Not on file  . Years of education: Not on file  . Highest education level: Not on file  Occupational History  . Not on file  Social Needs  . Financial resource strain: Not on file  . Food insecurity    Worry: Not on file    Inability: Not on file  . Transportation needs    Medical: Not on file    Non-medical: Not on file  Tobacco Use  . Smoking status: Former Smoker     Quit date: 12/05/2009    Years since quitting: 9.5  . Smokeless tobacco: Never Used  Substance and Sexual Activity  . Alcohol use: No    Alcohol/week: 0.0 standard drinks    Comment: rare glass of wine  . Drug use: No  . Sexual activity: Never  Lifestyle  . Physical activity    Days per week: Not on file    Minutes per session: Not on file  . Stress: Not on file  Relationships  . Social Herbalist on phone: Not on file    Gets together: Not on file    Attends religious service: Not on file    Active member of club or organization: Not on file    Attends meetings of clubs or organizations: Not on file    Relationship status: Not on file  . Intimate partner violence    Fear of current or ex partner: Not on file    Emotionally abused: Not on file    Physically abused: Not on file    Forced sexual activity: Not on file  Other Topics Concern  . Not on file  Social History Narrative  . Not  on file    Family History  Problem Relation Age of Onset  . Arthritis Mother   . Heart disease Father   . Alzheimer's disease Sister   . Breast cancer Paternal Grandmother      Current Outpatient Medications:  .  aspirin EC 81 MG tablet, Take by mouth., Disp: , Rfl:  .  calcium carbonate (TUMS EX) 750 MG chewable tablet, Chew 2 tablets by mouth at bedtime. , Disp: , Rfl:  .  Camphor-Eucalyptus-Menthol (VICKS VAPORUB EX), Apply 1 application topically daily as needed (applied to legs)., Disp: , Rfl:  .  Cholecalciferol (VITAMIN D) 2000 units tablet, Take 4,000 Units by mouth at bedtime. , Disp: , Rfl:  .  Docusate Sodium (COLACE PO), Take 1 capsule by mouth., Disp: , Rfl:  .  Lactobacillus (FLORAJEN ACIDOPHILUS) CAPS, Take 1 capsule by mouth every other day. At night, Disp: , Rfl:  .  OVER THE COUNTER MEDICATION, Place 1 application into both ears daily. EARGENE Soothing Ear Lotion--applied prior to placing hearing aids, Disp: , Rfl:  .  SYNTHROID 88 MCG tablet, Take 88 mcg by  mouth daily., Disp: , Rfl:  .  acetaminophen (TYLENOL) 500 MG tablet, Take 500 mg by mouth 3 (three) times daily as needed for moderate pain (as needed). , Disp: , Rfl:  .  azelastine (ASTELIN) 0.1 % nasal spray, Place into the nose., Disp: , Rfl:  .  benzonatate (TESSALON) 200 MG capsule, Take by mouth., Disp: , Rfl:  .  omeprazole (PRILOSEC) 20 MG capsule, Take 1 capsule (20 mg total) by mouth daily before breakfast. (Patient not taking: Reported on 06/14/2019), Disp: 90 capsule, Rfl: 0 .  tamoxifen (NOLVADEX) 20 MG tablet, Take 1 tablet (20 mg total) by mouth daily. (Patient not taking: Reported on 06/14/2019), Disp: 30 tablet, Rfl: 3  Physical exam:  Vitals:   06/14/19 1018  BP: 122/73  Pulse: 73  Resp: 16  Temp: (!) 97.1 F (36.2 C)  TempSrc: Tympanic  Weight: 141 lb 14.4 oz (64.4 kg)   Physical Exam Constitutional:      Comments: Elderly female in no acute distress  HENT:     Head: Normocephalic and atraumatic.  Eyes:     Pupils: Pupils are equal, round, and reactive to light.  Neck:     Musculoskeletal: Normal range of motion.  Cardiovascular:     Rate and Rhythm: Normal rate and regular rhythm.     Heart sounds: Normal heart sounds.  Pulmonary:     Effort: Pulmonary effort is normal.     Breath sounds: Normal breath sounds.  Abdominal:     General: Bowel sounds are normal.     Palpations: Abdomen is soft.  Skin:    General: Skin is warm and dry.     Comments: There is a moist appearing excoriating rash around the umbilicus likely eczema versus a fungal infection  Neurological:     Mental Status: She is alert and oriented to person, place, and time.      CMP Latest Ref Rng & Units 02/11/2019  Glucose 70 - 99 mg/dL 124(H)  BUN 8 - 23 mg/dL 17  Creatinine 0.44 - 1.00 mg/dL 0.87  Sodium 135 - 145 mmol/L 137  Potassium 3.5 - 5.1 mmol/L 4.1  Chloride 98 - 111 mmol/L 103  CO2 22 - 32 mmol/L 28  Calcium 8.9 - 10.3 mg/dL 9.1  Total Protein 6.5 - 8.1 g/dL 7.4  Total  Bilirubin 0.3 - 1.2 mg/dL 0.6  Alkaline Phos 38 - 126 U/L 63  AST 15 - 41 U/L 16  ALT 0 - 44 U/L 13   CBC Latest Ref Rng & Units 12/10/2018  WBC 4.0 - 10.5 K/uL 5.0  Hemoglobin 12.0 - 15.0 g/dL 13.3  Hematocrit 36.0 - 46.0 % 42.5  Platelets 150 - 400 K/uL 162      Assessment and plan- Patient is a 85 y.o. female with h/o 3 recurrent breast cancer most recent one in the left breast in April 2019 ER PR positive.    This is a routine follow-up of breast cancer  Clinically patient is doing well and no signs and symptoms concerning for recurrence.  Recent mammogram from May 2020 showed no evidence of malignancy in the right breast.  She is status post left mastectomy.  I will see her back in 6 months with no labs  Suspect rash around umbilicus is due to eczema secondary to excessive sweating in the area versus a possible fungal rash.  I have asked her to keep the area dry and consider over-the-counter nystatin powder.  If the rash does not get better she will get in touch with her primary care doctor   Visit Diagnosis 1. Encounter for follow-up surveillance of breast cancer      Dr. Randa Evens, MD, MPH [facility] [phone] 06/17/2019 8:38 AM

## 2019-07-05 ENCOUNTER — Encounter: Payer: Self-pay | Admitting: General Surgery

## 2019-11-02 IMAGING — MG US BREAST BX W LOC DEV 1ST LESION IMG BX SPEC US GUIDE*L*
1 series · 8 of 8 positions shown · non-contrast
Comparison: Previous exam(s).

ADDENDUM:
PATHOLOGY ADDENDUM:

Pathology :
A.  LEFT BREAST, [DATE], 3 CMFN; ULTRASOUND-GUIDED BIOPSY:
- INVASIVE MAMMARY CARCINOMA, NO SPECIAL TYPE.
Size of invasive carcinoma: 5 mm in this sample
Histologic grade of invasive carcinoma: Grade 2
Ductal carcinoma in situ: Present
Lymphovascular invasion: Not identified
ER/PR/HER2: Immunohistochemistry will be performed on block A1, with
reflex to FISH for HER2 2+. The results will be reported in an
addendum.
Comment:
The definitive grade will be assigned on the excisional specimen.
The diagnosis was called to [place] [facility]
on 03/14/18 at 415 PM.
Pathology concordance with imaging findings: Yes
Recommendation: Consultation were treatment plan
At the request of the patient, I spoke with her by telephone on
03/14/2018 at [DATE]. She reports doing well after the biopsy .
CLINICAL DATA: Patient presents for ultrasound-guided core biopsy
of mass in the 3 o'clock location of the LEFT breast.
EXAM:
ULTRASOUND GUIDED LEFT BREAST CORE NEEDLE BIOPSY

[Series 1: MG view · 0.06mm/px · 8 of 10 slices shown]
[im 1/10]
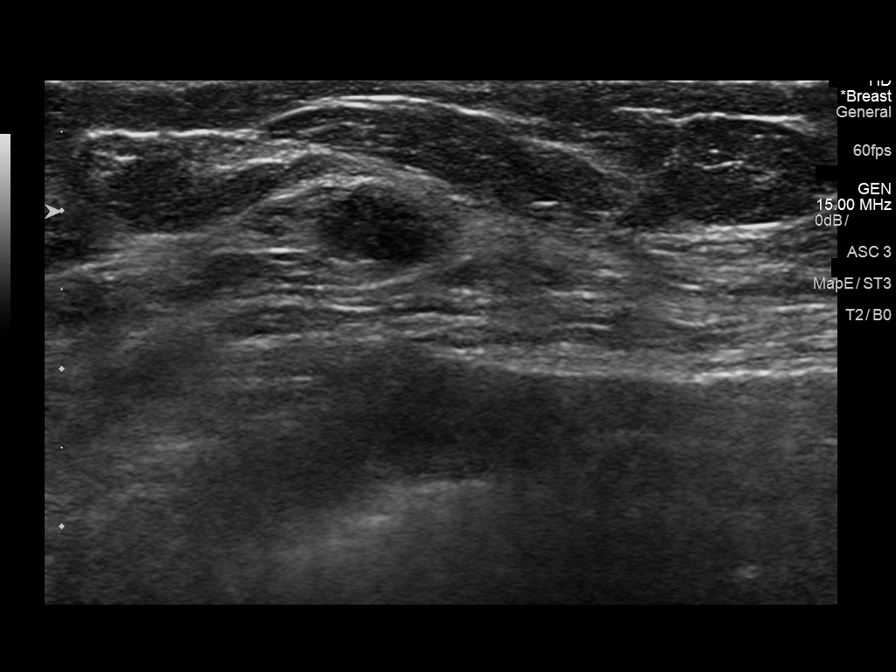
[im 2/10]
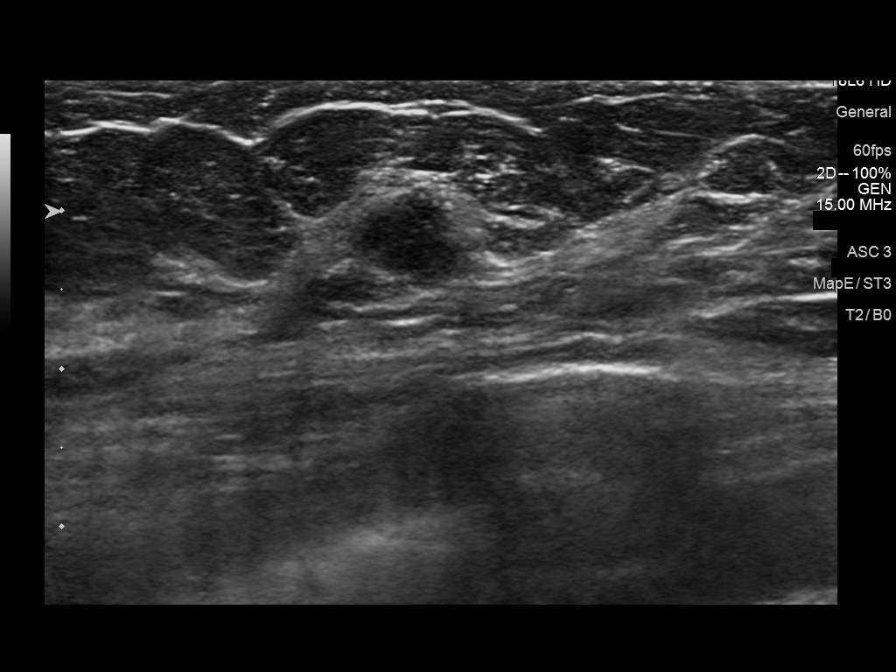
[im 3/10]
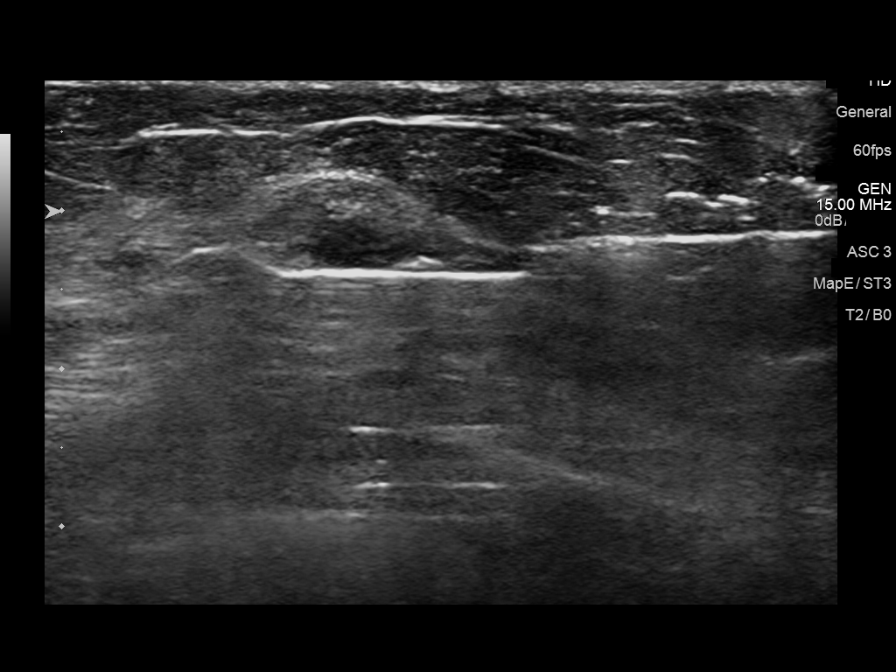
[im 4/10]
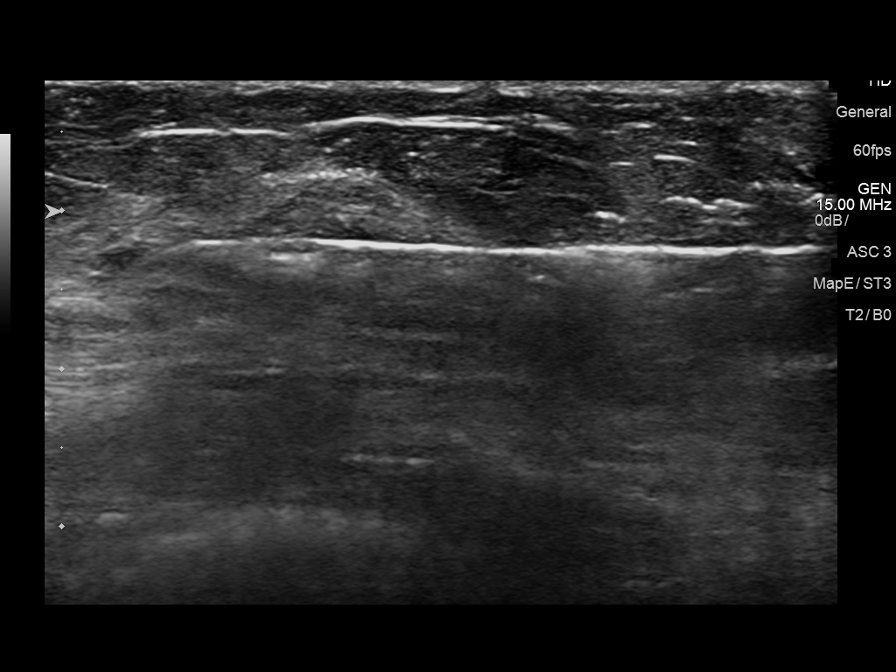
[im 6/10]
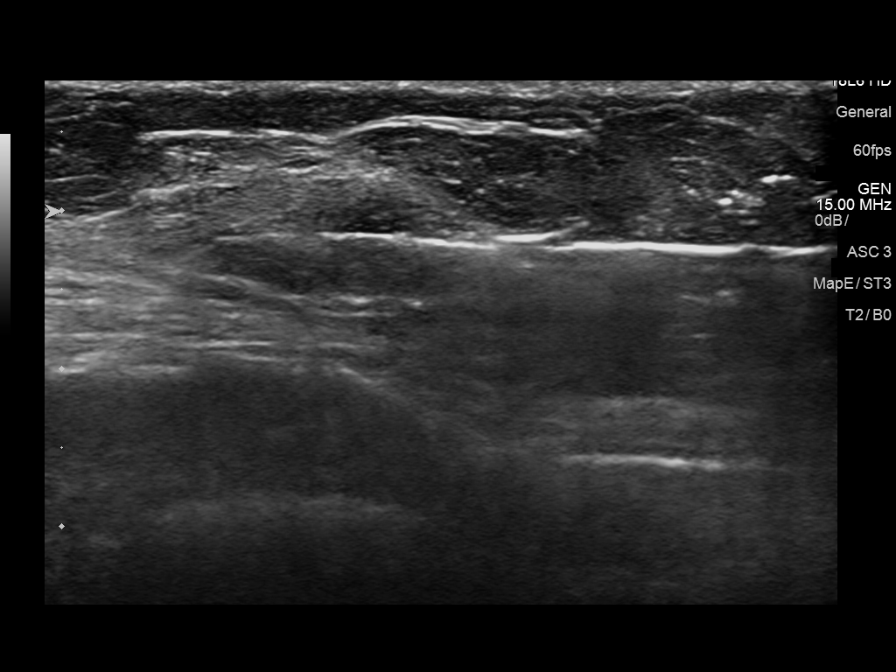
[im 7/10]
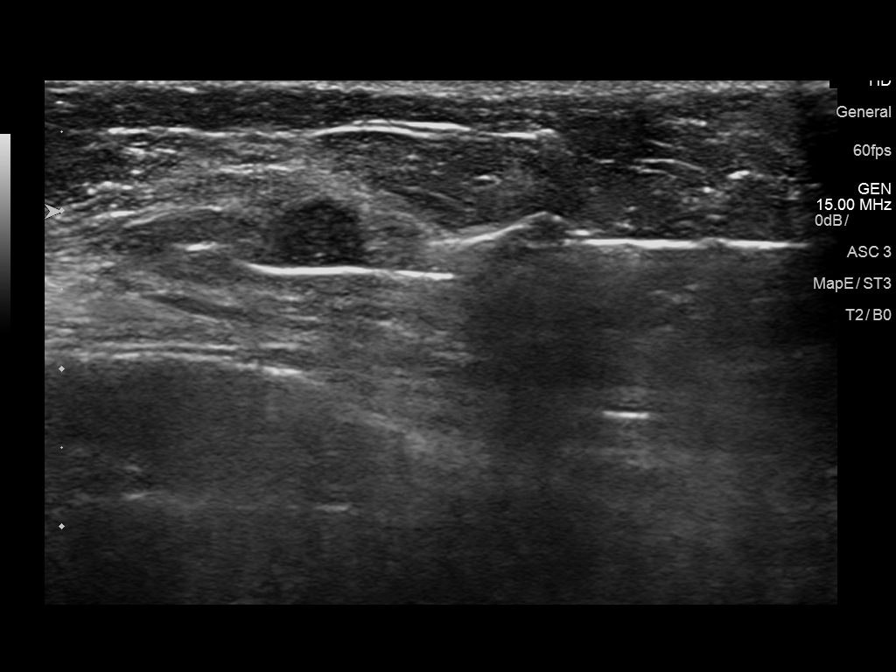
[im 8/10]
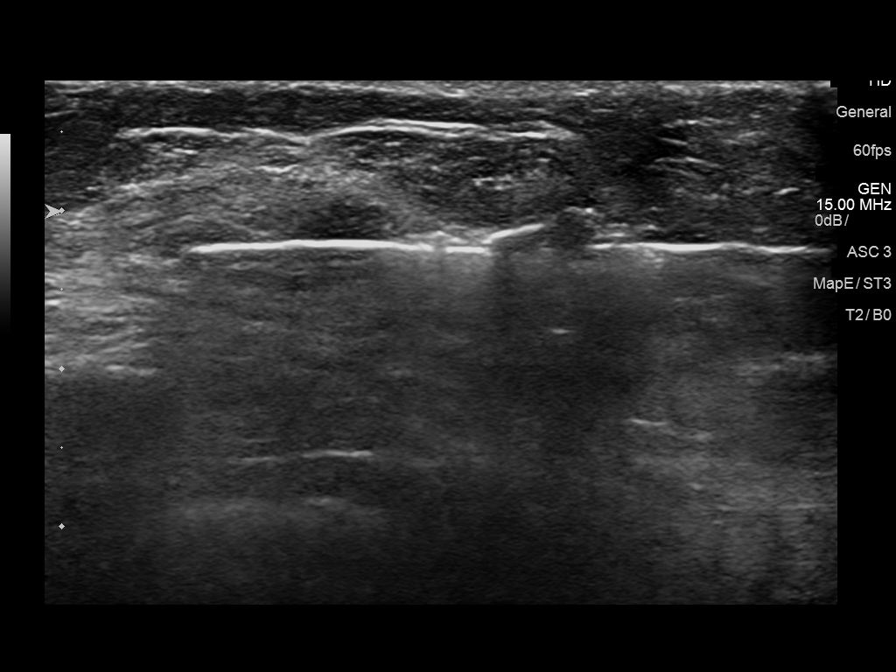
[im 10/10]
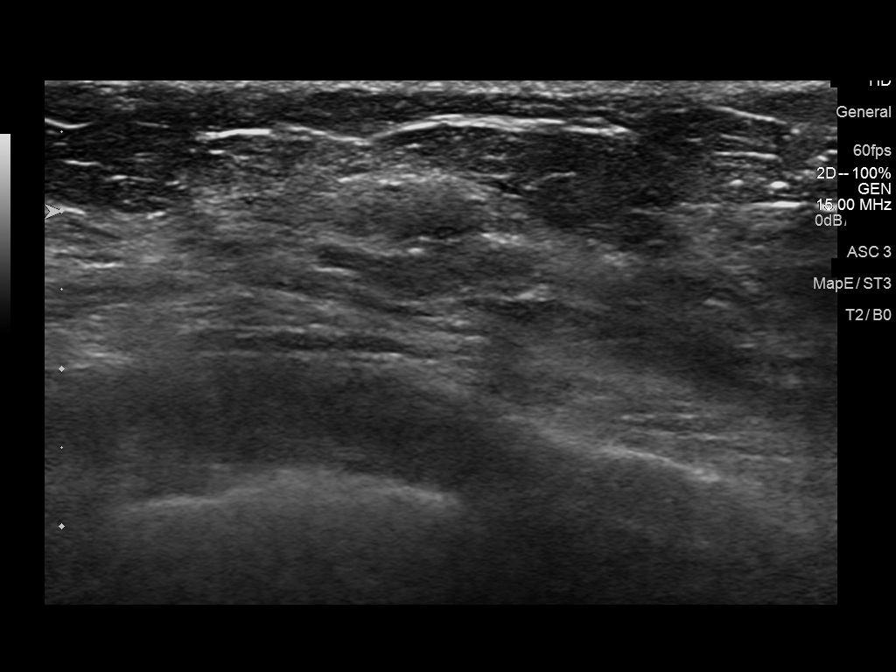

[8 of 8 positions shown; findings below may reference images not displayed]



Lesion quadrant: LATERAL LEFT breast

Using sterile technique and 1% Lidocaine as local anesthetic, under
direct ultrasound visualization, a 12 gauge Adrienne device was
used to perform biopsy of mass in the 3 o'clock location of the LEFT
breast using a LATERAL approach. At the conclusion of the procedure
a heart shaped tissue marker clip was deployed into the biopsy
cavity. Follow up 2 view mammogram was performed and dictated
separately.
IMPRESSION: Ultrasound guided biopsy of LEFT breast mass. No apparent
complications.

## 2019-12-16 ENCOUNTER — Other Ambulatory Visit: Payer: Self-pay

## 2019-12-16 NOTE — Progress Notes (Signed)
Patient pre screened for office appointment, no questions or concerns today. Patient reminded of upcoming appointment time and date. 

## 2019-12-17 ENCOUNTER — Other Ambulatory Visit: Payer: Self-pay

## 2019-12-17 ENCOUNTER — Inpatient Hospital Stay: Payer: Medicare Other | Attending: Oncology | Admitting: Oncology

## 2019-12-17 VITALS — BP 144/85 | HR 82 | Temp 98.3°F | Ht 67.0 in | Wt 138.5 lb

## 2019-12-17 DIAGNOSIS — Z9012 Acquired absence of left breast and nipple: Secondary | ICD-10-CM | POA: Diagnosis not present

## 2019-12-17 DIAGNOSIS — Z79899 Other long term (current) drug therapy: Secondary | ICD-10-CM | POA: Diagnosis not present

## 2019-12-17 DIAGNOSIS — Z87891 Personal history of nicotine dependence: Secondary | ICD-10-CM | POA: Insufficient documentation

## 2019-12-17 DIAGNOSIS — Z170 Estrogen receptor positive status [ER+]: Secondary | ICD-10-CM | POA: Insufficient documentation

## 2019-12-17 DIAGNOSIS — Z8249 Family history of ischemic heart disease and other diseases of the circulatory system: Secondary | ICD-10-CM | POA: Diagnosis not present

## 2019-12-17 DIAGNOSIS — M810 Age-related osteoporosis without current pathological fracture: Secondary | ICD-10-CM | POA: Diagnosis not present

## 2019-12-17 DIAGNOSIS — Z8261 Family history of arthritis: Secondary | ICD-10-CM | POA: Diagnosis not present

## 2019-12-17 DIAGNOSIS — Z803 Family history of malignant neoplasm of breast: Secondary | ICD-10-CM | POA: Insufficient documentation

## 2019-12-17 DIAGNOSIS — C50912 Malignant neoplasm of unspecified site of left female breast: Secondary | ICD-10-CM | POA: Insufficient documentation

## 2019-12-17 DIAGNOSIS — Z7982 Long term (current) use of aspirin: Secondary | ICD-10-CM | POA: Insufficient documentation

## 2019-12-17 DIAGNOSIS — K589 Irritable bowel syndrome without diarrhea: Secondary | ICD-10-CM | POA: Insufficient documentation

## 2019-12-17 DIAGNOSIS — Z90710 Acquired absence of both cervix and uterus: Secondary | ICD-10-CM | POA: Insufficient documentation

## 2019-12-17 DIAGNOSIS — Z08 Encounter for follow-up examination after completed treatment for malignant neoplasm: Secondary | ICD-10-CM

## 2019-12-17 DIAGNOSIS — E039 Hypothyroidism, unspecified: Secondary | ICD-10-CM | POA: Insufficient documentation

## 2019-12-17 DIAGNOSIS — Z923 Personal history of irradiation: Secondary | ICD-10-CM | POA: Insufficient documentation

## 2019-12-17 DIAGNOSIS — Z853 Personal history of malignant neoplasm of breast: Secondary | ICD-10-CM | POA: Insufficient documentation

## 2019-12-17 NOTE — Progress Notes (Signed)
Patient stated that she has had been having some soreness on her left axilla and chest. Patient also stated that her right breast feel tender at times. Patient stated that she has had itching on her mid back that had been there for several months. Patient's last mammogram was on 04/22/2019 and bone density on 05/25/2017.

## 2019-12-19 ENCOUNTER — Encounter: Payer: Self-pay | Admitting: Oncology

## 2019-12-19 NOTE — Progress Notes (Signed)
Hematology/Oncology Consult note [facility]  [phone] [phone]  Patient Care Team: Adin Hector, MD as PCP - General (Internal Medicine) Lequita Asal, MD as Referring Physician (Hematology and Oncology) Bary Castilla Forest Gleason, MD (General Surgery)   Name of the patient: Aimee Gutierrez  [id number]  1933-05-09   Date of visit: 12/19/19  Diagnosis- history of recurrent breast cancer  Chief complaint/ Reason for visit-routine follow-up of breast cancer  Heme/Onc history: Patient is a 86 year old female who has had 3 prior breast cancers. The first 1 was in April 2013 T1 a N0 M0 ER PR positive status post lumpectomy and radiation treatment. Patient declined hormone therapy. 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 She had rightbreast cancer in April 2016 which showed 3.5 mm grade 2 invasive carcinoma with DCIS ER PR positive HER-2/neu negative completed radiation but declined hormone therapy. In April 2019 she was found to have stage I left breast cancer 8 mm grade 2 ER PR positive HER-2/neu negative. She underwent a left mastectomy with sentinel lymph node biopsy. Did not require adjuvant radiation treatment but has not started any hormone therapy yet.  Patient was initially seen by Dr. Mike Gip. She underwent a bone density scan which showed osteoporosis and therefore tamoxifen was preferred over AI.  Patient ultimately decided not to pursue any hormone therapy and is currently off tamoxifen and AI.   Interval history-patient reports doing very well and denies any complaints at this time.  ECOG PS- 1 Pain scale- 0 Opioid associated constipation- no  Review of systems- Review of Systems  Constitutional: Negative for chills, fever, malaise/fatigue and weight loss.  HENT: Negative for congestion, ear discharge and nosebleeds.   Eyes: Negative for  blurred vision.  Respiratory: Negative for cough, hemoptysis, sputum production, shortness of breath and wheezing.   Cardiovascular: Negative for chest pain, palpitations, orthopnea and claudication.  Gastrointestinal: Negative for abdominal pain, blood in stool, constipation, diarrhea, heartburn, melena, nausea and vomiting.  Genitourinary: Negative for dysuria, flank pain, frequency, hematuria and urgency.  Musculoskeletal: Negative for back pain, joint pain and myalgias.  Skin: Negative for rash.  Neurological: Negative for dizziness, tingling, focal weakness, seizures, weakness and headaches.  Endo/Heme/Allergies: Does not bruise/bleed easily.  Psychiatric/Behavioral: Negative for depression and suicidal ideas. The patient does not have insomnia.       Allergies  Allergen Reactions  . Nitrofurantoin Nausea Only  . Latex Rash    Latex IgE was NEGATIVE (<0.10)  . Levothyroxine Nausea Only    Shaking, nausea, hypertension-can tolerate Synthroid  . Sulfa Antibiotics Rash     Past Medical History:  Diagnosis Date  . Arthritis   . Breast cancer ([place]) 2013   left breast, radiation  . Breast cancer of lower-outer quadrant of right female breast ([place]) 03/09/2015   3.5 mm invasive carcinoma, low-grade DCIS. ER 90%, PR greater than 50%, HER-2/neu not amplified. Axillary sentinel node and radiation deferred based on age..  . Breast cancer, left ([place]) 03/26/2018   8 mm recurrent invasive mammary carcinoma, sentinel node negative.  ER/PR positive, HER-2/neu not overexpressed.  RpT1b rpN0(sn)   . Environmental allergies   . GERD (gastroesophageal reflux disease)   . Hypothyroidism   . IBS (irritable bowel syndrome)    in the past, diverticulitis in the past.  . Osteoporosis   . Personal history of radiation therapy 2016  . Personal history of  radiation therapy 2013     Past Surgical History:  Procedure Laterality Date  . ABDOMINAL HYSTERECTOMY     1969  . BREAST BIOPSY Right  02/17/15   positive  . BREAST BIOPSY Left 03/13/2018   [place] core bx, INVASIVE MAMMARY CARCINOMA  . BREAST LUMPECTOMY Right 2016  . BREAST LUMPECTOMY Left 2013  . BREAST SURGERY Left 2013   [place] Right 03/09/15   wide excision  . CATARACT EXTRACTION  2013, 2015  . JOINT REPLACEMENT     right knee  . KNEE ARTHROPLASTY Left 07/04/2018   Procedure: COMPUTER ASSISTED TOTAL KNEE ARTHROPLASTY;  Surgeon: Dereck Leep, MD;  Location: [facility] ORS;  Service: Orthopedics;  Laterality: Left;  Marland Kitchen MASTECTOMY Left 2019   INVASIVE MAMMARY CARCINOMA   . OVARY SURGERY  2001   benign  . SENTINEL NODE BIOPSY Left 03/26/2018   Procedure: SENTINEL NODE BIOPSY;  Surgeon: Robert Bellow, MD;  Location: [facility] ORS;  Service: General;  Laterality: Left;  . SIMPLE MASTECTOMY WITH AXILLARY SENTINEL NODE BIOPSY Left 03/26/2018   Procedure: SIMPLE MASTECTOMY;  Surgeon: Robert Bellow, MD;  Location: [facility] ORS;  Service: General;  Laterality: Left;  . TOTAL KNEE ARTHROPLASTY Right 10/12/2016   Procedure: TOTAL KNEE ARTHROPLASTY;  Surgeon: Dereck Leep, MD;  Location: [facility] ORS;  Service: Orthopedics;  Laterality: Right;  . VENTRAL HERNIA REPAIR N/A 02/01/2018   Procedure: HERNIA REPAIR VENTRAL ADULT;  Surgeon: Herbert Pun, MD;  Location: [facility] ORS;  Service: General;  Laterality: N/A;    Social History   Socioeconomic History  . Marital status: Widowed    Spouse name: Not on file  . Number of children: Not on file  . Years of education: Not on file  . Highest education level: Not on file  Occupational History  . Not on file  Tobacco Use  . Smoking status: Former Smoker    Quit date: 12/05/2009    Years since quitting: 10.0  . Smokeless tobacco: Never Used  Substance and Sexual Activity  . Alcohol use: No    Alcohol/week: 0.0 standard drinks    Comment: rare glass of wine  . Drug use: No  . Sexual activity: Never  Other Topics Concern  . Not on file  Social History  Narrative  . Not on file   Social Determinants of Health   Financial Resource Strain:   . Difficulty of Paying Living Expenses: Not on file  Food Insecurity:   . Worried About Charity fundraiser in the Last Year: Not on file  . Ran Out of Food in the Last Year: Not on file  Transportation Needs:   . Lack of Transportation (Medical): Not on file  . Lack of Transportation (Non-Medical): Not on file  Physical Activity:   . Days of Exercise per Week: Not on file  . Minutes of Exercise per Session: Not on file  Stress:   . Feeling of Stress : Not on file  Social Connections:   . Frequency of Communication with Friends and Family: Not on file  . Frequency of Social Gatherings with Friends and Family: Not on file  . Attends Religious Services: Not on file  . Active Member of Clubs or Organizations: Not on file  . Attends Archivist Meetings: Not on file  . Marital Status: Not on file  Intimate Partner Violence:   . Fear of Current or Ex-Partner: Not on file  . Emotionally Abused: Not on file  .  Physically Abused: Not on file  . Sexually Abused: Not on file    Family History  Problem Relation Age of Onset  . Arthritis Mother   . Heart disease Father   . Alzheimer's disease Sister   . Breast cancer Paternal Grandmother      Current Outpatient Medications:  .  acetaminophen (TYLENOL) 500 MG tablet, Take 500 mg by mouth 3 (three) times daily as needed for moderate pain (as needed). , Disp: , Rfl:  .  aspirin EC 81 MG tablet, Take by mouth., Disp: , Rfl:  .  azelastine (ASTELIN) 0.1 % nasal spray, Place into the nose., Disp: , Rfl:  .  calcium carbonate (TUMS EX) 750 MG chewable tablet, Chew 2 tablets by mouth at bedtime. , Disp: , Rfl:  .  Camphor-Eucalyptus-Menthol (VICKS VAPORUB EX), Apply 1 application topically daily as needed (applied to legs)., Disp: , Rfl:  .  Cholecalciferol (VITAMIN D) 2000 units tablet, Take 4,000 Units by mouth at bedtime. , Disp: , Rfl:  .   Docusate Sodium (COLACE PO), Take 1 capsule by mouth., Disp: , Rfl:  .  Lactobacillus (FLORAJEN ACIDOPHILUS) CAPS, Take 1 capsule by mouth every other day. At night, Disp: , Rfl:  .  omeprazole (PRILOSEC) 20 MG capsule, Take 1 capsule (20 mg total) by mouth daily before breakfast., Disp: 90 capsule, Rfl: 0 .  OVER THE COUNTER MEDICATION, Place 1 application into both ears daily. EARGENE Soothing Ear Lotion--applied prior to placing hearing aids, Disp: , Rfl:  .  Probiotic Product (PROBIOTIC-10 PO), Take 1 tablet by mouth daily., Disp: , Rfl:  .  SYNTHROID 88 MCG tablet, Take 88 mcg by mouth daily., Disp: , Rfl:   Physical exam:  Vitals:   12/17/19 0955  BP: (!) 144/85  Pulse: 82  Temp: 98.3 F (36.8 C)  TempSrc: Tympanic  Weight: 138 lb 8 oz (62.8 kg)  Height: '5\' 7"'$  (1.702 m)   Physical Exam HENT:     Head: Normocephalic and atraumatic.  Eyes:     Pupils: Pupils are equal, round, and reactive to light.  Cardiovascular:     Rate and Rhythm: Normal rate and regular rhythm.     Heart sounds: Normal heart sounds.  Pulmonary:     Effort: Pulmonary effort is normal.     Breath sounds: Normal breath sounds.  Abdominal:     General: Bowel sounds are normal.     Palpations: Abdomen is soft.  Musculoskeletal:     Cervical back: Normal range of motion.  Skin:    General: Skin is warm and dry.  Neurological:     Mental Status: She is alert and oriented to person, place, and time.   Patient is s/p left mastectomy without reconstruction.  No evidence of chest wall recurrence.  No palpable masses in the right breast.  She is s/p right lumpectomy.  No palpable bilateral axillary adenopathy.  CMP Latest Ref Rng & Units 02/11/2019  Glucose 70 - 99 mg/dL 124(H)  BUN 8 - 23 mg/dL 17  Creatinine 0.44 - 1.00 mg/dL 0.87  Sodium 135 - 145 mmol/L 137  Potassium 3.5 - 5.1 mmol/L 4.1  Chloride 98 - 111 mmol/L 103  CO2 22 - 32 mmol/L 28  Calcium 8.9 - 10.3 mg/dL 9.1  Total Protein 6.5 - 8.1 g/dL  7.4  Total Bilirubin 0.3 - 1.2 mg/dL 0.6  Alkaline Phos 38 - 126 U/L 63  AST 15 - 41 U/L 16  ALT 0 - 44 U/L  13   CBC Latest Ref Rng & Units 12/10/2018  WBC 4.0 - 10.5 K/uL 5.0  Hemoglobin 12.0 - 15.0 g/dL 13.3  Hematocrit 36.0 - 46.0 % 42.5  Platelets 150 - 400 K/uL 162      Assessment and plan- Patient is a 86 y.o. female  with h/o 3 recurrent breast cancer most recent one in the left breast in April 2019 ER PR positive.   She is currently not on any hormone therapy and this is a routine follow-up visit for breast cancer.  Clinically patient is doing well and does not report any concerning signs and symptoms suspicious for recurrence.  Her appetite is good and her weight is stable.  Breast exam is unremarkable.  She will be due for right breast mammogram in May 2021.  She is in the process of moving to [facility].  I recommended that she should establish care at [facility] health there to continue her follow-up for breast cancer.  Patient is agreeable.  She will get a mammogram through them when she moves over there.   Visit Diagnosis 1. Encounter for follow-up surveillance of breast cancer      Dr. Randa Evens, MD, MPH [facility] at [facility] [phone] 12/19/2019 12:17 PM

## 2020-02-23 IMAGING — DX DG KNEE 1-2V PORT*L*
2 series · 2 of 2 positions shown · non-contrast
Comparison: None.

CLINICAL DATA: Postop

EXAM:
PORTABLE LEFT KNEE - 1-2 VIEW

[knee ap]
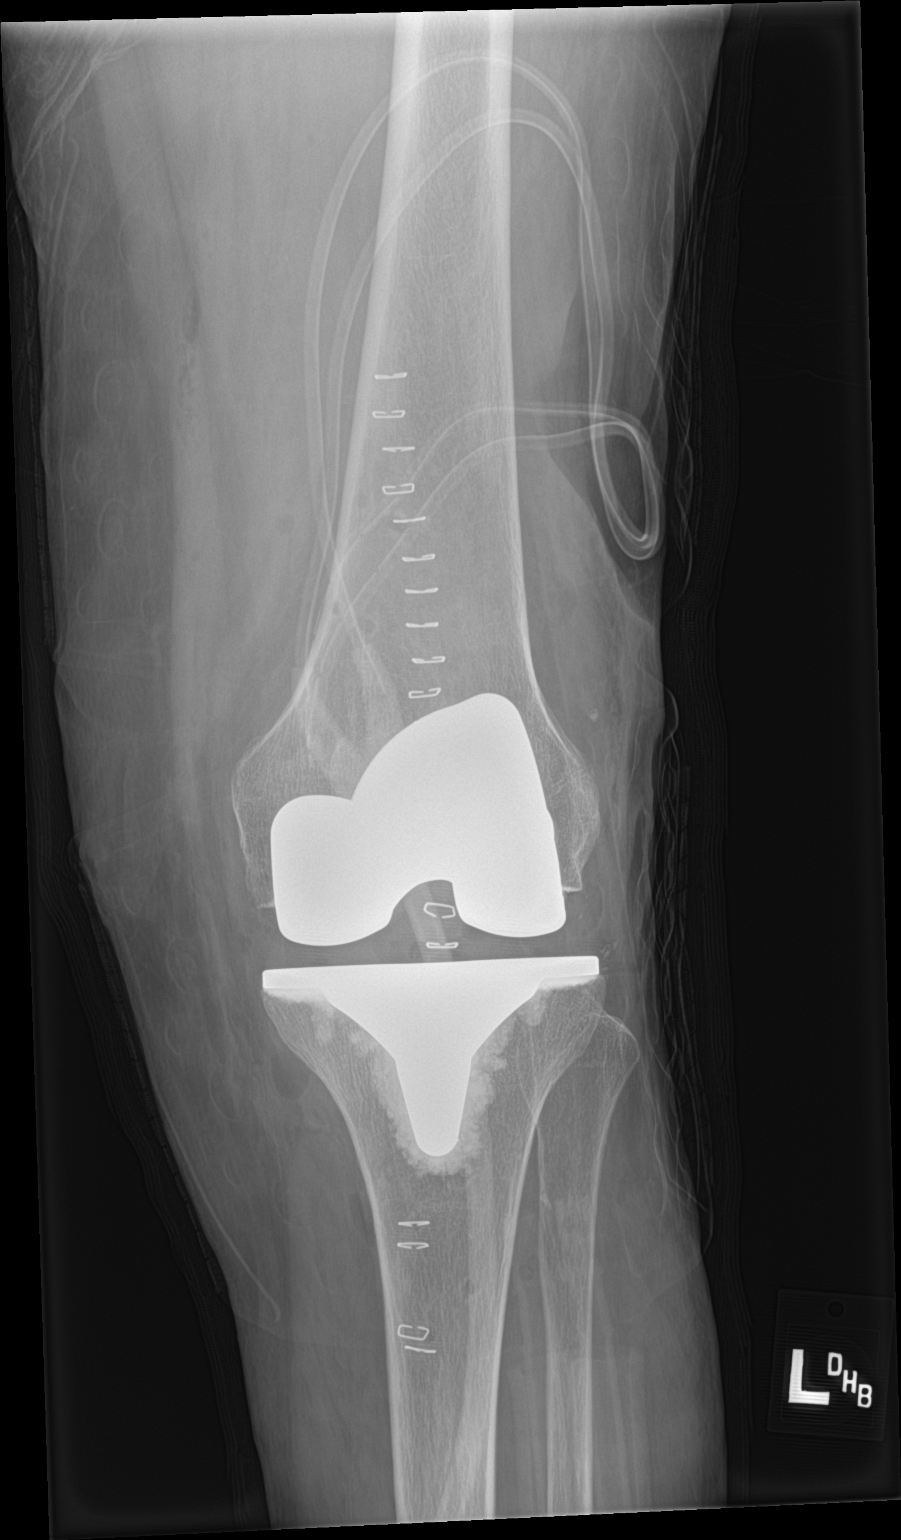

[knee lat]
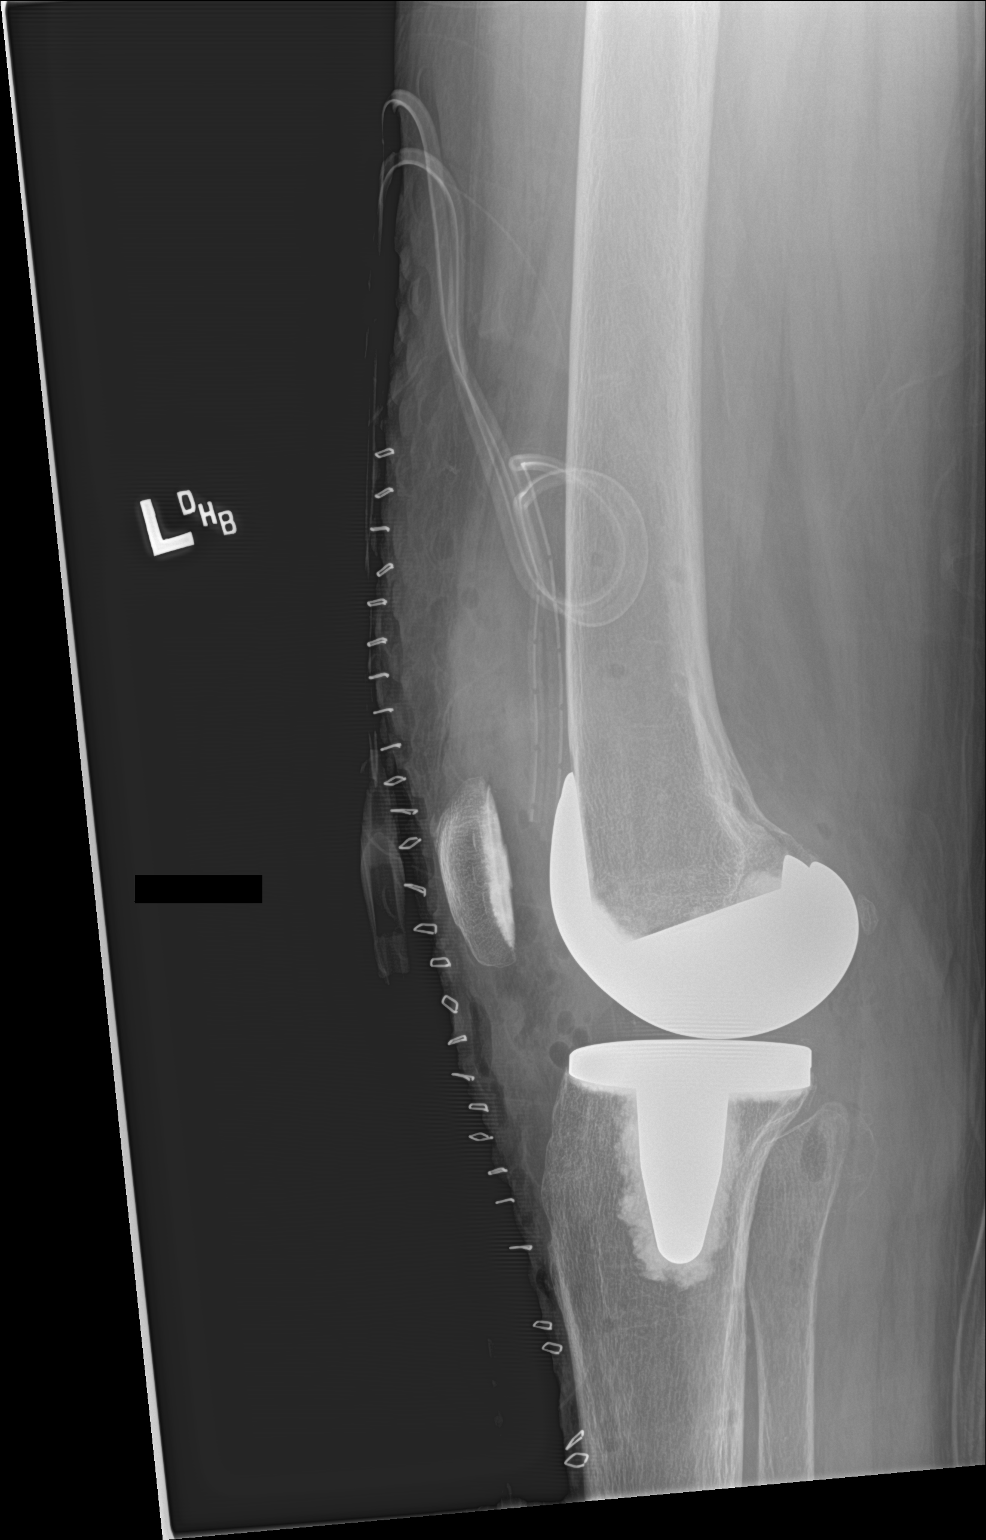

[2 of 2 positions shown; findings below may reference images not displayed]

FINDINGS: Cutaneous staples. Surgical drain in the suprapatellar soft tissues.
Status post left knee replacement with normal alignment. No
fracture. Gas in the soft tissues consistent with recent operative
status.
IMPRESSION: Status post left knee replacement with expected postsurgical
changes.
